# Patient Record
Sex: Female | Born: 1935 | Race: Black or African American | Hispanic: No | Smoking: Never smoker
Health system: Southern US, Community
[De-identification: ages and names within clinical notes are randomized; demographics above are authoritative.]

## PROBLEM LIST (undated history)

## (undated) DIAGNOSIS — J9692 Respiratory failure, unspecified with hypercapnia: Secondary | ICD-10-CM

## (undated) DIAGNOSIS — E785 Hyperlipidemia, unspecified: Secondary | ICD-10-CM

## (undated) DIAGNOSIS — E538 Deficiency of other specified B group vitamins: Secondary | ICD-10-CM

## (undated) DIAGNOSIS — R41841 Cognitive communication deficit: Secondary | ICD-10-CM

## (undated) DIAGNOSIS — R278 Other lack of coordination: Secondary | ICD-10-CM

## (undated) DIAGNOSIS — N183 Chronic kidney disease, stage 3 unspecified: Secondary | ICD-10-CM

## (undated) DIAGNOSIS — J9691 Respiratory failure, unspecified with hypoxia: Secondary | ICD-10-CM

## (undated) DIAGNOSIS — I4891 Unspecified atrial fibrillation: Secondary | ICD-10-CM

## (undated) DIAGNOSIS — R2681 Unsteadiness on feet: Secondary | ICD-10-CM

## (undated) DIAGNOSIS — D649 Anemia, unspecified: Secondary | ICD-10-CM

## (undated) DIAGNOSIS — M6281 Muscle weakness (generalized): Secondary | ICD-10-CM

## (undated) DIAGNOSIS — J9600 Acute respiratory failure, unspecified whether with hypoxia or hypercapnia: Secondary | ICD-10-CM

## (undated) DIAGNOSIS — R1310 Dysphagia, unspecified: Secondary | ICD-10-CM

## (undated) DIAGNOSIS — I10 Essential (primary) hypertension: Secondary | ICD-10-CM

## (undated) DIAGNOSIS — I5033 Acute on chronic diastolic (congestive) heart failure: Secondary | ICD-10-CM

## (undated) DIAGNOSIS — E559 Vitamin D deficiency, unspecified: Secondary | ICD-10-CM

## (undated) HISTORY — PX: COLOSTOMY: SHX63

---

## 2016-01-08 ENCOUNTER — Emergency Department (HOSPITAL_BASED_OUTPATIENT_CLINIC_OR_DEPARTMENT_OTHER): Payer: Medicare Other

## 2016-01-08 ENCOUNTER — Emergency Department (HOSPITAL_BASED_OUTPATIENT_CLINIC_OR_DEPARTMENT_OTHER)
Admission: EM | Admit: 2016-01-08 | Discharge: 2016-01-08 | Disposition: A | Discharge status: Home / Self Care | Payer: Medicare Other | Attending: Emergency Medicine | Admitting: Emergency Medicine

## 2016-01-08 ENCOUNTER — Encounter (HOSPITAL_BASED_OUTPATIENT_CLINIC_OR_DEPARTMENT_OTHER): Payer: Self-pay | Admitting: *Deleted

## 2016-01-08 DIAGNOSIS — Y999 Unspecified external cause status: Secondary | ICD-10-CM | POA: Insufficient documentation

## 2016-01-08 DIAGNOSIS — S50311A Abrasion of right elbow, initial encounter: Secondary | ICD-10-CM | POA: Diagnosis not present

## 2016-01-08 DIAGNOSIS — Y929 Unspecified place or not applicable: Secondary | ICD-10-CM | POA: Insufficient documentation

## 2016-01-08 DIAGNOSIS — Y9301 Activity, walking, marching and hiking: Secondary | ICD-10-CM | POA: Insufficient documentation

## 2016-01-08 DIAGNOSIS — R51 Headache: Secondary | ICD-10-CM | POA: Diagnosis not present

## 2016-01-08 DIAGNOSIS — W19XXXA Unspecified fall, initial encounter: Secondary | ICD-10-CM

## 2016-01-08 DIAGNOSIS — R52 Pain, unspecified: Secondary | ICD-10-CM

## 2016-01-08 DIAGNOSIS — M25552 Pain in left hip: Secondary | ICD-10-CM | POA: Diagnosis not present

## 2016-01-08 DIAGNOSIS — I10 Essential (primary) hypertension: Secondary | ICD-10-CM | POA: Insufficient documentation

## 2016-01-08 DIAGNOSIS — W1839XA Other fall on same level, initial encounter: Secondary | ICD-10-CM | POA: Diagnosis not present

## 2016-01-08 DIAGNOSIS — Z79899 Other long term (current) drug therapy: Secondary | ICD-10-CM | POA: Insufficient documentation

## 2016-01-08 DIAGNOSIS — I2510 Atherosclerotic heart disease of native coronary artery without angina pectoris: Secondary | ICD-10-CM | POA: Insufficient documentation

## 2016-01-08 DIAGNOSIS — M25521 Pain in right elbow: Secondary | ICD-10-CM | POA: Diagnosis present

## 2016-01-08 HISTORY — DX: Essential (primary) hypertension: I10

## 2016-01-08 LAB — PROTIME-INR
INR: 2.23 — AB (ref 0.00–1.49)
Prothrombin Time: 24.5 seconds — ABNORMAL HIGH (ref 11.6–15.2)

## 2016-01-08 NOTE — ED Notes (Signed)
She lost her balance and fell backward hitting the back of her head on concrete. No loc. She also hit her right elbow. Small abrasion and pain.

## 2016-01-08 NOTE — ED Notes (Signed)
Pt didn't make it far out of the room without her sats dropping to high 70's/low 80's. Pulse jumped up to around 120's as well.

## 2016-01-08 NOTE — ED Notes (Signed)
Patient transported to CT and xray 

## 2016-01-08 NOTE — ED Provider Notes (Signed)
CSN: [id number]     Arrival date & time 01/08/16  1737 History  By signing my name below, I, Emily Elliott, attest that this documentation has been prepared under the direction and in the presence of Emily Memos, MD. Electronically Signed: Bridgette Elliott, ED Scribe. 01/08/2016. 6:50 PM.   Chief Complaint  Patient presents with  . Fall   The history is provided by the patient and a relative. No language interpreter was used.    HPI Comments: Emily Elliott is a 82 y.o. female who presents to the Emergency Department complaining of sudden onset, constant, right elbow pain s/p mechanical fall around 5 pm today. Per relative, patient was walking with her walker when she lost balance and fell backwards onto concrete. Patient hit the back of her head and her right elbow upon falling. No LOC. She was having an occiput headache after the fall that has since resolved. No alleviating factors noted. Pt also reports that her left hip hurts but no more than baseline. Pt is on Coumadin for A-fib. No additional injuries from the fall. Denies neck pain, back pain, or any other associated symptoms.   Past Medical History  Diagnosis Date  . Hypertension   . Coronary artery disease    History reviewed. No pertinent past surgical history. No family history on file. Social History  Substance Use Topics  . Smoking status: Never Smoker   . Smokeless tobacco: None  . Alcohol Use: No   OB History    No data available     Review of Systems  Musculoskeletal: Positive for myalgias (hip (baseline)) and arthralgias (right elbow). Negative for back pain and neck pain.  Neurological: Positive for headaches (resolved). Negative for syncope.  All other systems reviewed and are negative.   Allergies  Benadryl  Home Medications   Prior to Admission medications   Medication Sig Start Date End Date Taking? Authorizing Provider  AMLODIPINE BESYLATE PO Take by mouth.   Yes Historical Provider, MD  Furosemide (LASIX PO)  Take by mouth.   Yes Historical Provider, MD  LOSARTAN POTASSIUM PO Take by mouth.   Yes Historical Provider, MD  SIMVASTATIN PO Take by mouth.   Yes Historical Provider, MD  Warfarin Sodium (COUMADIN PO) Take by mouth.   Yes Historical Provider, MD   BP 149/83 mmHg  Pulse 97  Temp(Src) 99.3 F (37.4 C) (Oral)  Resp 18  Ht 5\' 3"  (1.6 m)  Wt 145 lb (65.772 kg)  BMI 25.69 kg/m2  SpO2 95%   Physical Exam  Constitutional: She is oriented to person, place, and time. She appears well-developed and well-nourished.  HENT:  Head: Normocephalic and atraumatic.  No step-offs or ecchymosis  Eyes: Conjunctivae are normal. Pupils are equal, round, and reactive to light.  Cardiovascular: Normal rate.   Pulmonary/Chest: Effort normal and breath sounds normal. No respiratory distress. She exhibits no tenderness.  Abdominal: She exhibits no distension.  Musculoskeletal: Normal range of motion. She exhibits tenderness. She exhibits no edema.       Cervical back: She exhibits no tenderness.  TTP on right elbow. Left hip TTP w/ ROM. No tenderness on rest of her bilateral upper and lower extremities. Small abrasion on right elbow.  Neurological: She is alert and oriented to person, place, and time. She has normal reflexes. No cranial nerve deficit.  Cranial nerves intact. Good sensation in distal lower extremities. 5/5 motor strength in upper extremities and normal sensation. Good sensation with dorsiflexion and plantarflexion of both feet. 1+  patellar deep tender reflexes, symmetric. 2+ bicep DTRs that are symmetric.   Skin: Skin is warm and dry.  Psychiatric: She has a normal mood and affect. Her behavior is normal.  Nursing note and vitals reviewed.   ED Course  Procedures (including critical care time) DIAGNOSTIC STUDIES: Oxygen Saturation is 100% on RA, normal by my interpretation.    COORDINATION OF CARE: 6:25 PM Discussed treatment plan with pt at bedside which includes head CT and x-ray and  pt agreed to plan.  Labs Review Labs Reviewed  PROTIME-INR - Abnormal; Notable for the following:    Prothrombin Time 24.5 (*)    INR 2.23 (*)    All other components within normal limits    Imaging Review Dg Pelvis 1-2 Views  01/08/2016  CLINICAL DATA:  Fall left hip pain. EXAM: PELVIS - 1-2 VIEW COMPARISON:  None. FINDINGS: No acute bony abnormality. Specifically, no fracture, subluxation, or dislocation. Soft tissues are intact. IMPRESSION: No acute bony abnormality. Electronically Signed   By: Charlett Nose M.D.   On: 01/08/2016 19:34   Dg Elbow Complete Right  01/08/2016  CLINICAL DATA:  Pain EXAM: RIGHT ELBOW - COMPLETE 3+ VIEW COMPARISON:  None. FINDINGS: Mild spurring within the right elbow. No acute bony abnormality. Specifically, no fracture, subluxation, or dislocation. Soft tissues are intact. No joint effusion IMPRESSION: No acute bony abnormality. Electronically Signed   By: Charlett Nose M.D.   On: 01/08/2016 19:33   Ct Head Wo Contrast  01/08/2016  CLINICAL DATA:  Pain following fall EXAM: CT HEAD WITHOUT CONTRAST TECHNIQUE: Contiguous axial images were obtained from the base of the skull through the vertex without intravenous contrast. COMPARISON:  None. FINDINGS: There is mild diffuse atrophy. There is no intracranial mass, hemorrhage, extra-axial fluid collection, or midline shift. There is small vessel disease in the centra semiovale bilaterally. There is also small vessel disease in the internal and external capsules bilaterally. No acute infarct is evident. Calcification is noted in the basal ganglia regions, physiologic in this age group. The bony calvarium appears intact. Visualized mastoid air cells are clear. Visualized orbits appear symmetric bilaterally. IMPRESSION: Atrophy with patchy supratentorial small vessel disease. No acute infarct evident. No intracranial mass, hemorrhage, or extra-axial fluid collection. Electronically Signed   By: Bretta Bang III M.D.   On:  01/08/2016 18:54   I have personally reviewed and evaluated these images and lab results as part of my medical decision-making.   MDM   Final diagnoses:  Fall, initial encounter    Mechanical fall with right elbow pain and left hip pain and initially having a headache but that has since resolved. However since she is on Coumadin CT of her head was done x-rays of pelvis and elbow were negative. INR checked per her son's request and this was normal. Patient able to pain. She did have a little bit of shortness of breath with slightly low saturations however this is her baseline per her family. She will follow up with primary doctor for further management. Return here for any new or worsening symptoms.  New Prescriptions: Discharge Medication List as of 01/08/2016  8:56 PM       I have personally and contemperaneously reviewed labs and imaging and used in my decision making as above.   A medical screening exam was performed and I feel the patient has had an appropriate workup for their chief complaint at this time and likelihood of emergent condition existing is low and thus workup can continue  on an outpatient basis.. Their vital signs are stable. They have been counseled on decision, discharge, follow up and which symptoms necessitate immediate return to the emergency department.  They verbally stated understanding and agreement with plan and discharged in stable condition.    I personally performed the services described in this documentation, which was scribed in my presence. The recorded information has been reviewed and is accurate.    Emily MemosJason Xandra Laramee, MD 01/08/16 [phone]

## 2016-05-10 ENCOUNTER — Ambulatory Visit (INDEPENDENT_AMBULATORY_CARE_PROVIDER_SITE_OTHER): Payer: Medicare Other | Admitting: Podiatry

## 2016-05-10 ENCOUNTER — Encounter: Payer: Self-pay | Admitting: Podiatry

## 2016-05-10 VITALS — BP 132/77 | HR 89 | Ht 64.0 in | Wt 149.0 lb

## 2016-05-10 DIAGNOSIS — M79673 Pain in unspecified foot: Secondary | ICD-10-CM | POA: Diagnosis not present

## 2016-05-10 DIAGNOSIS — M2040 Other hammer toe(s) (acquired), unspecified foot: Secondary | ICD-10-CM

## 2016-05-10 DIAGNOSIS — L97501 Non-pressure chronic ulcer of other part of unspecified foot limited to breakdown of skin: Secondary | ICD-10-CM | POA: Diagnosis not present

## 2016-05-10 DIAGNOSIS — M79671 Pain in right foot: Secondary | ICD-10-CM

## 2016-05-10 DIAGNOSIS — I739 Peripheral vascular disease, unspecified: Secondary | ICD-10-CM | POA: Diagnosis not present

## 2016-05-10 DIAGNOSIS — M79672 Pain in left foot: Secondary | ICD-10-CM

## 2016-05-10 DIAGNOSIS — B351 Tinea unguium: Secondary | ICD-10-CM

## 2016-05-10 NOTE — Patient Instructions (Signed)
Seen for painful corns and nails. Debrided and padded on 2nd toe left. May benefit from make opening to toe box in tennis shoes. Use antifungal cream after each bath, follow with Tinactin foot powder and cotton ball in between all affected digits. Return as needed.

## 2016-05-10 NOTE — Progress Notes (Signed)
Heart problem SUBJECTIVE: 82 y.o. year old female presents via wheel chair accompanied by her daughter with painful toes on both feet.   REVIEW OF SYSTEMS: A comprehensive review of systems was negative except for: Heart problem and wears colostomy bag.   OBJECTIVE: DERMATOLOGIC EXAMINATION: Thick dystrophic nails x 10.  Painful digital corn on 2nd digit left.  VASCULAR EXAMINATION OF LOWER LIMBS: PT and DP are not palpable on both feet.  Mild lower limb edema bilateral. Ischemic changes in both feet.   NEUROLOGIC EXAMINATION OF THE LOWER LIMBS: Decreased sensory perception on monofilament sensory testing bilateral. Vibratory sensations(128Hz  turning fork) intact at medial and lateral forefoot bilateral.  Sharp and Dull discriminatory sensations at the plantar ball of hallux is intact bilateral.   MUSCULOSKELETAL EXAMINATION: Medial deviation of 2nd MPJ bilateral. Positive for severely contracted and varus rotated 2nd digit crossing on top of the great toe bilateral. Bunion deformity with hallux valgus on both feet.  ASSESSMENT: Ulcer 2nd toe PIPJ dorsum, limited breakdown to skin without active erythema or edema. Severely subluxed 2nd MPJ bilateral.  Overlapping 2nd digit riding on top of the first digit causing digital lesion at dorsal PIPJ. Dystrophic nails x 10. PVD, Compromised neurovascular status bilateral.  PLAN: Reviewed findings and available options, shoe modification. All digital lesions debrided. 2nd digit left wrapped with aperture pad.  Patient was in too much pain on 2nd to left to do thorough debridement.  All nails debrided. Reviewed shoe modification. Return in 3 months or sooner if needed.

## 2016-08-17 ENCOUNTER — Ambulatory Visit: Payer: Medicare Other | Admitting: Podiatry

## 2016-10-26 ENCOUNTER — Ambulatory Visit: Payer: Medicare Other | Admitting: Podiatry

## 2016-11-22 ENCOUNTER — Ambulatory Visit (INDEPENDENT_AMBULATORY_CARE_PROVIDER_SITE_OTHER): Payer: Medicare Other | Admitting: Podiatry

## 2016-11-22 ENCOUNTER — Encounter: Payer: Self-pay | Admitting: Podiatry

## 2016-11-22 DIAGNOSIS — M79671 Pain in right foot: Secondary | ICD-10-CM

## 2016-11-22 DIAGNOSIS — M2040 Other hammer toe(s) (acquired), unspecified foot: Secondary | ICD-10-CM | POA: Diagnosis not present

## 2016-11-22 DIAGNOSIS — I739 Peripheral vascular disease, unspecified: Secondary | ICD-10-CM | POA: Diagnosis not present

## 2016-11-22 DIAGNOSIS — B351 Tinea unguium: Secondary | ICD-10-CM | POA: Diagnosis not present

## 2016-11-22 DIAGNOSIS — M79672 Pain in left foot: Secondary | ICD-10-CM

## 2016-11-22 NOTE — Progress Notes (Signed)
SUBJECTIVE: 83 y.o. year old female presents via wheel chair accompanied by her daughter with painful toes on both feet.   OBJECTIVE: DERMATOLOGIC EXAMINATION: Thick dystrophic nails x 10.  Painful digital corn on 2nd digit left.  VASCULAR EXAMINATION OF LOWER LIMBS: PT and DP are not palpable on both feet.  Ischemic changes in both feet.   NEUROLOGIC EXAMINATION OF THE LOWER LIMBS: Decreased sensory perception on monofilament sensory testing bilateral. Vibratory sensations(128Hz  turning fork) intact at medial and lateral forefoot bilateral.  Sharp and Dull discriminatory sensations at the plantar ball of hallux is intact bilateral.   MUSCULOSKELETAL EXAMINATION: Medial deviation of 2nd MPJ bilateral. Positive for severely contracted and varus rotated 2nd digit crossing on top of the great toe bilateral. Bunion deformity with hallux valgus on both feet.  ASSESSMENT: Painful corn 2nd toe PIPJ dorsum left, limited breakdown to skin without active erythema or edema. Severely subluxed 2nd MPJ bilateral.  Overlapping 2nd digit riding on top of the first digit causing digital lesion at dorsal PIPJ. Dystrophic nails x 10. PVD, Compromised neurovascular status bilateral.  PLAN: Reviewed findings and available options, shoe modification. Corn on 2nd digit left debrided. All nails debrided and grinded. Return in 3 months or sooner if needed.

## 2016-11-22 NOTE — Patient Instructions (Signed)
Seen for hypertrophic nails. All nails debrided and grinded. Return in 3 months or as needed.  

## 2017-03-13 ENCOUNTER — Inpatient Hospital Stay (HOSPITAL_COMMUNITY)
Admission: EM | Admit: 2017-03-13 | Discharge: 2017-03-21 | DRG: 291 | Disposition: A | Discharge status: Skilled Nursing Facility | Payer: Medicare Other | Attending: Internal Medicine | Admitting: Internal Medicine

## 2017-03-13 ENCOUNTER — Encounter (HOSPITAL_COMMUNITY): Payer: Self-pay

## 2017-03-13 ENCOUNTER — Emergency Department (HOSPITAL_COMMUNITY): Payer: Medicare Other

## 2017-03-13 DIAGNOSIS — J90 Pleural effusion, not elsewhere classified: Secondary | ICD-10-CM | POA: Diagnosis not present

## 2017-03-13 DIAGNOSIS — I130 Hypertensive heart and chronic kidney disease with heart failure and stage 1 through stage 4 chronic kidney disease, or unspecified chronic kidney disease: Principal | ICD-10-CM | POA: Diagnosis present

## 2017-03-13 DIAGNOSIS — N390 Urinary tract infection, site not specified: Secondary | ICD-10-CM | POA: Diagnosis present

## 2017-03-13 DIAGNOSIS — I5033 Acute on chronic diastolic (congestive) heart failure: Secondary | ICD-10-CM | POA: Diagnosis present

## 2017-03-13 DIAGNOSIS — R1310 Dysphagia, unspecified: Secondary | ICD-10-CM

## 2017-03-13 DIAGNOSIS — N183 Chronic kidney disease, stage 3 unspecified: Secondary | ICD-10-CM | POA: Diagnosis present

## 2017-03-13 DIAGNOSIS — Z888 Allergy status to other drugs, medicaments and biological substances status: Secondary | ICD-10-CM

## 2017-03-13 DIAGNOSIS — I482 Chronic atrial fibrillation, unspecified: Secondary | ICD-10-CM | POA: Diagnosis present

## 2017-03-13 DIAGNOSIS — J189 Pneumonia, unspecified organism: Secondary | ICD-10-CM

## 2017-03-13 DIAGNOSIS — G9340 Encephalopathy, unspecified: Secondary | ICD-10-CM | POA: Diagnosis not present

## 2017-03-13 DIAGNOSIS — B9689 Other specified bacterial agents as the cause of diseases classified elsewhere: Secondary | ICD-10-CM | POA: Diagnosis present

## 2017-03-13 DIAGNOSIS — R300 Dysuria: Secondary | ICD-10-CM | POA: Diagnosis present

## 2017-03-13 DIAGNOSIS — G9341 Metabolic encephalopathy: Secondary | ICD-10-CM | POA: Diagnosis present

## 2017-03-13 DIAGNOSIS — E870 Hyperosmolality and hypernatremia: Secondary | ICD-10-CM | POA: Diagnosis not present

## 2017-03-13 DIAGNOSIS — I361 Nonrheumatic tricuspid (valve) insufficiency: Secondary | ICD-10-CM | POA: Diagnosis not present

## 2017-03-13 DIAGNOSIS — R4189 Other symptoms and signs involving cognitive functions and awareness: Secondary | ICD-10-CM | POA: Diagnosis present

## 2017-03-13 DIAGNOSIS — N179 Acute kidney failure, unspecified: Secondary | ICD-10-CM | POA: Diagnosis present

## 2017-03-13 DIAGNOSIS — I248 Other forms of acute ischemic heart disease: Secondary | ICD-10-CM | POA: Diagnosis present

## 2017-03-13 DIAGNOSIS — E872 Acidosis: Secondary | ICD-10-CM | POA: Diagnosis present

## 2017-03-13 DIAGNOSIS — R1319 Other dysphagia: Secondary | ICD-10-CM | POA: Diagnosis not present

## 2017-03-13 DIAGNOSIS — D631 Anemia in chronic kidney disease: Secondary | ICD-10-CM | POA: Diagnosis present

## 2017-03-13 DIAGNOSIS — I509 Heart failure, unspecified: Secondary | ICD-10-CM

## 2017-03-13 DIAGNOSIS — I5031 Acute diastolic (congestive) heart failure: Secondary | ICD-10-CM | POA: Diagnosis not present

## 2017-03-13 DIAGNOSIS — I878 Other specified disorders of veins: Secondary | ICD-10-CM | POA: Diagnosis present

## 2017-03-13 DIAGNOSIS — J9602 Acute respiratory failure with hypercapnia: Secondary | ICD-10-CM | POA: Diagnosis present

## 2017-03-13 DIAGNOSIS — R8271 Bacteriuria: Secondary | ICD-10-CM | POA: Diagnosis present

## 2017-03-13 DIAGNOSIS — I10 Essential (primary) hypertension: Secondary | ICD-10-CM | POA: Diagnosis not present

## 2017-03-13 DIAGNOSIS — I272 Pulmonary hypertension, unspecified: Secondary | ICD-10-CM | POA: Diagnosis present

## 2017-03-13 DIAGNOSIS — Z933 Colostomy status: Secondary | ICD-10-CM

## 2017-03-13 DIAGNOSIS — Z9889 Other specified postprocedural states: Secondary | ICD-10-CM

## 2017-03-13 DIAGNOSIS — D689 Coagulation defect, unspecified: Secondary | ICD-10-CM | POA: Diagnosis not present

## 2017-03-13 DIAGNOSIS — Z7901 Long term (current) use of anticoagulants: Secondary | ICD-10-CM | POA: Diagnosis not present

## 2017-03-13 DIAGNOSIS — I2510 Atherosclerotic heart disease of native coronary artery without angina pectoris: Secondary | ICD-10-CM | POA: Diagnosis present

## 2017-03-13 DIAGNOSIS — R1312 Dysphagia, oropharyngeal phase: Secondary | ICD-10-CM | POA: Diagnosis present

## 2017-03-13 DIAGNOSIS — J9601 Acute respiratory failure with hypoxia: Secondary | ICD-10-CM | POA: Diagnosis present

## 2017-03-13 DIAGNOSIS — E669 Obesity, unspecified: Secondary | ICD-10-CM | POA: Diagnosis present

## 2017-03-13 DIAGNOSIS — R627 Adult failure to thrive: Secondary | ICD-10-CM | POA: Diagnosis present

## 2017-03-13 DIAGNOSIS — Z85038 Personal history of other malignant neoplasm of large intestine: Secondary | ICD-10-CM | POA: Diagnosis present

## 2017-03-13 DIAGNOSIS — Z6828 Body mass index (BMI) 28.0-28.9, adult: Secondary | ICD-10-CM

## 2017-03-13 HISTORY — DX: Unspecified atrial fibrillation: I48.91

## 2017-03-13 LAB — URINALYSIS, ROUTINE W REFLEX MICROSCOPIC
BILIRUBIN URINE: NEGATIVE
Glucose, UA: NEGATIVE mg/dL
Hgb urine dipstick: NEGATIVE
Ketones, ur: NEGATIVE mg/dL
Nitrite: NEGATIVE
PH: 6 (ref 5.0–8.0)
Protein, ur: 100 mg/dL — AB
SPECIFIC GRAVITY, URINE: 1.016 (ref 1.005–1.030)

## 2017-03-13 LAB — I-STAT TROPONIN, ED: TROPONIN I, POC: 0.08 ng/mL (ref 0.00–0.08)

## 2017-03-13 LAB — BLOOD GAS, ARTERIAL
ACID-BASE EXCESS: 4.1 mmol/L — AB (ref 0.0–2.0)
BICARBONATE: 31.3 mmol/L — AB (ref 20.0–28.0)
Delivery systems: POSITIVE
Drawn by: 11249
EXPIRATORY PAP: 5
FIO2: 40
INSPIRATORY PAP: 20
LHR: 8 {breaths}/min
MODE: POSITIVE
O2 SAT: 98.6 %
PATIENT TEMPERATURE: 97.6
PCO2 ART: 63.8 mmHg — AB (ref 32.0–48.0)
PH ART: 7.309 — AB (ref 7.350–7.450)
pO2, Arterial: 123 mmHg — ABNORMAL HIGH (ref 83.0–108.0)

## 2017-03-13 LAB — COMPREHENSIVE METABOLIC PANEL
ALBUMIN: 3.4 g/dL — AB (ref 3.5–5.0)
ALT: 11 U/L — ABNORMAL LOW (ref 14–54)
ANION GAP: 7 (ref 5–15)
AST: 23 U/L (ref 15–41)
Alkaline Phosphatase: 51 U/L (ref 38–126)
BILIRUBIN TOTAL: 0.9 mg/dL (ref 0.3–1.2)
BUN: 47 mg/dL — ABNORMAL HIGH (ref 6–20)
CALCIUM: 8.2 mg/dL — AB (ref 8.9–10.3)
CO2: 34 mmol/L — AB (ref 22–32)
Chloride: 110 mmol/L (ref 101–111)
Creatinine, Ser: 1.59 mg/dL — ABNORMAL HIGH (ref 0.44–1.00)
GFR calc non Af Amer: 27 mL/min — ABNORMAL LOW (ref >60)
GFR, EST AFRICAN AMERICAN: 31 mL/min — AB (ref >60)
GLUCOSE: 115 mg/dL — AB (ref 65–99)
POTASSIUM: 3.6 mmol/L (ref 3.5–5.1)
SODIUM: 151 mmol/L — AB (ref 135–145)
TOTAL PROTEIN: 7.1 g/dL (ref 6.5–8.1)

## 2017-03-13 LAB — BLOOD GAS, VENOUS
Acid-Base Excess: 4.3 mmol/L — ABNORMAL HIGH (ref 0.0–2.0)
BICARBONATE: 33.2 mmol/L — AB (ref 20.0–28.0)
FIO2: 21
O2 Saturation: 72.8 %
PATIENT TEMPERATURE: 98.6
PH VEN: 7.237 — AB (ref 7.250–7.430)
pCO2, Ven: 80.8 mmHg (ref 44.0–60.0)
pO2, Ven: 49.2 mmHg — ABNORMAL HIGH (ref 32.0–45.0)

## 2017-03-13 LAB — CBC WITH DIFFERENTIAL/PLATELET
BASOS PCT: 0 %
Basophils Absolute: 0 10*3/uL (ref 0.0–0.1)
EOS ABS: 0.1 10*3/uL (ref 0.0–0.7)
EOS PCT: 1 %
HCT: 37.1 % (ref 36.0–46.0)
Hemoglobin: 10.9 g/dL — ABNORMAL LOW (ref 12.0–15.0)
LYMPHS ABS: 0.7 10*3/uL (ref 0.7–4.0)
Lymphocytes Relative: 10 %
MCH: 28.1 pg (ref 26.0–34.0)
MCHC: 29.4 g/dL — AB (ref 30.0–36.0)
MCV: 95.6 fL (ref 78.0–100.0)
MONO ABS: 0.6 10*3/uL (ref 0.1–1.0)
MONOS PCT: 8 %
Neutro Abs: 6 10*3/uL (ref 1.7–7.7)
Neutrophils Relative %: 81 %
Platelets: 280 10*3/uL (ref 150–400)
RBC: 3.88 MIL/uL (ref 3.87–5.11)
RDW: 19.6 % — AB (ref 11.5–15.5)
WBC: 7.3 10*3/uL (ref 4.0–10.5)

## 2017-03-13 LAB — I-STAT CG4 LACTIC ACID, ED: Lactic Acid, Venous: 1.37 mmol/L (ref 0.5–1.9)

## 2017-03-13 LAB — PROTIME-INR
INR: 6.2
PROTHROMBIN TIME: 56.8 s — AB (ref 11.4–15.2)

## 2017-03-13 LAB — BRAIN NATRIURETIC PEPTIDE: B NATRIURETIC PEPTIDE 5: 761.6 pg/mL — AB (ref 0.0–100.0)

## 2017-03-13 MED ORDER — ACETAMINOPHEN 325 MG PO TABS
650.0000 mg | ORAL_TABLET | Freq: Four times a day (QID) | ORAL | Status: DC | PRN
  Administered 2017-03-20: 650 mg via ORAL
  Filled 2017-03-13: qty 2

## 2017-03-13 MED ORDER — METOPROLOL SUCCINATE ER 50 MG PO TB24
50.0000 mg | ORAL_TABLET | Freq: Two times a day (BID) | ORAL | Status: DC
  Filled 2017-03-13: qty 1

## 2017-03-13 MED ORDER — CEFTRIAXONE SODIUM 2 G IJ SOLR
2.0000 g | Freq: Once | INTRAMUSCULAR | Status: AC
  Administered 2017-03-13: 2 g via INTRAVENOUS
  Filled 2017-03-13: qty 2

## 2017-03-13 MED ORDER — DILTIAZEM HCL ER COATED BEADS 120 MG PO CP24: 120.0000 mg | ORAL_CAPSULE | Freq: Every day | ORAL | Status: DC

## 2017-03-13 MED ORDER — IPRATROPIUM-ALBUTEROL 0.5-2.5 (3) MG/3ML IN SOLN
3.0000 mL | Freq: Once | RESPIRATORY_TRACT | Status: AC
  Administered 2017-03-13: 3 mL via RESPIRATORY_TRACT
  Filled 2017-03-13: qty 3

## 2017-03-13 MED ORDER — SODIUM CHLORIDE 0.9 % IV SOLN: 250.0000 mL | INTRAVENOUS | Status: DC | PRN

## 2017-03-13 MED ORDER — HYDRALAZINE HCL 20 MG/ML IJ SOLN: 10.0000 mg | INTRAMUSCULAR | Status: DC | PRN

## 2017-03-13 MED ORDER — SODIUM CHLORIDE 0.9% FLUSH
3.0000 mL | Freq: Two times a day (BID) | INTRAVENOUS | Status: DC
  Administered 2017-03-13 – 2017-03-20 (×9): 3 mL via INTRAVENOUS

## 2017-03-13 MED ORDER — ONDANSETRON HCL 4 MG PO TABS: 4.0000 mg | ORAL_TABLET | Freq: Four times a day (QID) | ORAL | Status: DC | PRN

## 2017-03-13 MED ORDER — CYANOCOBALAMIN 500 MCG PO TABS
500.0000 ug | ORAL_TABLET | Freq: Every day | ORAL | Status: DC
  Administered 2017-03-15 – 2017-03-21 (×7): 500 ug via ORAL
  Filled 2017-03-13 (×8): qty 1

## 2017-03-13 MED ORDER — VITAMIN D 1000 UNITS PO TABS: 5000.0000 [IU] | ORAL_TABLET | Freq: Every day | ORAL | Status: DC

## 2017-03-13 MED ORDER — SODIUM CHLORIDE 0.9 % IV SOLN: Freq: Once | INTRAVENOUS | Status: DC

## 2017-03-13 MED ORDER — HYDROCHLOROTHIAZIDE 12.5 MG PO CAPS: 12.5000 mg | ORAL_CAPSULE | Freq: Every day | ORAL | Status: DC

## 2017-03-13 MED ORDER — ATORVASTATIN CALCIUM 10 MG PO TABS
10.0000 mg | ORAL_TABLET | Freq: Every day | ORAL | Status: DC
  Administered 2017-03-14 – 2017-03-20 (×7): 10 mg via ORAL
  Filled 2017-03-13 (×7): qty 1

## 2017-03-13 MED ORDER — ONDANSETRON HCL 4 MG/2ML IJ SOLN: 4.0000 mg | Freq: Four times a day (QID) | INTRAMUSCULAR | Status: DC | PRN

## 2017-03-13 MED ORDER — DEXTROSE 5 % IV SOLN
1.0000 g | INTRAVENOUS | Status: DC
  Administered 2017-03-14 – 2017-03-20 (×7): 1 g via INTRAVENOUS
  Filled 2017-03-13 (×8): qty 1

## 2017-03-13 MED ORDER — ACETAMINOPHEN 650 MG RE SUPP
650.0000 mg | Freq: Four times a day (QID) | RECTAL | Status: DC | PRN
Start: 2017-03-13 — End: 2017-03-21

## 2017-03-13 MED ORDER — FERROUS SULFATE 325 (65 FE) MG PO TABS: 325.0000 mg | ORAL_TABLET | Freq: Every day | ORAL | Status: DC

## 2017-03-13 MED ORDER — DEXTROSE 5 % IV SOLN
500.0000 mg | Freq: Once | INTRAVENOUS | Status: AC
  Administered 2017-03-13: 500 mg via INTRAVENOUS
  Filled 2017-03-13: qty 500

## 2017-03-13 MED ORDER — SODIUM CHLORIDE 0.9% FLUSH: 3.0000 mL | INTRAVENOUS | Status: DC | PRN

## 2017-03-13 MED ORDER — MAGNESIUM OXIDE 400 (241.3 MG) MG PO TABS: 200.0000 mg | ORAL_TABLET | Freq: Every day | ORAL | Status: DC

## 2017-03-13 NOTE — Progress Notes (Signed)
PHARMACY NOTE:  ANTIMICROBIAL RENAL DOSAGE ADJUSTMENT  Current antimicrobial regimen includes a mismatch between antimicrobial dosage and estimated renal function.  As per policy approved by the Pharmacy & Therapeutics and Medical Executive Committees, the antimicrobial dosage will be adjusted accordingly.  Current antimicrobial dosage:  Cefepime 1 Gm IV q8h  Indication: UTI with concern for PNA  Renal Function:  Estimated Creatinine Clearance: 19.8 mL/min (A) (by C-G formula based on SCr of 1.59 mg/dL (H)). []      On intermittent HD, scheduled: []      On CRRT    Antimicrobial dosage has been changed to:  Cefepime 1 Gm IV q24h    Thank you for allowing pharmacy to be a part of this patient's care.  Lorenza EvangelistGreen, Avon Mergenthaler R, [facility]valde Memorial HospitalRPH 03/13/2017 10:36 PM

## 2017-03-13 NOTE — ED Provider Notes (Signed)
[facility]-EMERGENCY DEPT Provider Note   CSN: [id number] Arrival date & time: 03/13/17  1635     History   Chief Complaint Chief Complaint  Patient presents with  . Dysuria    HPI Emily Elliott is a 83 y.o. female.  HPI   83 yo F here with generalized weakness. Pt reportedly has had increased fatigue for 2-3 weeks. Over the last several days, she has been more drowsy, sleeping more. She has been intermittently confused. She has also had poor appetite, is requiring help eating whereas normally she is ambulatory w/o difficulty, very active for her age. She has also had some mild cough. No headache, no focal numbness or weakness. She states she just feels "ill." Denies any CP. She does endorse some mild cough and SOB.   She went to her PCP today who had UA positive for UTI, was sent to ED for evaluation.  Past Medical History:  Diagnosis Date  . Atrial fibrillation (HCC)   . Coronary artery disease   . Hypertension     Patient Active Problem List   Diagnosis Date Noted  . Acute CHF (congestive heart failure) (HCC) 03/14/2017  . Coagulopathy (HCC) 03/14/2017  . Atrial fibrillation, chronic (HCC) 03/13/2017  . Essential hypertension 03/13/2017  . Hypernatremia 03/13/2017  . AKI (acute kidney injury) (HCC) 03/13/2017  . Acute encephalopathy 03/13/2017  . UTI (urinary tract infection) 03/13/2017  . History of colon cancer 03/13/2017  . Pleural effusion, right 03/13/2017  . Dysphagia 03/13/2017  . CKD (chronic kidney disease), stage III 03/13/2017  . Acute respiratory failure with hypoxia and hypercapnia (HCC)   . Foot pain, bilateral 05/10/2016    Past Surgical History:  Procedure Laterality Date  . COLOSTOMY      OB History    No data available       Home Medications    Prior to Admission medications   Medication Sig Start Date End Date Taking? Authorizing Provider  Ascorbic Acid (VITAMIN C) 1000 MG tablet Take 1,000 mg by mouth daily.   Yes [provider]  atorvastatin (LIPITOR) 10 MG tablet Take 10 mg by mouth every evening.   Yes [provider]  Cholecalciferol (D-3-5) 5000 units capsule Take 5,000 Units by mouth daily.   Yes [provider]  ciprofloxacin (CIPRO) 250 MG tablet Take 250 mg by mouth 2 (two) times daily.   Yes [provider]  COD LIVER OIL/LOW VITAMIN A CAPS Take 1 capsule by mouth daily.   Yes [provider]  diltiazem (CARDIZEM CD) 120 MG 24 hr capsule Take 120 mg by mouth daily.   Yes [provider]  ferrous sulfate 325 (65 FE) MG tablet Take 325 mg by mouth daily with breakfast.   Yes [provider]  furosemide (LASIX) 40 MG tablet Take 40 mg by mouth daily as needed (edema).    Yes [provider]  hydrochlorothiazide (MICROZIDE) 12.5 MG capsule Take 12.5 mg by mouth daily.   Yes [provider]  losartan (COZAAR) 100 MG tablet Take 100 mg by mouth daily.    Yes [provider]  Magnesium 250 MG TABS Take 250 mg by mouth daily.   Yes [provider]  metoprolol succinate (TOPROL-XL) 50 MG 24 hr tablet Take 50 mg by mouth 2 (two) times daily. Take with or immediately following a meal.   Yes [provider]  OVER THE COUNTER MEDICATION Take 15 mLs by mouth daily.   Yes [provider]  vitamin B-12 (CYANOCOBALAMIN) 500 MCG tablet Take 500 mcg by mouth daily.   Yes [provider]  warfarin (COUMADIN) 1 MG tablet Take 1.5 mg by mouth daily.   Yes [provider]  warfarin (COUMADIN) 2 MG tablet Take 2 mg by mouth every Tuesday.     [provider]    Family History History reviewed. No pertinent family history.  Social History Social History  Substance Use Topics  . Smoking status: Never Smoker  . Smokeless tobacco: Never Used  . Alcohol use No     Allergies   Benadryl [diphenhydramine]   Review of Systems Review of Systems  Constitutional: Positive for fatigue.    Respiratory: Positive for cough and shortness of breath.   Genitourinary: Positive for dysuria and frequency.  Neurological: Positive for weakness.  All other systems reviewed and are negative.    Physical Exam Updated Vital Signs BP 131/76   Pulse 79   Temp 97.7 F (36.5 C) (Axillary)   Resp 13   Ht 5\' 3"  (1.6 m)   Wt 72.3 kg (159 lb 6.3 oz)   SpO2 95%   BMI 28.24 kg/m   Physical Exam  Constitutional: She appears well-developed and well-nourished.  Elderly, drowsy  HENT:  Head: Normocephalic and atraumatic.  Mildly dry MM  Eyes: Conjunctivae are normal.  Neck: Neck supple.  Cardiovascular: Normal rate, regular rhythm and normal heart sounds.  Exam reveals no friction rub.   No murmur heard. Pulmonary/Chest: Effort normal and breath sounds normal. No respiratory distress. She has no wheezes. She has no rales.  Abdominal: She exhibits no distension.  Musculoskeletal: She exhibits no edema.  Neurological: She exhibits normal muscle tone.  Drowsy, falls asleep easily but oriented to person, place, time. Face symmetric. Speech is occasionally slurred but not aphasic. MAE with 5/5 strength.  Skin: Skin is warm. Capillary refill takes less than 2 seconds.  Psychiatric: She has a normal mood and affect.  Nursing note and vitals reviewed.    ED Treatments / Results  Labs (all labs ordered are listed, but only abnormal results are displayed) Labs Reviewed  CBC WITH DIFFERENTIAL/PLATELET - Abnormal; Notable for the following:       Result Value   Hemoglobin 10.9 (*)    MCHC 29.4 (*)    RDW 19.6 (*)    All other components within normal limits  COMPREHENSIVE METABOLIC PANEL - Abnormal; Notable for the following:    Sodium 151 (*)    CO2 34 (*)    Glucose, Bld 115 (*)    BUN 47 (*)    Creatinine, Ser 1.59 (*)    Calcium 8.2 (*)    Albumin 3.4 (*)    ALT 11 (*)    GFR calc non Af Amer 27 (*)    GFR calc Af Amer 31 (*)    All other components within normal limits   BRAIN NATRIURETIC PEPTIDE - Abnormal; Notable for the following:    B Natriuretic Peptide 761.6 (*)    All other components within normal limits  URINALYSIS, ROUTINE W REFLEX MICROSCOPIC - Abnormal; Notable for the following:    APPearance HAZY (*)    Protein, ur 100 (*)    Leukocytes, UA LARGE (*)    Bacteria, UA RARE (*)    Squamous Epithelial / LPF 0-5 (*)    All other components within normal limits  PROTIME-INR - Abnormal; Notable for the following:    Prothrombin Time 56.8 (*)    INR 6.20 (*)  All other components within normal limits  BLOOD GAS, VENOUS - Abnormal; Notable for the following:    pH, Ven 7.237 (*)    pCO2, Ven 80.8 (*)    pO2, Ven 49.2 (*)    Bicarbonate 33.2 (*)    Acid-Base Excess 4.3 (*)    All other components within normal limits  BASIC METABOLIC PANEL - Abnormal; Notable for the following:    Sodium 150 (*)    Chloride 112 (*)    BUN 43 (*)    Creatinine, Ser 1.49 (*)    Calcium 7.8 (*)    GFR calc non Af Amer 29 (*)    GFR calc Af Amer 34 (*)    All other components within normal limits  CBC WITH DIFFERENTIAL/PLATELET - Abnormal; Notable for the following:    Hemoglobin 11.4 (*)    MCHC 29.6 (*)    RDW 19.5 (*)    All other components within normal limits  PROTIME-INR - Abnormal; Notable for the following:    Prothrombin Time 53.6 (*)    INR 5.77 (*)    All other components within normal limits  BLOOD GAS, ARTERIAL - Abnormal; Notable for the following:    pH, Arterial 7.309 (*)    pCO2 arterial 63.8 (*)    pO2, Arterial 123 (*)    Bicarbonate 31.3 (*)    Acid-Base Excess 4.1 (*)    All other components within normal limits  MAGNESIUM - Abnormal; Notable for the following:    Magnesium 2.9 (*)    All other components within normal limits  MRSA PCR SCREENING  URINE CULTURE  CULTURE, BLOOD (ROUTINE X 2)  CULTURE, BLOOD (ROUTINE X 2)  AMMONIA  LACTATE DEHYDROGENASE  VITAMIN B12  TSH  I-STAT CG4 LACTIC ACID, ED  I-STAT TROPONIN, ED     EKG  EKG Interpretation  Date/Time:  Monday March 13 2017 17:04:33 EDT Ventricular Rate:  80 PR Interval:    QRS Duration: 93 QT Interval:  398 QTC Calculation: 460 R Axis:   -6 Text Interpretation:  Age not entered, assumed to be  83 years old for purpose of ECG interpretation Atrial fibrillation Low voltage, precordial leads Abnormal T, consider ischemia, lateral leads No old tracing to compare Confirmed by Shaune Pollack [phone]) on 03/14/2017 1:56:48 AM       Radiology Ct Abdomen Pelvis Wo Contrast  Result Date: 03/13/2017 CLINICAL DATA:  Abdominal distention and pain. EXAM: CT ABDOMEN AND PELVIS WITHOUT CONTRAST TECHNIQUE: Multidetector CT imaging of the abdomen and pelvis was performed following the standard protocol without IV contrast. COMPARISON:  None. FINDINGS: Lower chest: Large right pleural effusion is noted with adjacent subsegmental atelectasis. Hepatobiliary: No gallstones are noted. No focal abnormality is noted in the liver. Pancreas: Unremarkable. No pancreatic ductal dilatation or surrounding inflammatory changes. Spleen: Normal in size without focal abnormality. Adrenals/Urinary Tract: Adrenal glands are unremarkable. Large left renal cysts are noted. No hydronephrosis or renal obstruction is noted. Urinary bladder is unremarkable. Stomach/Bowel: Stomach is unremarkable. Colostomy is noted in left lower quadrant. Diverticulosis is noted throughout the colon without inflammation. Vascular/Lymphatic: Aortic atherosclerosis. No enlarged abdominal or pelvic lymph nodes. Reproductive: Uterus and bilateral adnexa are unremarkable. Other: Mild amount of free fluid is noted in the pelvis. Fluid is also noted around the liver and spleen. Musculoskeletal: No acute or significant osseous findings. IMPRESSION: Large right pleural effusion with adjacent subsegmental atelectasis. Large left renal cysts. Colostomy seen in left lower quadrant. Aortic atherosclerosis. Mild ascites is  noted. Electronically Signed   By: Lupita Raider, M.D.   On: 03/13/2017 20:25   Dg Chest 2 View  Result Date: 03/13/2017 CLINICAL DATA:  Hypoxia, possible UTI, history hypertension, coronary artery disease, atrial fibrillation EXAM: CHEST  2 VIEW COMPARISON:  None FINDINGS: Very low lung volumes. Borderline enlargement of cardiac silhouette. Mediastinal contours normal. RIGHT pleural effusion and basilar atelectasis versus infiltrate. Minimal atelectasis at LEFT base. Mild central peribronchial thickening. No pneumothorax. Bones demineralized. IMPRESSION: Bronchitic changes with low lung volumes and mild LEFT basilar atelectasis. RIGHT pleural effusion with atelectasis versus consolidation at lower RIGHT lung. Electronically Signed   By: Ulyses Southward M.D.   On: 03/13/2017 18:15   Ct Head Wo Contrast  Result Date: 03/13/2017 CLINICAL DATA:  Altered level of consciousness. Urinary symptoms for 1 week. History of hypertension and atrial fibrillation. EXAM: CT HEAD WITHOUT CONTRAST TECHNIQUE: Contiguous axial images were obtained from the base of the skull through the vertex without intravenous contrast. COMPARISON:  None. FINDINGS: BRAIN: No intraparenchymal hemorrhage, mass effect nor midline shift. The ventricles and sulci are normal for age. Patchy supratentorial white matter hypodensities within normal range for patient's age, though non-specific are most compatible with chronic small vessel ischemic disease. Old bilateral basal ganglia and thalamus lacunar infarcts. Symmetric basal ganglia mineralization. No acute large vascular territory infarcts. No abnormal extra-axial fluid collections. Basal cisterns are patent. VASCULAR: Moderate calcific atherosclerosis of the carotid siphons. 7 mm LEFT carotid terminus aneurysm. SKULL: No skull fracture. No significant scalp soft tissue swelling. SINUSES/ORBITS: The mastoid air-cells and included paranasal sinuses are well-aerated.The included ocular globes and  orbital contents are non-suspicious. OTHER: None. IMPRESSION: 1. No acute intracranial process. 2. Moderate chronic small vessel ischemic disease and old lacunar infarcts. 3. 7 mm LEFT carotid terminus aneurysm would be better assessed on CT or MR angiogram on nonemergent basis. Electronically Signed   By: Awilda Metro M.D.   On: 03/13/2017 20:22    Procedures .Critical Care Performed by: Shaune Pollack Authorized by: Shaune Pollack     (including critical care time)  CRITICAL CARE Performed by: Dollene Cleveland   Total critical care time: 35 minutes  Critical care time was exclusive of separately billable procedures and treating other patients.  Critical care was necessary to treat or prevent imminent or life-threatening deterioration.  Critical care was time spent personally by me on the following activities: development of treatment plan with patient and/or surrogate as well as nursing, discussions with consultants, evaluation of patient's response to treatment, examination of patient, obtaining history from patient or surrogate, ordering and performing treatments and interventions, ordering and review of laboratory studies, ordering and review of radiographic studies, pulse oximetry and re-evaluation of patient's condition.   Medications Ordered in ED Medications  atorvastatin (LIPITOR) tablet 10 mg (not administered)  cyanocobalamin tablet 500 mcg (500 mcg Oral Not Given 03/14/17 0914)  sodium chloride flush (NS) 0.9 % injection 3 mL (3 mLs Intravenous Given 03/14/17 0915)  sodium chloride flush (NS) 0.9 % injection 3 mL (not administered)  0.9 %  sodium chloride infusion (not administered)  acetaminophen (TYLENOL) tablet 650 mg (not administered)    Or  acetaminophen (TYLENOL) suppository 650 mg (not administered)  ondansetron (ZOFRAN) tablet 4 mg (not administered)    Or  ondansetron (ZOFRAN) injection 4 mg (not administered)  ceFEPIme (MAXIPIME) 1 g in dextrose 5 % 50 mL  IVPB (not administered)  furosemide (LASIX) injection 40 mg (40 mg Intravenous Given 03/14/17 0914)  metoprolol tartrate (  LOPRESSOR) injection 2.5 mg (2.5 mg Intravenous Given 03/14/17 0914)  Warfarin - Pharmacist Dosing Inpatient (0 each Does not apply Hold 03/14/17 1800)  cefTRIAXone (ROCEPHIN) 2 g in dextrose 5 % 50 mL IVPB (0 g Intravenous Stopped 03/13/17 1940)  azithromycin (ZITHROMAX) 500 mg in dextrose 5 % 250 mL IVPB (0 mg Intravenous Stopped 03/13/17 2204)  ipratropium-albuterol (DUONEB) 0.5-2.5 (3) MG/3ML nebulizer solution 3 mL (3 mLs Nebulization Given by Other 03/13/17 2144)     Initial Impression / Assessment and Plan / ED Course  I have reviewed the triage vital signs and the nursing notes.  Pertinent labs & imaging results that were available during my care of the patient were reviewed by me and considered in my medical decision making (see chart for details).     83 yo F here with increased drowsiness, poor PO intake x 2 weeks. On arrival, VSS, pt drowsy but without focal neuro deficits. Lab work remarkable for +UTI, hypernatremia and possible AKI, elevated BNP, and CXR c/w PNA and hypervolemia. VBG also concerning for acute resp failure with hypercapnea. Suspect encephalopathy is multifactorial in setting of UTI, CHF exacerbation, and possible PNA. Pt placed on BIPAP with improvement, will start ABX for UTI/CAP, and hold on fluids given CHF exacerbation. Likely will need IV diuresis. Admit to step down.  Final Clinical Impressions(s) / ED Diagnoses   Final diagnoses:  Lower urinary tract infectious disease  Community acquired pneumonia, unspecified laterality  Encephalopathy    New Prescriptions Current Discharge Medication List       Shaune Pollack, MD 03/14/17 1112

## 2017-03-13 NOTE — ED Triage Notes (Signed)
Pt here from home.  Pt sent by MD for urinary symptoms x 1 week.  Has been taking home abx without relief.  No pain at this time.

## 2017-03-13 NOTE — ED Notes (Signed)
Attempted to call report to primary nurse for 1234. Primary nurse for that assigned room with call back.

## 2017-03-13 NOTE — ED Notes (Signed)
Pt in CT.

## 2017-03-13 NOTE — Progress Notes (Signed)
ANTICOAGULATION CONSULT NOTE   Pharmacy Consult for warfarin Indication: hx atrial fibrillation  Allergies  Allergen Reactions  . Benadryl [Diphenhydramine]     rash    Patient Measurements: Height: 5\' 3"  (160 cm) Weight: 140 lb (63.5 kg) IBW/kg (Calculated) : 52.4 Heparin Dosing Weight:   Vital Signs: Temp: 98 F (36.7 C) (08/06 1933) Temp Source: Oral (08/06 1933) BP: 124/80 (08/06 1933) Pulse Rate: 83 (08/06 1933)  Labs:  Recent Labs  03/13/17 1835  HGB 10.9*  HCT 37.1  PLT 280  CREATININE 1.59*    Estimated Creatinine Clearance: 19.8 mL/min (A) (by C-G formula based on SCr of 1.59 mg/dL (H)).   Medical History: Past Medical History:  Diagnosis Date  . Atrial fibrillation (HCC)   . Coronary artery disease   . Hypertension     Medications:  Home warfarin regimen:1.5 mg daily except 2 mg on Tuesdays  Assessment: Patient's a 83 y.o F with hx afib on warfarin PTA, presented to the ED on 03/13/17 with c/o dysuria.  To resume warfarin while patient is hospitalized.  Of note, patient was on cipro PTA and was instructed by her PCP to hold warfarin (d/t elevated INR secondary to drug-drug intxns) until she has completed her course of cipro (last warfarin dose taken on 8/2, scheduled to resume warfarin back on 03/14/17).   Today, 03/13/2017: - INR is elevated at 6.2 - hgb 10.9, plt ok  Goal of Therapy:  INR 2-3 Monitor platelets by anticoagulation protocol: Yes   Plan:  - hold warfarin for now.  Will f/u with INR and resume when appropriate. - daily INR - monitor for s/s of bleeding  Ayde Record P 03/13/2017,9:23 PM

## 2017-03-13 NOTE — H&P (Signed)
History and Physical    Emily NettleMavis Stacey ZOX:[id number] DOB: July 19, 1921 DOA: 03/13/2017  PCP: Patient, No Pcp Per   Patient coming from: Home  Chief Complaint: Somnolence, confusion, urinary sxs   HPI: Emily NettleMavis Elliott is a 83 y.o. female with medical history significant for atrial fibrillation on warfarin, coronary artery disease, hypertension, chronic kidney disease stage III, chronic CHF, and anemia, now presenting to the emergency department with correction of her PCP for evaluation of 1 week of urinary symptoms despite taking antibiotics at home. Patient has reportedly been more confused and lethargic for the past couple days despite treatment with Cipro for a UTI. She was brought in to the PCP again today. UA reportedly demonstrated persistent bacteriuria with positive nitrites. She was directed to the ED for further evaluation of this. Patient is unable to contribute history secondary to clinical condition. Caregiver at the bedside, and son by phone, assist with the history. No recent fall or trauma. Caregiver at the bedside notes that the patient has been having difficulty swallowing, choking on foods and liquids.  ED Course: Upon arrival to the ED, patient is found to be afebrile, saturating mid 80s on room air, and with vitals otherwise stable. EKG features atrial fibrillation and chest x-ray is most notable for a right pleural effusion with associated atelectasis versus infiltrate. Chemistry panels notable for a sodium of 151 and creatinine 1.59, up from a recent value of 1.29. CBC is notable for a stable normocytic anemia with hemoglobin of 10.9. BNP is elevated to 762. Troponin is within normal limits. Lactic acid is reassuring 1.37. Head CT is negative for acute intracranial abnormality and CT of the abdomen and pelvis is most notable for a large right pleural effusion with associated atelectasis. Blood cultures were obtained in the emergency and the patient was treated with empiric Rocephin and  azithromycin. Blood gas revealed respiratory acidosis and she was started on BiPAP. She remained hemodynamically stable. Patient will be admitted to the stepdown unit for ongoing evaluation and management of UTI despite outpatient antibiotics, now with acute encephalopathy and acute hypercarbic/hypoxic respiratory failure secondary to large right pleural effusion and possible aspiration.  Review of Systems:  Unable to obtain secondary to the patient's clinical condition.   Past Medical History:  Diagnosis Date  . Atrial fibrillation (HCC)   . Coronary artery disease   . Hypertension     Past Surgical History:  Procedure Laterality Date  . COLOSTOMY       reports that she has never smoked. She has never used smokeless tobacco. She reports that she does not drink alcohol or use drugs.  Allergies  Allergen Reactions  . Benadryl [Diphenhydramine]     rash    History reviewed. No pertinent family history.   Prior to Admission medications   Medication Sig Start Date End Date Taking? Authorizing Provider  Ascorbic Acid (VITAMIN C) 1000 MG tablet Take 1,000 mg by mouth daily.   Yes [provider]  atorvastatin (LIPITOR) 10 MG tablet Take 10 mg by mouth every evening.   Yes [provider]  Cholecalciferol (D-3-5) 5000 units capsule Take 5,000 Units by mouth daily.   Yes [provider]  ciprofloxacin (CIPRO) 250 MG tablet Take 250 mg by mouth 2 (two) times daily.   Yes [provider]  COD LIVER OIL/LOW VITAMIN A CAPS Take 1 capsule by mouth daily.   Yes [provider]  diltiazem (CARDIZEM CD) 120 MG 24 hr capsule Take 120 mg by mouth daily.  Yes [provider]  ferrous sulfate 325 (65 FE) MG tablet Take 325 mg by mouth daily with breakfast.   Yes [provider]  furosemide (LASIX) 40 MG tablet Take 40 mg by mouth daily as needed (edema).    Yes [provider]  hydrochlorothiazide (MICROZIDE) 12.5 MG capsule  Take 12.5 mg by mouth daily.   Yes [provider]  losartan (COZAAR) 100 MG tablet Take 100 mg by mouth daily.    Yes [provider]  Magnesium 250 MG TABS Take 250 mg by mouth daily.   Yes [provider]  metoprolol succinate (TOPROL-XL) 50 MG 24 hr tablet Take 50 mg by mouth 2 (two) times daily. Take with or immediately following a meal.   Yes [provider]  OVER THE COUNTER MEDICATION Take 15 mLs by mouth daily.   Yes [provider]  vitamin B-12 (CYANOCOBALAMIN) 500 MCG tablet Take 500 mcg by mouth daily.   Yes [provider]  warfarin (COUMADIN) 1 MG tablet Take 1.5 mg by mouth daily.   Yes [provider]  warfarin (COUMADIN) 2 MG tablet Take 2 mg by mouth every Tuesday.     [provider]    Physical Exam: Vitals:   03/13/17 1644 03/13/17 1707 03/13/17 1933  BP: 110/74 124/76 124/80  Pulse: 76 82 83  Resp: 14 19 (!) 23  Temp: 98.1 F (36.7 C) 98.4 F (36.9 C) 98 F (36.7 C)  TempSrc: Oral Oral Oral  SpO2: (!) 84% 100% 100%  Weight: 63.5 kg (140 lb)    Height: 5\' 3"  (1.6 m)        Constitutional: NAD, calm, somnolent Eyes: PERTLA, lids and conjunctivae normal ENMT: Mucous membranes are moist. Posterior pharynx clear of any exudate or lesions.   Neck: normal, supple, no masses, no thyromegaly Respiratory: Breath sounds markedly diminished on right. Normal respiratory effort.   Cardiovascular: Rate ~80 and irregular. Pretibial edema to knees bilaterally. JVP 9 cm H2O. Abdomen: No distension, no tenderness, no masses palpated. Bowel sounds normal.  Musculoskeletal: no clubbing / cyanosis. No joint deformity upper and lower extremities.   Skin: no significant rashes, lesions, ulcers. Warm, dry, well-perfused. Neurologic: Slight left lower facial droop. PERRL. Sensation intact, patellar DTR's normal.  Psychiatric: Difficult to assess given the clinical scenario.     Labs on Admission: I have  personally reviewed following labs and imaging studies  CBC:  Recent Labs Lab 03/13/17 1835  WBC 7.3  NEUTROABS 6.0  HGB 10.9*  HCT 37.1  MCV 95.6  PLT 280   Basic Metabolic Panel:  Recent Labs Lab 03/13/17 1835  NA 151*  K 3.6  CL 110  CO2 34*  GLUCOSE 115*  BUN 47*  CREATININE 1.59*  CALCIUM 8.2*   GFR: Estimated Creatinine Clearance: 19.8 mL/min (A) (by C-G formula based on SCr of 1.59 mg/dL (H)). Liver Function Tests:  Recent Labs Lab 03/13/17 1835  AST 23  ALT 11*  ALKPHOS 51  BILITOT 0.9  PROT 7.1  ALBUMIN 3.4*   No results for input(s): LIPASE, AMYLASE in the last 168 hours. No results for input(s): AMMONIA in the last 168 hours. Coagulation Profile: No results for input(s): INR, PROTIME in the last 168 hours. Cardiac Enzymes: No results for input(s): CKTOTAL, CKMB, CKMBINDEX, TROPONINI in the last 168 hours. BNP (last 3 results) No results for input(s): PROBNP in the last 8760 hours. HbA1C: No results for input(s): HGBA1C in the last 72 hours. CBG: No  results for input(s): GLUCAP in the last 168 hours. Lipid Profile: No results for input(s): CHOL, HDL, LDLCALC, TRIG, CHOLHDL, LDLDIRECT in the last 72 hours. Thyroid Function Tests: No results for input(s): TSH, T4TOTAL, FREET4, T3FREE, THYROIDAB in the last 72 hours. Anemia Panel: No results for input(s): VITAMINB12, FOLATE, FERRITIN, TIBC, IRON, RETICCTPCT in the last 72 hours. Urine analysis: No results found for: COLORURINE, APPEARANCEUR, LABSPEC, PHURINE, GLUCOSEU, HGBUR, BILIRUBINUR, KETONESUR, PROTEINUR, UROBILINOGEN, NITRITE, LEUKOCYTESUR Sepsis Labs: @LABRCNTIP (procalcitonin:4,lacticidven:4) )No results found for this or any previous visit (from the past 240 hour(s)).   Radiological Exams on Admission: Ct Abdomen Pelvis Wo Contrast  Result Date: 03/13/2017 CLINICAL DATA:  Abdominal distention and pain. EXAM: CT ABDOMEN AND PELVIS WITHOUT CONTRAST TECHNIQUE: Multidetector CT imaging of  the abdomen and pelvis was performed following the standard protocol without IV contrast. COMPARISON:  None. FINDINGS: Lower chest: Large right pleural effusion is noted with adjacent subsegmental atelectasis. Hepatobiliary: No gallstones are noted. No focal abnormality is noted in the liver. Pancreas: Unremarkable. No pancreatic ductal dilatation or surrounding inflammatory changes. Spleen: Normal in size without focal abnormality. Adrenals/Urinary Tract: Adrenal glands are unremarkable. Large left renal cysts are noted. No hydronephrosis or renal obstruction is noted. Urinary bladder is unremarkable. Stomach/Bowel: Stomach is unremarkable. Colostomy is noted in left lower quadrant. Diverticulosis is noted throughout the colon without inflammation. Vascular/Lymphatic: Aortic atherosclerosis. No enlarged abdominal or pelvic lymph nodes. Reproductive: Uterus and bilateral adnexa are unremarkable. Other: Mild amount of free fluid is noted in the pelvis. Fluid is also noted around the liver and spleen. Musculoskeletal: No acute or significant osseous findings. IMPRESSION: Large right pleural effusion with adjacent subsegmental atelectasis. Large left renal cysts. Colostomy seen in left lower quadrant. Aortic atherosclerosis. Mild ascites is noted. Electronically Signed   By: Lupita Raider, M.D.   On: 03/13/2017 20:25   Dg Chest 2 View  Result Date: 03/13/2017 CLINICAL DATA:  Hypoxia, possible UTI, history hypertension, coronary artery disease, atrial fibrillation EXAM: CHEST  2 VIEW COMPARISON:  None FINDINGS: Very low lung volumes. Borderline enlargement of cardiac silhouette. Mediastinal contours normal. RIGHT pleural effusion and basilar atelectasis versus infiltrate. Minimal atelectasis at LEFT base. Mild central peribronchial thickening. No pneumothorax. Bones demineralized. IMPRESSION: Bronchitic changes with low lung volumes and mild LEFT basilar atelectasis. RIGHT pleural effusion with atelectasis versus  consolidation at lower RIGHT lung. Electronically Signed   By: Ulyses Southward M.D.   On: 03/13/2017 18:15   Ct Head Wo Contrast  Result Date: 03/13/2017 CLINICAL DATA:  Altered level of consciousness. Urinary symptoms for 1 week. History of hypertension and atrial fibrillation. EXAM: CT HEAD WITHOUT CONTRAST TECHNIQUE: Contiguous axial images were obtained from the base of the skull through the vertex without intravenous contrast. COMPARISON:  None. FINDINGS: BRAIN: No intraparenchymal hemorrhage, mass effect nor midline shift. The ventricles and sulci are normal for age. Patchy supratentorial white matter hypodensities within normal range for patient's age, though non-specific are most compatible with chronic small vessel ischemic disease. Old bilateral basal ganglia and thalamus lacunar infarcts. Symmetric basal ganglia mineralization. No acute large vascular territory infarcts. No abnormal extra-axial fluid collections. Basal cisterns are patent. VASCULAR: Moderate calcific atherosclerosis of the carotid siphons. 7 mm LEFT carotid terminus aneurysm. SKULL: No skull fracture. No significant scalp soft tissue swelling. SINUSES/ORBITS: The mastoid air-cells and included paranasal sinuses are well-aerated.The included ocular globes and orbital contents are non-suspicious. OTHER: None. IMPRESSION: 1. No acute intracranial process. 2. Moderate chronic small vessel ischemic disease and old lacunar infarcts.  3. 7 mm LEFT carotid terminus aneurysm would be better assessed on CT or MR angiogram on nonemergent basis. Electronically Signed   By: Awilda Metro M.D.   On: 03/13/2017 20:22    EKG: Independently reviewed. Atrial fibrillation.   Assessment/Plan  1. UTI  - Pt was sent by PCP for persistent urinary sxs and UA today with persistent bacteriuria and nitrites despite treatment with ciprofloxacin at home  - No culture data is available  - Repeat UA now and send for culture  - Start empiric Cefepime  pending cultures    2. Right pleural effusion, acute hypoxic/hypercarbic respiratory failure  - Pt is noted to be hypoxic in ED and started on supplemental O2  - Imaging reveals large right pleural effusion of uncertain etiology  - There is also concern for aspiration raised by caregiver who reports choking and foods and liquids despite thickening  - Continue supplemental O2, continue cefepime as above  - IR thoracentesis requested with fluid studies    3. Acute encephalopathy  - Pt presents with increased confusion and lethargy  - There is left facial droop, but chronic per caregiver, and no acute intracranial abnormality on head CT  - Likely secondary to UTI and/or hypercarbia  - Treating UTI and pleural effusion as above    4. Acute kidney injury superimposed on CKD stage III  - SCr is 1.59, up from 1.29 last month  - Pt appears hypervolemic on admission with peripheral edema and elevated JVP  - Plan to hold losartan, repeat chem panel in am    5. Dysphagia  - Pt's caregiver reports choking on liquids and solids at home - Head CT negative for acute intracranial abnormality  - Keep NPO pending SLP eval    6. Atrial fibrillation  - In rate-controlled atrial fibrillation on admission  - CHADS-VASc is 14 (age x2, gender, CHF, HTN, CVA x2)  - Continue diltiazem, metoprolol, and warfarin with pharmacy assistance    7. Chronic CHF - No echo on file to further characterize  - She appears volume-up on admission with peripheral edema, elevated JVP - She will be NPO d/t dysphagia and planned thoracentesis  - SLIV, follow daily wts and I/O's  - Hold Lasix initially as she will be NPO, repeat chem panel and fluid-status assessment in am   8. Hypertension  - BP at goal  - Resume metoprolol, diltiazem, and HCTZ as appropriate after SLP eval  - Hold losartan given acute increase in SCr  - Treat with hydralazine IVP's prn    DVT prophylaxis: warfarin  Code Status: Full  Family  Communication: Son updated by phone   Disposition Plan: Admit to SDU Consults called: None Admission status: Inpatient    Briscoe Deutscher, MD [facility] Pager [phone]  If 7PM-7AM, please contact night-coverage [email or web address] Password [email or web address]  03/13/2017, 9:19 PM

## 2017-03-13 NOTE — ED Notes (Signed)
Awaiting for Amy, RT to transport patient to assigned room 1234. Gave report to ConesteeHuey, RN earlier.

## 2017-03-13 NOTE — ED Notes (Signed)
RT at bedside.

## 2017-03-14 ENCOUNTER — Inpatient Hospital Stay (HOSPITAL_COMMUNITY): Payer: Medicare Other

## 2017-03-14 ENCOUNTER — Ambulatory Visit: Payer: Medicare Other | Admitting: Podiatry

## 2017-03-14 DIAGNOSIS — E870 Hyperosmolality and hypernatremia: Secondary | ICD-10-CM

## 2017-03-14 DIAGNOSIS — D689 Coagulation defect, unspecified: Secondary | ICD-10-CM | POA: Diagnosis present

## 2017-03-14 DIAGNOSIS — I361 Nonrheumatic tricuspid (valve) insufficiency: Secondary | ICD-10-CM

## 2017-03-14 DIAGNOSIS — I509 Heart failure, unspecified: Secondary | ICD-10-CM

## 2017-03-14 DIAGNOSIS — R1319 Other dysphagia: Secondary | ICD-10-CM

## 2017-03-14 LAB — AMMONIA: AMMONIA: 32 umol/L (ref 9–35)

## 2017-03-14 LAB — CBC WITH DIFFERENTIAL/PLATELET
BASOS PCT: 0 %
Basophils Absolute: 0 10*3/uL (ref 0.0–0.1)
EOS ABS: 0.1 10*3/uL (ref 0.0–0.7)
EOS PCT: 2 %
HCT: 38.5 % (ref 36.0–46.0)
HEMOGLOBIN: 11.4 g/dL — AB (ref 12.0–15.0)
Lymphocytes Relative: 11 %
Lymphs Abs: 0.8 10*3/uL (ref 0.7–4.0)
MCH: 28.6 pg (ref 26.0–34.0)
MCHC: 29.6 g/dL — AB (ref 30.0–36.0)
MCV: 96.7 fL (ref 78.0–100.0)
Monocytes Absolute: 0.9 10*3/uL (ref 0.1–1.0)
Monocytes Relative: 12 %
NEUTROS PCT: 75 %
Neutro Abs: 5.4 10*3/uL (ref 1.7–7.7)
PLATELETS: 246 10*3/uL (ref 150–400)
RBC: 3.98 MIL/uL (ref 3.87–5.11)
RDW: 19.5 % — ABNORMAL HIGH (ref 11.5–15.5)
WBC: 7.2 10*3/uL (ref 4.0–10.5)

## 2017-03-14 LAB — BODY FLUID CELL COUNT WITH DIFFERENTIAL
LYMPHS FL: 10 %
MONOCYTE-MACROPHAGE-SEROUS FLUID: 61 % (ref 50–90)
NEUTROPHIL FLUID: 29 % — AB (ref 0–25)
Total Nucleated Cell Count, Fluid: 435 cu mm (ref 0–1000)

## 2017-03-14 LAB — BASIC METABOLIC PANEL
Anion gap: 6 (ref 5–15)
BUN: 43 mg/dL — ABNORMAL HIGH (ref 6–20)
CALCIUM: 7.8 mg/dL — AB (ref 8.9–10.3)
CO2: 32 mmol/L (ref 22–32)
CREATININE: 1.49 mg/dL — AB (ref 0.44–1.00)
Chloride: 112 mmol/L — ABNORMAL HIGH (ref 101–111)
GFR calc Af Amer: 34 mL/min — ABNORMAL LOW (ref >60)
GFR calc non Af Amer: 29 mL/min — ABNORMAL LOW (ref >60)
Glucose, Bld: 94 mg/dL (ref 65–99)
Potassium: 3.9 mmol/L (ref 3.5–5.1)
SODIUM: 150 mmol/L — AB (ref 135–145)

## 2017-03-14 LAB — GLUCOSE, PLEURAL OR PERITONEAL FLUID: Glucose, Fluid: 89 mg/dL

## 2017-03-14 LAB — GRAM STAIN

## 2017-03-14 LAB — MRSA PCR SCREENING: MRSA by PCR: NEGATIVE

## 2017-03-14 LAB — MAGNESIUM: MAGNESIUM: 2.9 mg/dL — AB (ref 1.7–2.4)

## 2017-03-14 LAB — PROTEIN, PLEURAL OR PERITONEAL FLUID

## 2017-03-14 LAB — ECHOCARDIOGRAM COMPLETE
Height: 63 in
WEIGHTICAEL: 2550.28 [oz_av]

## 2017-03-14 LAB — LACTATE DEHYDROGENASE: LDH: 179 U/L (ref 98–192)

## 2017-03-14 LAB — PROTIME-INR
INR: 5.77 — AB
PROTHROMBIN TIME: 53.6 s — AB (ref 11.4–15.2)

## 2017-03-14 LAB — LACTATE DEHYDROGENASE, PLEURAL OR PERITONEAL FLUID: LD FL: 96 U/L — AB (ref 3–23)

## 2017-03-14 LAB — VITAMIN B12

## 2017-03-14 LAB — TSH: TSH: 2.077 u[IU]/mL (ref 0.350–4.500)

## 2017-03-14 MED ORDER — RESOURCE THICKENUP CLEAR PO POWD
ORAL | Status: DC | PRN
  Filled 2017-03-14: qty 125

## 2017-03-14 MED ORDER — LIDOCAINE HCL 1 % IJ SOLN
INTRAMUSCULAR | Status: AC
  Filled 2017-03-14: qty 20

## 2017-03-14 MED ORDER — FUROSEMIDE 10 MG/ML IJ SOLN
40.0000 mg | Freq: Every day | INTRAMUSCULAR | Status: DC
  Administered 2017-03-14 – 2017-03-17 (×4): 40 mg via INTRAVENOUS
  Filled 2017-03-14 (×4): qty 4

## 2017-03-14 MED ORDER — METOPROLOL TARTRATE 5 MG/5ML IV SOLN
2.5000 mg | Freq: Four times a day (QID) | INTRAVENOUS | Status: DC
  Administered 2017-03-14 – 2017-03-15 (×6): 2.5 mg via INTRAVENOUS
  Filled 2017-03-14 (×6): qty 5

## 2017-03-14 MED ORDER — WARFARIN - PHARMACIST DOSING INPATIENT: Freq: Every day | Status: DC

## 2017-03-14 NOTE — Evaluation (Signed)
SLP Cancellation Note  Patient Details Name: Emily NettleMavis Elliott MRN: [id number] DOB: 04/18/1921   Cancelled treatment:       Reason Eval/Treat Not Completed: Fatigue/lethargy limiting ability to participate (spoke to RN who reports pt has not been alert enough for po/evaluation, will continue efforts)  Emily Burnetamara Elonda Giuliano, MS [facility] SLP [phone]  Emily AbrahamsKimball, Emily Elliott [name] 03/14/2017, 12:39 PM

## 2017-03-14 NOTE — Evaluation (Signed)
Clinical/Bedside Swallow Evaluation Patient Details  Name: Emily Elliott MRN: [id number] Date of Birth: 10/10/1920  Today's Date: 03/14/2017 Time: SLP Start Time (ACUTE ONLY): 1410 SLP Stop Time (ACUTE ONLY): 1450 SLP Time Calculation (min) (ACUTE ONLY): 40 min  Past Medical History:  Past Medical History:  Diagnosis Date  . Atrial fibrillation (HCC)   . Coronary artery disease   . Hypertension    Past Surgical History:  Past Surgical History:  Procedure Laterality Date  . COLOSTOMY     HPI:  83 yo female adm to [facility] with weakness, confusion, somnolence - pt diagnosed with Urinary Tract Infection.  PMH + for old lacunar CVAs - bilateral basal ganglia, thalamic CVA.  Per daughter Colvin Caroli  - pt has been coughing with intake at home and reports issues with "feeling full" pointing to proximal esophagus.  Pt denies this being a significant issue.  She has resided with daughter for the last 18 months.  Pt with ? ascities per imaging study.  CXR showed large right pleural effusion - diagnosed with respiratory acidosis - for thoracentesis today.     Assessment / Plan / Recommendation Clinical Impression  Concern for multifactorial dysphagia present- Pt with frothy secretions retained in oral cavity requiring clearance with tissues.  SlP questions esophageal and/or pulmonary source.  Difficult to decipher pt's symptoms given her current mentation.  Pt does present with overt indication of aspiration with tsp of water characterized by immediate cough.   Good tolerance of nectar and puree noted - no indications of aspiration/penetration.  Proceed with MBS next day to allow instrumental evaluation to assure least restrictive diet.  Recommend small amounts of liquid nectar thick today as tolerated and MBS in am.  Pt and daughter agreeable to plan and RN informed.  SLP Visit Diagnosis: Dysphagia, oropharyngeal phase (R13.12);Dysphagia, pharyngoesophageal phase (R13.14)    Aspiration Risk  Mild  aspiration risk    Diet Recommendation Nectar-thick liquid   Liquid Administration via: Cup;Spoon Supervision: Full supervision/cueing for compensatory strategies Compensations: Slow rate;Small sips/bites Postural Changes: Seated upright at 90 degrees;Remain upright for at least 30 minutes after po intake    Other  Recommendations Oral Care Recommendations: Oral care BID Other Recommendations: Order thickener from pharmacy   Follow up Recommendations  (tbd)      Frequency and Duration min 2x/week  1 week       Prognosis Prognosis for Safe Diet Advancement: Fair      Swallow Study   General Date of Onset: 03/14/17 HPI: 83 yo female adm to [facility] with weakness, confusion, somnolence - pt diagnosed with Urinary Tract Infection.  PMH + for old lacunar CVAs - bilateral basal ganglia, thalamic CVA.  Per daughter Colvin Caroli  - pt has been coughing with intake at home and reports issues with "feeling full" pointing to proximal esophagus.  Pt denies this being a significant issue.  She has resided with daughter for the last 18 months.  Pt with ? ascities per imaging study.  CXR showed large right pleural effusion - diagnosed with respiratory acidosis - for thoracentesis today.   Type of Study: Bedside Swallow Evaluation Diet Prior to this Study: NPO Temperature Spikes Noted: No Respiratory Status: Nasal cannula (2 liters) History of Recent Intubation: No Behavior/Cognition: Alert;Cooperative;Pleasant mood Oral Cavity Assessment: Within Functional Limits Oral Care Completed by SLP: Yes Oral Cavity - Dentition: Edentulous (pt does not use dentures due to ill fitting - reports dentures did not fit when first received) Self-Feeding Abilities: Needs assist;Total assist (total assistance  recently at home per daughter) Patient Positioning: Upright in bed Baseline Vocal Quality: Low vocal intensity Volitional Cough: Weak Volitional Swallow: Able to elicit    Oral/Motor/Sensory Function Overall  Oral Motor/Sensory Function: Mild impairment Facial ROM: Suspected CN VII (facial) dysfunction;Reduced left Facial Symmetry: Abnormal symmetry left;Suspected CN VII (facial) dysfunction Facial Strength: Within Functional Limits Facial Sensation: Within Functional Limits Lingual ROM: Within Functional Limits Lingual Symmetry: Within Functional Limits Lingual Strength: Within Functional Limits Lingual Sensation: Within Functional Limits Velum: Within Functional Limits Mandible: Within Functional Limits   Ice Chips Ice chips: Not tested Other Comments: pt does not drink cold items   Thin Liquid Thin Liquid: Impaired Presentation: Spoon Pharyngeal  Phase Impairments: Cough - Immediate    Nectar Thick Nectar Thick Liquid: Within functional limits Presentation: Cup;Spoon;Self Fed Other Comments: with assist for balancing able to hold her own cup   Honey Thick Honey Thick Liquid: Not tested   Puree Puree: Within functional limits Presentation: Spoon   Solid   GO   Solid: Not tested       Donavan Burnetamara Necha Harries, MS [facility] SLP [phone]  [name], Madie Cahn [name] 03/14/2017,3:20 PM

## 2017-03-14 NOTE — Progress Notes (Signed)
Pts. BiPAP V-60 changed out with Servo-I placed on w/o difficulty settings adjusted per pts, original/prior settings, RT to monitor.

## 2017-03-14 NOTE — Care Management Note (Signed)
Case Management Note  Patient Details  Name: Emily Elliott MRN: [id number] Date of Birth: 1920/12/25  Subjective/Objective:                  Uti, chf,hypernatremia  Action/Plan: Date:  March 14, 2017 Chart reviewed for concurrent status and case management needs. Will continue to follow patient progress. Discharge Planning: following for needs Expected discharge date: [id number] Marcelle SmilingRhonda Elliott, BSN, [place]3, [place]   [phone]  Expected Discharge Date:   (unknown)               Expected Discharge Plan:  Home/Self Care  In-House Referral:     Discharge planning Services  CM Consult  Post Acute Care Choice:    Choice offered to:     DME Arranged:    DME Agency:     HH Arranged:    HH Agency:     Status of Service:  In process, will continue to follow  If discussed at Long Length of Stay Meetings, dates discussed:    Additional Comments:  Emily Elliott, Emily Lynn, RN 03/14/2017, 8:39 AM

## 2017-03-14 NOTE — Progress Notes (Signed)
Removed PT from BiPAP and placed on 3 lpm [place]. Sp02 remained at 100% therefore, reduced to 2 lpm [place]. PT does not appear to be in respiratory distress at this time. HR 84, 2 lpm [place] with Sp02 of 98%, RR 14, BP 134/75, BBS clear-diminished. RN aware.

## 2017-03-14 NOTE — Progress Notes (Signed)
ANTICOAGULATION CONSULT NOTE   Pharmacy Consult for warfarin Indication: hx atrial fibrillation  Allergies  Allergen Reactions  . Benadryl [Diphenhydramine]     rash    Patient Measurements: Height: 5\' 3"  (160 cm) Weight: 159 lb 6.3 oz (72.3 kg) IBW/kg (Calculated) : 52.4   Vital Signs: Temp: 97.7 F (36.5 C) (08/07 0720) Temp Source: Axillary (08/07 0720) BP: 131/76 (08/07 0900) Pulse Rate: 79 (08/07 0900)  Labs:  Recent Labs  03/13/17 1835 03/13/17 2058 03/14/17 0325  HGB 10.9*  --  11.4*  HCT 37.1  --  38.5  PLT 280  --  246  LABPROT  --  56.8* 53.6*  INR  --  6.20* 5.77*  CREATININE 1.59*  --  1.49*    Estimated Creatinine Clearance: 22.5 mL/min (A) (by C-G formula based on SCr of 1.49 mg/dL (H)).   Medical History: Past Medical History:  Diagnosis Date  . Atrial fibrillation (HCC)   . Coronary artery disease   . Hypertension     Medications:  Home warfarin regimen:1.5 mg daily except 2 mg on Tuesdays  Assessment: Patient's a 83 y.o F with hx afib on warfarin PTA, presented to the ED on 03/13/17 with c/o dysuria.  To resume warfarin while patient is hospitalized.  Of note, patient was on cipro PTA and was instructed by her PCP to hold warfarin (d/t elevated INR secondary to drug-drug intxns) until she has completed her course of cipro (last warfarin dose taken on 8/2, scheduled to resume warfarin back on 03/14/17).   Today, 03/14/2017: - INR is elevated at 5.77 - hgb 1.4, plt ok - no bleeding reported  Goal of Therapy:  INR 2-3 Monitor platelets by anticoagulation protocol: Yes   Plan:  - hold warfarin for now.  Will f/u with INR and resume when appropriate. - daily INR - monitor for s/s of bleeding  Herby AbrahamMichelle T. Kendallyn Lippold, Pharm.D. [phone] 03/14/2017 10:20 AM

## 2017-03-14 NOTE — Procedures (Signed)
Ultrasound-guided diagnostic and therapeutic right thoracentesis performed yielding 0.6 liters of serous colored fluid. No immediate complications. Follow-up chest x-ray pending.       Halden Phegley E 4:18 PM  03/14/2017

## 2017-03-14 NOTE — Progress Notes (Signed)
CRITICAL VALUE ALERT  Critical Value: INR 5.77 Date & Time Notied:  03/14/17 0407  Provider Notified: Toniann FailKakrakandy  Orders Received/Actions taken: none

## 2017-03-14 NOTE — Progress Notes (Signed)
Initial Nutrition Assessment  DOCUMENTATION CODES:   Not applicable  INTERVENTION:   RD will order supplements when diet advanced  NUTRITION DIAGNOSIS:   Inadequate oral intake related to inability to eat as evidenced by NPO status.  GOAL:   Patient will meet greater than or equal to 90% of their needs  MONITOR:   Diet advancement, Labs, Weight trends, I & O's  REASON FOR ASSESSMENT:   Malnutrition Screening Tool    ASSESSMENT:   83 year old female with a history of essential hypertension, chronic atrial fibrillation, colon cancer status post hemicolectomy, CKD stage III presenting with one-week history of increasing generalized weakness and lethargy.    Unable to speak with pt today as pt was sleeping at time of RD visit and on Bipap. Pt is currently NPO. Per MD note, pt previously evaluated by speech therapy 09/26/16 and recommended for DYS 3/thin liquid diet. Per chart, pt with wt gain likely secondary to edema and pleural effusion. RD will order appropriate supplements when diet advanced.    Medications reviewed and include: cyanocobalamin, lasix, cefepime  Labs reviewed: Na 150(H), Cl 112(H), BUN 43(H), creat 1.49(H), Ca 7.8(L)  Nutrition-Focused physical exam completed. Findings are moderate fat depletions in arms, mild to moderate muscle depletions in clavicles, and moderate edema in BLE.   Diet Order:  Diet NPO time specified  Skin:  Reviewed, no issues  Last BM:  ostomy   Height:   Ht Readings from Last 1 Encounters:  03/14/17 5\' 3"  (1.6 m)    Weight:   Wt Readings from Last 1 Encounters:  03/14/17 159 lb 6.3 oz (72.3 kg)    Ideal Body Weight:  52.3 kg  BMI:  Body mass index is 28.24 kg/m.  Estimated Nutritional Needs:   Kcal:  1550-1850kcal/day   Protein:  75-88g/day   Fluid:  >1.5L/day   EDUCATION NEEDS:   Education needs no appropriate at this time  Betsey Holidayasey Malek Skog MS, RD, LDN Pager #[phone]- [phone] After Hours Pager: [phone]

## 2017-03-14 NOTE — Progress Notes (Signed)
PROGRESS NOTE  Manuella Blackson ZOX:[id number] DOB: Jul 01, 1921 DOA: 03/13/2017 PCP: Patient, No Pcp Per  Brief History:  83 year old female with a history of essential hypertension, chronic atrial fibrillation, colon cancer status post hemicolectomy, CKD stage III presenting with one-week history of increasing generalized weakness and lethargy. At baseline, the patient has cognitive impairment and requires assistance with her ADLs including bathing and dressing. However, the patient is able to feed herself and ambulate with a walker. She does require assistance getting out of bed and with transfers at baseline. In the past week, the family has noted increasing generalized weakness with increasing difficulty getting out of bed and bearing any weight. In addition, the patient has had decreased oral intake. The patient's family is also noted increasing "swelling" in her abdomen. The patient was placed on Cipro for presumptive UTI possibly one week prior to this admission. Because of persistent lethargy, she was seen by her primary care provider on 03/13/2017. Reportedly, urinalysis showed bacteriuria and nitrites. Because of concern for persistent UTI and continued lethargy, the patient strictly to the emergency department. In the emergency department, the patient was noted to be hypoxic with oxygen saturation 84% on room air. VBG showed hypercarbia and hypoxia.  She was subsequently placed on BiPAP and stabilized. Chest x-ray showed a right pleural effusion with RLL atelectasis/infiltrate. CT of the abdomen and pelvis showed a large right pleural effusion with adjacent atelectasis. There was a LLQ ostomy, but no other acute findings on CT of the abdomen. Ceftriaxone and azithromycin were given in the emergency department.  Assessment/Plan: Acute metabolic encephalopathy -Multifactorial including UTI, decompensated heart failure, hypernatremia -Check TSH -Serum B12 -Check ammonia  Acute  respiratory failure with hypoxia and hypercarbia -Secondary to decompensated heart failure/pleural effusion -Start intravenous furosemide -Echocardiogram -wean off BiPAP -thoracocentesis for pleural effusion -remain npo while on BiPAP  Right pleural effusion -Likely due to CHF, but cannot rule out malignant effusion given previous history of colon cancer -Requested by orthopedic thoracocentesis -Send fluid for cell count, culture, LDH, protein, cytology  Acute CHF -The patient is clinically volume overloaded with significant peripheral edema, anasarca, and JVD -Start intravenous furosemide -Daily weights -Daily BMP -Echo  UTI -UA shows TNTC WBC -Continue cefepime pending culture data  CKD 3 -Baseline creatinine 1.1-1.3 -Presenting creatinine 1.59 -Monitor with diuresis  Chronic atrial fibrillation -CHADSVASc = 7 -continue warfarin--currently INR supratherapeutic -rate controlled -continue metoprolol succinate and cardizem as BP allows  Dysphagia -Previously evaluated by speech therapy 09/26/16-->dysphagia 3 with thin -repeat eval when more alert  History of colon cancer status post colostomy -Had colon resection in 2014 -Consult wound care nurse for ostomy care -Per family history, patient did not require radiation or chemotherapy  Essential hypertension -BP initially soft upon presentation -Continue diltiazem and metoprolol succinate as BP allows -Monitor closely with furosemide diuresis  Coagulopathy  -INR is supratherapeutic -Likely due to recent ciprofloxacin use and poor po  Hypernatremia -monitor closely with diuressis  Elevated troponin -likely demand ischemia in setting of UTI and decompensated CHF      Disposition Plan:   Remain in stepdown Family Communication:   Updated son on phone, daughter at bedside--Total time spent 45 minutes.  Greater than 50% spent face to face counseling and coordinating care.  Consultants:  none  Code Status:   FULL--verified with son who is HPOA  DVT Prophylaxis:  Coumadin   Procedures: As Listed in Progress Note Above  Antibiotics: Ceftriaxone/azithromycin x 1 on  8/6 Cefepime 8/7>>>    Subjective: The patient is awake and alert on BiPAP. She is only able to answer simple questions intermittently. She denies any chest pain, abdominal pain, shortness of breath. Remainder of review of systems unobtainable secondary to encephalopathy. No reports of respiratory distress, uncontrolled pain, vomiting, diarrhea.   Objective: Vitals:   03/14/17 0300 03/14/17 0312 03/14/17 0700 03/14/17 0720  BP: (!) 89/50  138/82   Pulse: 74     Resp: 19  18   Temp:  97.8 F (36.6 C)  97.7 F (36.5 C)  TempSrc:  Axillary  Axillary  SpO2: 100%     Weight:   72.3 kg (159 lb 6.3 oz)   Height:       No intake or output data in the 24 hours ending 03/14/17 0748 Weight change:  Exam:   General:  Pt is alert, follows does not follow commands appropriately, not in acute distress  HEENT: No icterus,  No neck mass, [place]/AT  Cardiovascular: IRRR, S1/S2, no rubs, no gallops  Respiratory: Bibasilar crackles. Diminished breath sounds on the right. No wheezing.   Abdomen: Soft/+BS, non tender, non distended, no guarding; anasarca; LLQ ostomy  Extremities: 2 + LE edema, No lymphangitis, No petechiae, No rashes, no synovitis   Data Reviewed: I have personally reviewed following labs and imaging studies Basic Metabolic Panel:  Recent Labs Lab 03/13/17 1835 03/14/17 0325  NA 151* 150*  K 3.6 3.9  CL 110 112*  CO2 34* 32  GLUCOSE 115* 94  BUN 47* 43*  CREATININE 1.59* 1.49*  CALCIUM 8.2* 7.8*   Liver Function Tests:  Recent Labs Lab 03/13/17 1835  AST 23  ALT 11*  ALKPHOS 51  BILITOT 0.9  PROT 7.1  ALBUMIN 3.4*   No results for input(s): LIPASE, AMYLASE in the last 168 hours. No results for input(s): AMMONIA in the last 168 hours. Coagulation Profile:  Recent Labs Lab 03/13/17 2058  03/14/17 0325  INR 6.20* 5.77*   CBC:  Recent Labs Lab 03/13/17 1835 03/14/17 0325  WBC 7.3 7.2  NEUTROABS 6.0 5.4  HGB 10.9* 11.4*  HCT 37.1 38.5  MCV 95.6 96.7  PLT 280 246   Cardiac Enzymes: No results for input(s): CKTOTAL, CKMB, CKMBINDEX, TROPONINI in the last 168 hours. BNP: Invalid input(s): POCBNP CBG: No results for input(s): GLUCAP in the last 168 hours. HbA1C: No results for input(s): HGBA1C in the last 72 hours. Urine analysis:    Component Value Date/Time   COLORURINE YELLOW 03/13/2017 2241   APPEARANCEUR HAZY (A) 03/13/2017 2241   LABSPEC 1.016 03/13/2017 2241   PHURINE 6.0 03/13/2017 2241   GLUCOSEU NEGATIVE 03/13/2017 2241   HGBUR NEGATIVE 03/13/2017 2241   BILIRUBINUR NEGATIVE 03/13/2017 2241   KETONESUR NEGATIVE 03/13/2017 2241   PROTEINUR 100 (A) 03/13/2017 2241   NITRITE NEGATIVE 03/13/2017 2241   LEUKOCYTESUR LARGE (A) 03/13/2017 2241   Sepsis Labs: @LABRCNTIP (procalcitonin:4,lacticidven:4) ) Recent Results (from the past 240 hour(s))  MRSA PCR Screening     Status: None   Collection Time: 03/13/17 11:44 PM  Result Value Ref Range Status   MRSA by PCR NEGATIVE NEGATIVE Final    Comment:        The GeneXpert MRSA Assay (FDA approved for NASAL specimens only), is one component of a comprehensive MRSA colonization surveillance program. It is not intended to diagnose MRSA infection nor to guide or monitor treatment for MRSA infections.      Scheduled Meds: . atorvastatin  10 mg Oral  49q1800  . cholecalciferol  5,000 Units Oral Daily  . cyanocobalamin  500 mcg Oral Daily  . diltiazem  120 mg Oral Daily  . ferrous sulfate  325 mg Oral Q breakfast  . magnesium oxide  200 mg Oral Daily  . metoprolol succinate  50 mg Oral BID  . sodium chloride flush  3 mL Intravenous Q12H   Continuous Infusions: . sodium chloride    . ceFEPime (MAXIPIME) IV      Procedures/Studies: Ct Abdomen Pelvis Wo Contrast  Result Date: 03/13/2017 CLINICAL  DATA:  Abdominal distention and pain. EXAM: CT ABDOMEN AND PELVIS WITHOUT CONTRAST TECHNIQUE: Multidetector CT imaging of the abdomen and pelvis was performed following the standard protocol without IV contrast. COMPARISON:  None. FINDINGS: Lower chest: Large right pleural effusion is noted with adjacent subsegmental atelectasis. Hepatobiliary: No gallstones are noted. No focal abnormality is noted in the liver. Pancreas: Unremarkable. No pancreatic ductal dilatation or surrounding inflammatory changes. Spleen: Normal in size without focal abnormality. Adrenals/Urinary Tract: Adrenal glands are unremarkable. Large left renal cysts are noted. No hydronephrosis or renal obstruction is noted. Urinary bladder is unremarkable. Stomach/Bowel: Stomach is unremarkable. Colostomy is noted in left lower quadrant. Diverticulosis is noted throughout the colon without inflammation. Vascular/Lymphatic: Aortic atherosclerosis. No enlarged abdominal or pelvic lymph nodes. Reproductive: Uterus and bilateral adnexa are unremarkable. Other: Mild amount of free fluid is noted in the pelvis. Fluid is also noted around the liver and spleen. Musculoskeletal: No acute or significant osseous findings. IMPRESSION: Large right pleural effusion with adjacent subsegmental atelectasis. Large left renal cysts. Colostomy seen in left lower quadrant. Aortic atherosclerosis. Mild ascites is noted. Electronically Signed   By: Lupita RaiderJames  Green Jr, M.D.   On: 03/13/2017 20:25   Dg Chest 2 View  Result Date: 03/13/2017 CLINICAL DATA:  Hypoxia, possible UTI, history hypertension, coronary artery disease, atrial fibrillation EXAM: CHEST  2 VIEW COMPARISON:  None FINDINGS: Very low lung volumes. Borderline enlargement of cardiac silhouette. Mediastinal contours normal. RIGHT pleural effusion and basilar atelectasis versus infiltrate. Minimal atelectasis at LEFT base. Mild central peribronchial thickening. No pneumothorax. Bones demineralized. IMPRESSION:  Bronchitic changes with low lung volumes and mild LEFT basilar atelectasis. RIGHT pleural effusion with atelectasis versus consolidation at lower RIGHT lung. Electronically Signed   By: Ulyses SouthwardMark  Boles M.D.   On: 03/13/2017 18:15   Ct Head Wo Contrast  Result Date: 03/13/2017 CLINICAL DATA:  Altered level of consciousness. Urinary symptoms for 1 week. History of hypertension and atrial fibrillation. EXAM: CT HEAD WITHOUT CONTRAST TECHNIQUE: Contiguous axial images were obtained from the base of the skull through the vertex without intravenous contrast. COMPARISON:  None. FINDINGS: BRAIN: No intraparenchymal hemorrhage, mass effect nor midline shift. The ventricles and sulci are normal for age. Patchy supratentorial white matter hypodensities within normal range for patient's age, though non-specific are most compatible with chronic small vessel ischemic disease. Old bilateral basal ganglia and thalamus lacunar infarcts. Symmetric basal ganglia mineralization. No acute large vascular territory infarcts. No abnormal extra-axial fluid collections. Basal cisterns are patent. VASCULAR: Moderate calcific atherosclerosis of the carotid siphons. 7 mm LEFT carotid terminus aneurysm. SKULL: No skull fracture. No significant scalp soft tissue swelling. SINUSES/ORBITS: The mastoid air-cells and included paranasal sinuses are well-aerated.The included ocular globes and orbital contents are non-suspicious. OTHER: None. IMPRESSION: 1. No acute intracranial process. 2. Moderate chronic small vessel ischemic disease and old lacunar infarcts. 3. 7 mm LEFT carotid terminus aneurysm would be better assessed on CT or MR angiogram on nonemergent  basis. Electronically Signed   By: Awilda Metro M.D.   On: 03/13/2017 20:22    Christian Treadway, DO  [facility] Pager [phone]  If 7PM-7AM, please contact night-coverage [email or web address] Password [email or web address] 03/14/2017, 7:48 AM   LOS: 1 day

## 2017-03-14 NOTE — Progress Notes (Signed)
Pt. Transported from ED to ICU/SD uneventfully, RT to monitor.

## 2017-03-14 NOTE — Consult Note (Signed)
WOC Nurse ostomy consult note Stoma type/location: LLQ colostomy (established). Stomal assessment/size: 1 and 3/8 inches, red, moist, slightly long Peristomal assessment: not seen today.  Skin barrier applied yesterday. Treatment options for stomal/peristomal skin: None indicated. Output: soft brown stool Ostomy pouching: 2pc. 2 and 1/4 inch pouching system by [employer], the same brand that we use here at the hospital.  Patient uses closed end pouch at home.  I have provided 2 skin barriers and two pouches to the bedside and attached a drainable pouch with integrated gas filter to the skin barrier today. I explain to patient that we will empty the pouch while she is with us in acute care and that she can return to the use of her closed-end pouches upon discharge.  She indicates she has no difficulties with supplies, and persistent or recurrent skin issues or with managing her ostomy at home.  Staff tell me she is assisted by her daughter. Education provided: See abvoe Enrolled patient in [employer] DC program: No WOC nursing team will not follow, but will remain available to this patient, the nursing and medical teams.  Please re-consult if needed. Thanks, Ladona MowLaurie Makinlee Awwad, MSN, RN, GNP, Hans EdenCWOCN, CWON-AP, FAAN  Pager# [phone]

## 2017-03-14 NOTE — Progress Notes (Signed)
  Echocardiogram 2D Echocardiogram has been performed.  Pieter PartridgeBrooke S Toriann Spadoni 03/14/2017, 10:52 AM

## 2017-03-15 ENCOUNTER — Encounter (HOSPITAL_COMMUNITY): Payer: Self-pay | Admitting: Cardiology

## 2017-03-15 ENCOUNTER — Inpatient Hospital Stay (HOSPITAL_COMMUNITY): Payer: Medicare Other

## 2017-03-15 DIAGNOSIS — I509 Heart failure, unspecified: Secondary | ICD-10-CM

## 2017-03-15 DIAGNOSIS — N179 Acute kidney failure, unspecified: Secondary | ICD-10-CM

## 2017-03-15 DIAGNOSIS — J9601 Acute respiratory failure with hypoxia: Secondary | ICD-10-CM

## 2017-03-15 DIAGNOSIS — I482 Chronic atrial fibrillation: Secondary | ICD-10-CM

## 2017-03-15 DIAGNOSIS — J9602 Acute respiratory failure with hypercapnia: Secondary | ICD-10-CM

## 2017-03-15 LAB — BASIC METABOLIC PANEL
ANION GAP: 7 (ref 5–15)
BUN: 40 mg/dL — ABNORMAL HIGH (ref 6–20)
CO2: 37 mmol/L — AB (ref 22–32)
Calcium: 8.1 mg/dL — ABNORMAL LOW (ref 8.9–10.3)
Chloride: 108 mmol/L (ref 101–111)
Creatinine, Ser: 1.47 mg/dL — ABNORMAL HIGH (ref 0.44–1.00)
GFR calc Af Amer: 34 mL/min — ABNORMAL LOW (ref >60)
GFR, EST NON AFRICAN AMERICAN: 30 mL/min — AB (ref >60)
GLUCOSE: 80 mg/dL (ref 65–99)
POTASSIUM: 3.6 mmol/L (ref 3.5–5.1)
Sodium: 152 mmol/L — ABNORMAL HIGH (ref 135–145)

## 2017-03-15 LAB — URINE CULTURE: Special Requests: NORMAL

## 2017-03-15 LAB — AMYLASE, PLEURAL OR PERITONEAL FLUID: Amylase, Fluid: 31 U/L

## 2017-03-15 LAB — PROTIME-INR
INR: 6.37 — AB
Prothrombin Time: 58 seconds — ABNORMAL HIGH (ref 11.4–15.2)

## 2017-03-15 LAB — MAGNESIUM: Magnesium: 2.7 mg/dL — ABNORMAL HIGH (ref 1.7–2.4)

## 2017-03-15 MED ORDER — DILTIAZEM HCL ER COATED BEADS 120 MG PO CP24
120.0000 mg | ORAL_CAPSULE | Freq: Every day | ORAL | Status: DC
  Administered 2017-03-15 – 2017-03-21 (×7): 120 mg via ORAL
  Filled 2017-03-15 (×7): qty 1

## 2017-03-15 MED ORDER — METOPROLOL SUCCINATE ER 50 MG PO TB24
50.0000 mg | ORAL_TABLET | Freq: Two times a day (BID) | ORAL | Status: DC
  Administered 2017-03-16 – 2017-03-21 (×11): 50 mg via ORAL
  Filled 2017-03-15: qty 2
  Filled 2017-03-15 (×10): qty 1

## 2017-03-15 NOTE — Progress Notes (Signed)
CRITICAL VALUE ALERT  Critical Value: INR 6.37  Date & Time Notied: 03/15/2017 0355  Provider Notified: Toniann FailKakrakandy  Orders Received/Actions taken: no new orders, continuing to hold coumadin

## 2017-03-15 NOTE — Consult Note (Addendum)
CARDIOLOGY CONSULT NOTE  Patient ID: Emily Elliott MRN: [id number] DOB/AGE: 1921-05-10 83 y.o.  Admit date: 03/13/2017 Referring Physician  Catarina Hartshorn, MD Primary Physician:  Patient, No Pcp Per Reason for Consultation  Chf  HPI: Emily Elliott  is a 83 y.o. female  With She has history of chronic atrial fibrillation, chronic diastolic heart failure, stage III chronic kidney disease and chronic venous stasis lower extremity edema. She has been on Coumadin for atrial fibrillation. She was admitted to the hospital on 03/13/2017 with generalized weakness, failure to thrive, poor oral intake, worsening leg edema and abdominal distention and was diagnosed with UTI and patient was also found to be in acute decompensated diastolic heart failure for which she was placed on BiPAP, she also underwent right pleural effusion pleurocentesis and had 6-700 mL of straw-colored fluid removed. We will consulted because of elevated BNP and for management of diastolic heart failure and chronic atrial fibrillation.  She is presently on antibiotics for the same, patient is much more alert, states that her dyspnea has improved, appetite is coming back. Denies any cough, denies fever or chills. Denies any chest pain.  Past Medical History:  Diagnosis Date  . Atrial fibrillation (HCC)   . Hypertension      Past Surgical History:  Procedure Laterality Date  . COLOSTOMY       History reviewed. No pertinent family history.   Social History: Social History   Social History  . Marital status: Widowed    Spouse name: N/A  . Number of children: N/A  . Years of education: N/A   Occupational History  . Not on file.   Social History Main Topics  . Smoking status: Never Smoker  . Smokeless tobacco: Never Used  . Alcohol use No  . Drug use: No  . Sexual activity: Not on file   Other Topics Concern  . Not on file   Social History Narrative  . No narrative on file     Prescriptions Prior to Admission   Medication Sig Dispense Refill Last Dose  . Ascorbic Acid (VITAMIN C) 1000 MG tablet Take 1,000 mg by mouth daily.   03/13/2017 at Unknown time  . atorvastatin (LIPITOR) 10 MG tablet Take 10 mg by mouth every evening.   03/12/2017 at Unknown time  . Cholecalciferol (D-3-5) 5000 units capsule Take 5,000 Units by mouth daily.   03/13/2017 at Unknown time  . ciprofloxacin (CIPRO) 250 MG tablet Take 250 mg by mouth 2 (two) times daily.   03/13/2017 at 2pm  . COD LIVER OIL/LOW VITAMIN A CAPS Take 1 capsule by mouth daily.   03/13/2017 at Unknown time  . diltiazem (CARDIZEM CD) 120 MG 24 hr capsule Take 120 mg by mouth daily.   03/13/2017 at Unknown time  . ferrous sulfate 325 (65 FE) MG tablet Take 325 mg by mouth daily with breakfast.   03/13/2017 at Unknown time  . furosemide (LASIX) 40 MG tablet Take 40 mg by mouth daily as needed (edema).    Past Month at Unknown time  . hydrochlorothiazide (MICROZIDE) 12.5 MG capsule Take 12.5 mg by mouth daily.   03/13/2017 at Unknown time  . losartan (COZAAR) 100 MG tablet Take 100 mg by mouth daily.    03/13/2017 at Unknown time  . Magnesium 250 MG TABS Take 250 mg by mouth daily.   03/13/2017 at Unknown time  . metoprolol succinate (TOPROL-XL) 50 MG 24 hr tablet Take 50 mg by mouth 2 (two) times daily. Take with  or immediately following a meal.   03/13/2017 at 8:45am  . OVER THE COUNTER MEDICATION Take 15 mLs by mouth daily.   Past Week at Unknown time  . vitamin B-12 (CYANOCOBALAMIN) 500 MCG tablet Take 500 mcg by mouth daily.   03/13/2017 at Unknown time  . warfarin (COUMADIN) 1 MG tablet Take 1.5 mg by mouth daily.   03/09/2017 at 8pm  . warfarin (COUMADIN) 2 MG tablet Take 2 mg by mouth every Tuesday.    03/07/2017 at 8pm   Review of Systems - Positive for Chronic leg edema worse recently, marked fatigue, decreased appetite, bilateral weakness in the lower extremity. Negative for bleeding diathesis, dark stools, change in bowel habits, no headache or visual disturbances, no  hemoptysis.    Physical Exam: Blood pressure (!) 116/58, pulse 98, temperature 99.1 F (37.3 C), temperature source Axillary, resp. rate 16, height 5\' 3"  (1.6 m), weight 72.3 kg (159 lb 6.3 oz), SpO2 100 %.   General appearance: alert, cooperative, appears stated age, fatigued and no distress Lungs: Bilateral basal coarse crackles present left worse on the right. Heart: S1 is variable, S2 is normal. 2/6 midsystolic murmur in the tricuspid area. JVD elevated to the angle of jaw, hepatojugular reflux present. Abdomen: soft, non-tender; bowel sounds normal; no masses,  no organomegaly and Obese Ileostomy bag left iliac fossa noted and appears healthy. Extremities: 2 plus edema + Pulses: Carotid pulses normal, femoral pulse normal, popliteal pulse., Absent pedal pulses. Absent hair, thin shiny skin. No evidence of acute arterial insufficiency. Neurologic: Grossly normal  Labs:  Lab Results  Component Value Date   WBC 7.2 03/14/2017   HGB 11.4 (L) 03/14/2017   HCT 38.5 03/14/2017   MCV 96.7 03/14/2017   PLT 246 03/14/2017    Recent Labs Lab 03/13/17 1835  03/15/17 0311  NA 151*  < > 152*  K 3.6  < > 3.6  CL 110  < > 108  CO2 34*  < > 37*  BUN 47*  < > 40*  CREATININE 1.59*  < > 1.47*  CALCIUM 8.2*  < > 8.1*  PROT 7.1  --   --   BILITOT 0.9  --   --   ALKPHOS 51  --   --   ALT 11*  --   --   AST 23  --   --   GLUCOSE 115*  < > 80  < > = values in this interval not displayed.  BNP (last 3 results)  Recent Labs  03/13/17 1835  BNP 761.6*    HEMOGLOBIN A1C No results found for: HGBA1C, MPG  Cardiac Panel (last 3 results) No results for input(s): CKTOTAL, CKMB, TROPONINI, RELINDX in the last 8760 hours.  No results found for: CKTOTAL, CKMB, CKMBINDEX, TROPONINI   TSH  Recent Labs  03/14/17 1010  TSH 2.077    Radiology: Ct Abdomen Pelvis Wo Contrast  Result Date: 03/13/2017 CLINICAL DATA:  Abdominal distention and pain. EXAM: CT ABDOMEN AND PELVIS WITHOUT  CONTRAST TECHNIQUE: Multidetector CT imaging of the abdomen and pelvis was performed following the standard protocol without IV contrast. COMPARISON:  None. FINDINGS: Lower chest: Large right pleural effusion is noted with adjacent subsegmental atelectasis. Hepatobiliary: No gallstones are noted. No focal abnormality is noted in the liver. Pancreas: Unremarkable. No pancreatic ductal dilatation or surrounding inflammatory changes. Spleen: Normal in size without focal abnormality. Adrenals/Urinary Tract: Adrenal glands are unremarkable. Large left renal cysts are noted. No hydronephrosis or renal obstruction is noted. Urinary  bladder is unremarkable. Stomach/Bowel: Stomach is unremarkable. Colostomy is noted in left lower quadrant. Diverticulosis is noted throughout the colon without inflammation. Vascular/Lymphatic: Aortic atherosclerosis. No enlarged abdominal or pelvic lymph nodes. Reproductive: Uterus and bilateral adnexa are unremarkable. Other: Mild amount of free fluid is noted in the pelvis. Fluid is also noted around the liver and spleen. Musculoskeletal: No acute or significant osseous findings. IMPRESSION: Large right pleural effusion with adjacent subsegmental atelectasis. Large left renal cysts. Colostomy seen in left lower quadrant. Aortic atherosclerosis. Mild ascites is noted. Electronically Signed   By: Lupita Raider, M.D.   On: 03/13/2017 20:25   Dg Chest 1 View  Result Date: 03/14/2017 CLINICAL DATA:  Status post thoracentesis EXAM: CHEST 1 VIEW COMPARISON:  Chest radiograph 03/13/2017 FINDINGS: Status post right thoracentesis with decreased amount of pleural fluid. Right basilar opacities may indicate a degree of re-expansion pulmonary edema. No pneumothorax. Unchanged small left pleural effusion and associated atelectasis. IMPRESSION: Decreased size of right pleural effusion without post thoracentesis pneumothorax. Electronically Signed   By: Deatra Robinson M.D.   On: 03/14/2017 16:20   Ct  Head Wo Contrast  Result Date: 03/13/2017 CLINICAL DATA:  Altered level of consciousness. Urinary symptoms for 1 week. History of hypertension and atrial fibrillation. EXAM: CT HEAD WITHOUT CONTRAST TECHNIQUE: Contiguous axial images were obtained from the base of the skull through the vertex without intravenous contrast. COMPARISON:  None. FINDINGS: BRAIN: No intraparenchymal hemorrhage, mass effect nor midline shift. The ventricles and sulci are normal for age. Patchy supratentorial white matter hypodensities within normal range for patient's age, though non-specific are most compatible with chronic small vessel ischemic disease. Old bilateral basal ganglia and thalamus lacunar infarcts. Symmetric basal ganglia mineralization. No acute large vascular territory infarcts. No abnormal extra-axial fluid collections. Basal cisterns are patent. VASCULAR: Moderate calcific atherosclerosis of the carotid siphons. 7 mm LEFT carotid terminus aneurysm. SKULL: No skull fracture. No significant scalp soft tissue swelling. SINUSES/ORBITS: The mastoid air-cells and included paranasal sinuses are well-aerated.The included ocular globes and orbital contents are non-suspicious. OTHER: None. IMPRESSION: 1. No acute intracranial process. 2. Moderate chronic small vessel ischemic disease and old lacunar infarcts. 3. 7 mm LEFT carotid terminus aneurysm would be better assessed on CT or MR angiogram on nonemergent basis. Electronically Signed   By: Awilda Metro M.D.   On: 03/13/2017 20:22   Dg Swallowing Func-speech Pathology  Result Date: 03/15/2017 Objective Swallowing Evaluation: Type of Study: MBS-Modified Barium Swallow Study Patient Details Name: Lorrin Bodner MRN: [id number] Date of Birth: 1921/07/07 Today's Date: 03/15/2017 Time: SLP Start Time (ACUTE ONLY): 0900-SLP Stop Time (ACUTE ONLY): 0919 SLP Time Calculation (min) (ACUTE ONLY): 19 min Past Medical History: Past Medical History: Diagnosis Date . Atrial fibrillation  (HCC)  . Coronary artery disease  . Hypertension  Past Surgical History: Past Surgical History: Procedure Laterality Date . COLOSTOMY   HPI: 83 yo female adm to [facility] with weakness, confusion, somnolence - pt diagnosed with Urinary Tract Infection.  PMH + for old lacunar CVAs - bilateral basal ganglia, thalamic CVA.  Per daughter Colvin Caroli  - pt has been coughing with intake at home and reports issues with "feeling full" pointing to proximal esophagus.  Pt denies this being a significant issue.  She has resided with daughter for the last 18 months.  Pt with ? ascities per imaging study.  CXR showed large right pleural effusion - diagnosed with respiratory acidosis - for thoracentesis today.   Subjective: pt awake in bed, desires liquid  intake Assessment / Plan / Recommendation CHL IP CLINICAL IMPRESSIONS 03/15/2017 Clinical Impression Pt with mild oropharyngeal dysphagia with delayed oral transiting/coordination due to lingual discoordination.  Pharyngeal swallow is timely without aspiration/penetration nor significant residuals.  Minimal pharyngeal residuals presents which pt does not consistently sense but CUED dry swallows helpful to decrease.  Pt does take large boluses - most notably liquids - suspect more of self feeding deficit.  Recommend dys1/thin with strict precautions. Using live video, educated pt and daughter to findings/recommendations.  Spoke to MD and obtained diet order and posted signs in pt's room.  Of note, pt does become fatigued easily and may benefit from nutritional supplements.  SLP Visit Diagnosis Dysphagia, oropharyngeal phase (R13.12);Dysphagia, pharyngoesophageal phase (R13.14) Attention and concentration deficit following -- Frontal lobe and executive function deficit following -- Impact on safety and function Mild aspiration risk;Risk for inadequate nutrition/hydration   CHL IP TREATMENT RECOMMENDATION 03/15/2017 Treatment Recommendations F/U MBS in --- days (Comment)   Prognosis 03/15/2017  Prognosis for Safe Diet Advancement Fair Barriers to Reach Goals -- Barriers/Prognosis Comment -- CHL IP DIET RECOMMENDATION 03/15/2017 SLP Diet Recommendations Dysphagia 1 (Puree) solids;Thin liquid Liquid Administration via Cup;Straw Medication Administration Whole meds with puree Compensations Slow rate;Small sips/bites Postural Changes --   CHL IP OTHER RECOMMENDATIONS 03/15/2017 Recommended Consults -- Oral Care Recommendations -- Other Recommendations Order thickener from pharmacy   CHL IP FOLLOW UP RECOMMENDATIONS 03/15/2017 Follow up Recommendations (No Data)   CHL IP FREQUENCY AND DURATION 03/15/2017 Speech Therapy Frequency (ACUTE ONLY) min 2x/week Treatment Duration 1 week      CHL IP ORAL PHASE 03/15/2017 Oral Phase Impaired Oral - Pudding Teaspoon -- Oral - Pudding Cup -- Oral - Honey Teaspoon -- Oral - Honey Cup -- Oral - Nectar Teaspoon -- Oral - Nectar Cup Premature spillage;Weak lingual manipulation Oral - Nectar Straw -- Oral - Thin Teaspoon Weak lingual manipulation;Premature spillage Oral - Thin Cup Weak lingual manipulation;Premature spillage Oral - Thin Straw Premature spillage;Weak lingual manipulation Oral - Puree Premature spillage;Weak lingual manipulation;Decreased bolus cohesion Oral - Mech Soft Weak lingual manipulation;Premature spillage;Decreased bolus cohesion Oral - Regular -- Oral - Multi-Consistency -- Oral - Pill Weak lingual manipulation;Premature spillage Oral Phase - Comment --  CHL IP PHARYNGEAL PHASE 03/15/2017 Pharyngeal Phase Impaired Pharyngeal- Pudding Teaspoon -- Pharyngeal -- Pharyngeal- Pudding Cup -- Pharyngeal -- Pharyngeal- Honey Teaspoon -- Pharyngeal -- Pharyngeal- Honey Cup -- Pharyngeal -- Pharyngeal- Nectar Teaspoon WFL Pharyngeal -- Pharyngeal- Nectar Cup Pharyngeal residue - valleculae Pharyngeal -- Pharyngeal- Nectar Straw -- Pharyngeal -- Pharyngeal- Thin Teaspoon WFL Pharyngeal -- Pharyngeal- Thin Cup WFL;Pharyngeal residue - valleculae Pharyngeal -- Pharyngeal- Thin  Straw WFL;Pharyngeal residue - valleculae Pharyngeal -- Pharyngeal- Puree Delayed swallow initiation-vallecula Pharyngeal -- Pharyngeal- Mechanical Soft Delayed swallow initiation-vallecula Pharyngeal -- Pharyngeal- Regular -- Pharyngeal -- Pharyngeal- Multi-consistency -- Pharyngeal -- Pharyngeal- Pill WFL Pharyngeal -- Pharyngeal Comment --  CHL IP CERVICAL ESOPHAGEAL PHASE 03/15/2017 Cervical Esophageal Phase Impaired Pudding Teaspoon -- Pudding Cup -- Honey Teaspoon -- Honey Cup -- Nectar Teaspoon -- Nectar Cup -- Nectar Straw -- Thin Teaspoon -- Thin Cup -- Thin Straw -- Puree -- Mechanical Soft -- Regular -- Multi-consistency -- Pill -- Cervical Esophageal Comment appearance of adequate clearance of barium = radiologist not present to confirm No flowsheet data found. Chales Abrahams 03/15/2017, 11:33 AM  Donavan Burnet, MS [facility] SLP [phone]             US Thoracentesis Asp Pleural Space W/img Guide  Result Date: 03/14/2017 INDICATION:  Decompensated heart failure with a right-sided pleural effusion. Request is made for diagnostic and therapeutic thoracentesis. EXAM: ULTRASOUND GUIDED DIAGNOSTIC AND THERAPEUTIC THORACENTESIS MEDICATIONS: 1% lidocaine COMPLICATIONS: None immediate. PROCEDURE: An ultrasound guided thoracentesis was thoroughly discussed with the patient's son and questions answered. The benefits, risks, alternatives and complications were also discussed. The patient's son understands and wishes to proceed with the procedure. Telephone consent was obtained. Ultrasound was performed to localize and mark an adequate pocket of fluid in the right chest. The area was then prepped and draped in the normal sterile fashion. 1% Lidocaine was used for local anesthesia. Under ultrasound guidance a Safe-T-Centesis catheter was introduced. Thoracentesis was performed. The catheter was removed and a dressing applied. FINDINGS: A total of approximately 0.6 L of serous fluid was removed. Samples were sent to the  laboratory as requested by the clinical team. IMPRESSION: Successful ultrasound guided right thoracentesis yielding 0.6 L of pleural fluid. Read by: Barnetta Chapel, PA-C Electronically Signed   By: Corlis Leak M.D.   On: 03/14/2017 16:20    Scheduled Meds: . atorvastatin  10 mg Oral q1800  . cyanocobalamin  500 mcg Oral Daily  . furosemide  40 mg Intravenous Daily  . metoprolol tartrate  2.5 mg Intravenous Q6H  . sodium chloride flush  3 mL Intravenous Q12H  . Warfarin - Pharmacist Dosing Inpatient   Does not apply q1800   Continuous Infusions: . sodium chloride    . ceFEPime (MAXIPIME) IV Stopped (03/15/17 1744)   PRN Meds:.sodium chloride, acetaminophen **OR** acetaminophen, ondansetron **OR** ondansetron (ZOFRAN) IV, RESOURCE THICKENUP CLEAR, sodium chloride flush  CARDIAC STUDIES:  EKG: Atrial fibrillation with rapid ventricle response. LVH with repolarization abnormality.  Echo: 03/14/2017: Hyperdynamic LV, EF 65-70%, unable to evaluate diastolic function, mildly dilated left atrium, right ventricle mildly dilated with low normal RV systolic function, moderate TR, moderate to severe pulmonary hypertension.  ASSESSMENT AND PLAN:  1. Chronic atrial fibrillation, now RVR probably related to underlying sepsis CHA2DS2-VASCScore: Risk Score  4,  Yearly risk of stroke  4. Recommendation: ASA No/Anticoagulation Yes 2. Shortness of breath and dyspnea on exertion 3. Acute on chronic diastolic heart failure 4. Moderate to severe pulmonary hypertension, probably secondary to #2 and 3 5. Hypertension 6. Chronic stage III kidney disease, stable Recommendation: She is appropriately being treated with gentle doses of furosemide. I will switch her to by mouth medications, she still active heart failure would recommend continuing IV furosemide for 48 hours followed by switching to by mouth. I will also start her on oral beta blockers along with CCB. She has difficulty in controlling her heart rate,  was on diltiazem and metoprolol combination in the outpatient basis and will reinitiate the same. In 24-48 hours, once stable, switch her to by mouth 40 mg Lasix on a daily basis. Please call if questions.  She was previously on losartan, agree with holding this for now. I will start the patient back on ARB once is here back in the office.  Yates Decamp, MD 03/15/2017, 7:42 PM [facility] Pager: [phone] Office: [phone] If no answer Cell [phone]

## 2017-03-15 NOTE — Progress Notes (Signed)
MBS completed, full report to follow.  Pt with mild oropharyngeal dysphagia with delayed oral transiting/coordination due to lingual discoordination.  Pharyngeal swallow is timely without aspiration/penetration nor significant residuals.  Minimal pharyngeal residuals presents which pt does not consistently sense but CUED dry swallows helpful to decrease.  Pt does take large boluses - most notably liquids - suspect more of self feeding deficit.  Recommend dys1/thin with strict precautions. Using live video, educated pt and daughter to findings/recommendations.  Spoke to MD and obtained diet order and posted signs in pt's room.  Of note, pt does become fatigued easily and may benefit from nutritional supplements.  Donavan Burnetamara Kalel Harty, MS [facility] SLP [phone]

## 2017-03-15 NOTE — Progress Notes (Signed)
Patient resting comfortably on 2L [place]. BIPAP is not needed at this time. RT will monitor as needed. 

## 2017-03-15 NOTE — Consult Note (Deleted)
Cardiology Consultation:   Patient ID: Emily Elliott; [id number]; 1920/10/10   Admit date: 03/13/2017 Date of Consult: 03/15/2017  Primary Care Provider: Patient, No Pcp Per   Patient Profile:     History of Present Illness:   Emily Elliott   Past Medical History:  Diagnosis Date  . Atrial fibrillation (HCC)   . Coronary artery disease   . Hypertension     Past Surgical History:  Procedure Laterality Date  . COLOSTOMY       Inpatient Medications: Scheduled Meds: . atorvastatin  10 mg Oral q1800  . cyanocobalamin  500 mcg Oral Daily  . furosemide  40 mg Intravenous Daily  . metoprolol tartrate  2.5 mg Intravenous Q6H  . sodium chloride flush  3 mL Intravenous Q12H  . Warfarin - Pharmacist Dosing Inpatient   Does not apply q1800   Continuous Infusions: . sodium chloride    . ceFEPime (MAXIPIME) IV Stopped (03/14/17 1804)   PRN Meds: sodium chloride, acetaminophen **OR** acetaminophen, ondansetron **OR** ondansetron (ZOFRAN) IV, RESOURCE THICKENUP CLEAR, sodium chloride flush  Allergies:    Allergies  Allergen Reactions  . Benadryl [Diphenhydramine]     rash    Social History:   Social History   Social History  . Marital status: Widowed    Spouse name: N/A  . Number of children: N/A  . Years of education: N/A   Occupational History  . Not on file.   Social History Main Topics  . Smoking status: Never Smoker  . Smokeless tobacco: Never Used  . Alcohol use No  . Drug use: No  . Sexual activity: Not on file   Other Topics Concern  . Not on file   Social History Narrative  . No narrative on file    Family History:   History reviewed. No pertinent family history.   ROS:   ROS    Physical Exam/Data:   Vitals:   03/15/17 0600 03/15/17 0723 03/15/17 0800 03/15/17 1200  BP: (!) 144/75  (!) 152/92 (!) 144/83  Pulse: 76  87 83  Resp: 15  16 (!) 25  Temp:  97.6 F (36.4 C)  97.8 F (36.6 C)  TempSrc:  Oral  Oral  SpO2: 100%  98% 100%    Weight:      Height:        Intake/Output Summary (Last 24 hours) at 03/15/17 1257 Last data filed at 03/15/17 1200  Gross per 24 hour  Intake               63 ml  Output             2150 ml  Net            -2087 ml   Filed Weights   03/13/17 1644 03/14/17 0000 03/14/17 0700  Weight: 140 lb (63.5 kg) 159 lb 9.8 oz (72.4 kg) 159 lb 6.3 oz (72.3 kg)   Body mass index is 28.24 kg/m.  Laboratory Data:  Chemistry Recent Labs Lab 03/13/17 1835 03/14/17 0325 03/15/17 0311  NA 151* 150* 152*  K 3.6 3.9 3.6  CL 110 112* 108  CO2 34* 32 37*  GLUCOSE 115* 94 80  BUN 47* 43* 40*  CREATININE 1.59* 1.49* 1.47*  CALCIUM 8.2* 7.8* 8.1*  GFRNONAA 27* 29* 30*  GFRAA 31* 34* 34*  ANIONGAP 7 6 7      Recent Labs Lab 03/13/17 1835  PROT 7.1  ALBUMIN 3.4*  AST 23  ALT 11*  ALKPHOS  51  BILITOT 0.9   Hematology Recent Labs Lab 03/13/17 1835 03/14/17 0325  WBC 7.3 7.2  RBC 3.88 3.98  HGB 10.9* 11.4*  HCT 37.1 38.5  MCV 95.6 96.7  MCH 28.1 28.6  MCHC 29.4* 29.6*  RDW 19.6* 19.5*  PLT 280 246   Cardiac EnzymesNo results for input(s): TROPONINI in the last 168 hours.  Recent Labs Lab 03/13/17 1839  TROPIPOC 0.08    BNP Recent Labs Lab 03/13/17 1835  BNP 761.6*    DDimer No results for input(s): DDIMER in the last 168 hours.  Radiology/Studies:  Ct Abdomen Pelvis Wo Contrast  Result Date: 03/13/2017 CLINICAL DATA:  Abdominal distention and pain. EXAM: CT ABDOMEN AND PELVIS WITHOUT CONTRAST TECHNIQUE: Multidetector CT imaging of the abdomen and pelvis was performed following the standard protocol without IV contrast. COMPARISON:  None. FINDINGS: Lower chest: Large right pleural effusion is noted with adjacent subsegmental atelectasis. Hepatobiliary: No gallstones are noted. No focal abnormality is noted in the liver. Pancreas: Unremarkable. No pancreatic ductal dilatation or surrounding inflammatory changes. Spleen: Normal in size without focal abnormality.  Adrenals/Urinary Tract: Adrenal glands are unremarkable. Large left renal cysts are noted. No hydronephrosis or renal obstruction is noted. Urinary bladder is unremarkable. Stomach/Bowel: Stomach is unremarkable. Colostomy is noted in left lower quadrant. Diverticulosis is noted throughout the colon without inflammation. Vascular/Lymphatic: Aortic atherosclerosis. No enlarged abdominal or pelvic lymph nodes. Reproductive: Uterus and bilateral adnexa are unremarkable. Other: Mild amount of free fluid is noted in the pelvis. Fluid is also noted around the liver and spleen. Musculoskeletal: No acute or significant osseous findings. IMPRESSION: Large right pleural effusion with adjacent subsegmental atelectasis. Large left renal cysts. Colostomy seen in left lower quadrant. Aortic atherosclerosis. Mild ascites is noted. Electronically Signed   By: Lupita Raider, M.D.   On: 03/13/2017 20:25   Dg Chest 1 View  Result Date: 03/14/2017 CLINICAL DATA:  Status post thoracentesis EXAM: CHEST 1 VIEW COMPARISON:  Chest radiograph 03/13/2017 FINDINGS: Status post right thoracentesis with decreased amount of pleural fluid. Right basilar opacities may indicate a degree of re-expansion pulmonary edema. No pneumothorax. Unchanged small left pleural effusion and associated atelectasis. IMPRESSION: Decreased size of right pleural effusion without post thoracentesis pneumothorax. Electronically Signed   By: Deatra Robinson M.D.   On: 03/14/2017 16:20   Dg Chest 2 View  Result Date: 03/13/2017 CLINICAL DATA:  Hypoxia, possible UTI, history hypertension, coronary artery disease, atrial fibrillation EXAM: CHEST  2 VIEW COMPARISON:  None FINDINGS: Very low lung volumes. Borderline enlargement of cardiac silhouette. Mediastinal contours normal. RIGHT pleural effusion and basilar atelectasis versus infiltrate. Minimal atelectasis at LEFT base. Mild central peribronchial thickening. No pneumothorax. Bones demineralized. IMPRESSION:  Bronchitic changes with low lung volumes and mild LEFT basilar atelectasis. RIGHT pleural effusion with atelectasis versus consolidation at lower RIGHT lung. Electronically Signed   By: Ulyses Southward M.D.   On: 03/13/2017 18:15   Ct Head Wo Contrast  Result Date: 03/13/2017 CLINICAL DATA:  Altered level of consciousness. Urinary symptoms for 1 week. History of hypertension and atrial fibrillation. EXAM: CT HEAD WITHOUT CONTRAST TECHNIQUE: Contiguous axial images were obtained from the base of the skull through the vertex without intravenous contrast. COMPARISON:  None. FINDINGS: BRAIN: No intraparenchymal hemorrhage, mass effect nor midline shift. The ventricles and sulci are normal for age. Patchy supratentorial white matter hypodensities within normal range for patient's age, though non-specific are most compatible with chronic small vessel ischemic disease. Old bilateral basal ganglia and thalamus  lacunar infarcts. Symmetric basal ganglia mineralization. No acute large vascular territory infarcts. No abnormal extra-axial fluid collections. Basal cisterns are patent. VASCULAR: Moderate calcific atherosclerosis of the carotid siphons. 7 mm LEFT carotid terminus aneurysm. SKULL: No skull fracture. No significant scalp soft tissue swelling. SINUSES/ORBITS: The mastoid air-cells and included paranasal sinuses are well-aerated.The included ocular globes and orbital contents are non-suspicious. OTHER: None. IMPRESSION: 1. No acute intracranial process. 2. Moderate chronic small vessel ischemic disease and old lacunar infarcts. 3. 7 mm LEFT carotid terminus aneurysm would be better assessed on CT or MR angiogram on nonemergent basis. Electronically Signed   By: Awilda Metroourtnay  Bloomer M.D.   On: 03/13/2017 20:22   Dg Swallowing Func-speech Pathology  Result Date: 03/15/2017 Objective Swallowing Evaluation: Type of Study: MBS-Modified Barium Swallow Study Patient Details Name: Emily Elliott MRN: [id number] Date of Birth:  05/03/1921 Today's Date: 03/15/2017 Time: SLP Start Time (ACUTE ONLY): 0900-SLP Stop Time (ACUTE ONLY): 0919 SLP Time Calculation (min) (ACUTE ONLY): 19 min Past Medical History: Past Medical History: Diagnosis Date . Atrial fibrillation (HCC)  . Coronary artery disease  . Hypertension  Past Surgical History: Past Surgical History: Procedure Laterality Date . COLOSTOMY   HPI: 83 [facility], confusion, somnolence - pt diagnosed with Urinary Tract Infection.  PMH + for old lacunar CVAs - bilateral basal ganglia, thalamic CVA.  Per daughter [name]  - pt has been coughing with intake at home and reports issues with "feeling full" pointing to proximal esophagus.  Pt denies this being a significant issue.  She has resided with daughter for the last 18 months.  Pt with ? ascities per imaging study.  CXR showed large right pleural effusion - diagnosed with respiratory acidosis - for thoracentesis today.   Subjective: pt awake in bed, desires liquid intake Assessment / Plan / Recommendation CHL IP CLINICAL IMPRESSIONS 03/15/2017 Clinical Impression Pt with mild oropharyngeal dysphagia with delayed oral transiting/coordination due to lingual discoordination.  Pharyngeal swallow is timely without aspiration/penetration nor significant residuals.  Minimal pharyngeal residuals presents which pt does not consistently sense but CUED dry swallows helpful to decrease.  Pt does take large boluses - most notably liquids - suspect more of self feeding deficit.  Recommend dys1/thin with strict precautions. Using live video, educated pt and daughter to findings/recommendations.  Spoke to MD and obtained diet order and posted signs in pt's room.  Of note, pt does become fatigued easily and may benefit from nutritional supplements.  SLP Visit Diagnosis Dysphagia, oropharyngeal phase (R13.12);Dysphagia, pharyngoesophageal phase (R13.14) Attention and concentration deficit following -- Frontal lobe and executive function  deficit following -- Impact on safety and function Mild aspiration risk;Risk for inadequate nutrition/hydration   CHL IP TREATMENT RECOMMENDATION 03/15/2017 Treatment Recommendations F/U MBS in --- days (Comment)   Prognosis 03/15/2017 Prognosis for Safe Diet Advancement Fair Barriers to Reach Goals -- Barriers/Prognosis Comment -- CHL IP DIET RECOMMENDATION 03/15/2017 SLP Diet Recommendations Dysphagia 1 (Puree) solids;Thin liquid Liquid Administration via Cup;Straw Medication Administration Whole meds with puree Compensations Slow rate;Small sips/bites Postural Changes --   CHL IP OTHER RECOMMENDATIONS 03/15/2017 Recommended Consults -- Oral Care Recommendations -- Other Recommendations Order thickener from pharmacy   CHL IP FOLLOW UP RECOMMENDATIONS 03/15/2017 Follow up Recommendations (No Data)   CHL IP FREQUENCY AND DURATION 03/15/2017 Speech Therapy Frequency (ACUTE ONLY) min 2x/week Treatment Duration 1 week      CHL IP ORAL PHASE 03/15/2017 Oral Phase Impaired Oral - Pudding Teaspoon -- Oral - Pudding Cup --  Oral - Honey Teaspoon -- Oral - Honey Cup -- Oral - Nectar Teaspoon -- Oral - Nectar Cup Premature spillage;Weak lingual manipulation Oral - Nectar Straw -- Oral - Thin Teaspoon Weak lingual manipulation;Premature spillage Oral - Thin Cup Weak lingual manipulation;Premature spillage Oral - Thin Straw Premature spillage;Weak lingual manipulation Oral - Puree Premature spillage;Weak lingual manipulation;Decreased bolus cohesion Oral - Mech Soft Weak lingual manipulation;Premature spillage;Decreased bolus cohesion Oral - Regular -- Oral - Multi-Consistency -- Oral - Pill Weak lingual manipulation;Premature spillage Oral Phase - Comment --  CHL IP PHARYNGEAL PHASE 03/15/2017 Pharyngeal Phase Impaired Pharyngeal- Pudding Teaspoon -- Pharyngeal -- Pharyngeal- Pudding Cup -- Pharyngeal -- Pharyngeal- Honey Teaspoon -- Pharyngeal -- Pharyngeal- Honey Cup -- Pharyngeal -- Pharyngeal- Nectar Teaspoon WFL Pharyngeal -- Pharyngeal-  Nectar Cup Pharyngeal residue - valleculae Pharyngeal -- Pharyngeal- Nectar Straw -- Pharyngeal -- Pharyngeal- Thin Teaspoon WFL Pharyngeal -- Pharyngeal- Thin Cup WFL;Pharyngeal residue - valleculae Pharyngeal -- Pharyngeal- Thin Straw WFL;Pharyngeal residue - valleculae Pharyngeal -- Pharyngeal- Puree Delayed swallow initiation-vallecula Pharyngeal -- Pharyngeal- Mechanical Soft Delayed swallow initiation-vallecula Pharyngeal -- Pharyngeal- Regular -- Pharyngeal -- Pharyngeal- Multi-consistency -- Pharyngeal -- Pharyngeal- Pill WFL Pharyngeal -- Pharyngeal Comment --  CHL IP CERVICAL ESOPHAGEAL PHASE 03/15/2017 Cervical Esophageal Phase Impaired Pudding Teaspoon -- Pudding Cup -- Honey Teaspoon -- Honey Cup -- Nectar Teaspoon -- Nectar Cup -- Nectar Straw -- Thin Teaspoon -- Thin Cup -- Thin Straw -- Puree -- Mechanical Soft -- Regular -- Multi-consistency -- Pill -- Cervical Esophageal Comment appearance of adequate clearance of barium = radiologist not present to confirm No flowsheet data found. Chales Abrahams 03/15/2017, 11:33 AM  Donavan Burnet, MS [facility] SLP [phone]             US Thoracentesis Asp Pleural Space W/img Guide  Result Date: 03/14/2017 INDICATION: Decompensated heart failure with a right-sided pleural effusion. Request is made for diagnostic and therapeutic thoracentesis. EXAM: ULTRASOUND GUIDED DIAGNOSTIC AND THERAPEUTIC THORACENTESIS MEDICATIONS: 1% lidocaine COMPLICATIONS: None immediate. PROCEDURE: An ultrasound guided thoracentesis was thoroughly discussed with the patient's son and questions answered. The benefits, risks, alternatives and complications were also discussed. The patient's son understands and wishes to proceed with the procedure. Telephone consent was obtained. Ultrasound was performed to localize and mark an adequate pocket of fluid in the right chest. The area was then prepped and draped in the normal sterile fashion. 1% Lidocaine was used for local anesthesia. Under  ultrasound guidance a Safe-T-Centesis catheter was introduced. Thoracentesis was performed. The catheter was removed and a dressing applied. FINDINGS: A total of approximately 0.6 L of serous fluid was removed. Samples were sent to the laboratory as requested by the clinical team. IMPRESSION: Successful ultrasound guided right thoracentesis yielding 0.6 L of pleural fluid. Read by: Barnetta Chapel, PA-C Electronically Signed   By: Corlis Leak M.D.   On: 03/14/2017 16:20    Assessment and Plan:     Jolene Provost, PA-C  03/15/2017 12:57 PM

## 2017-03-15 NOTE — Progress Notes (Signed)
ANTICOAGULATION CONSULT NOTE   Pharmacy Consult for warfarin Indication: hx atrial fibrillation  Allergies  Allergen Reactions  . Benadryl [Diphenhydramine]     rash    Patient Measurements: Height: 5\' 3"  (160 cm) Weight: 159 lb 6.3 oz (72.3 kg) IBW/kg (Calculated) : 52.4   Vital Signs: Temp: 97.6 F (36.4 C) (08/08 0723) Temp Source: Oral (08/08 0723) BP: 144/75 (08/08 0600) Pulse Rate: 76 (08/08 0600)  Labs:  Recent Labs  03/13/17 1835 03/13/17 2058 03/14/17 0325 03/15/17 0311  HGB 10.9*  --  11.4*  --   HCT 37.1  --  38.5  --   PLT 280  --  246  --   LABPROT  --  56.8* 53.6* 58.0*  INR  --  6.20* 5.77* 6.37*  CREATININE 1.59*  --  1.49* 1.47*    Estimated Creatinine Clearance: 22.8 mL/min (A) (by C-G formula based on SCr of 1.47 mg/dL (H)).   Medical History: Past Medical History:  Diagnosis Date  . Atrial fibrillation (HCC)   . Coronary artery disease   . Hypertension     Medications:  Home warfarin regimen:1.5 mg daily except 2 mg on Tuesdays  Assessment: Patient's a 83 y.o F with hx afib on warfarin PTA, presented to the ED on 03/13/17 with c/o dysuria.  To resume warfarin while patient is hospitalized.  Of note, patient was on cipro PTA and was instructed by her PCP to hold warfarin (d/t elevated INR secondary to drug-drug intxns) until she has completed her course of cipro (last warfarin dose taken on 8/2, scheduled to resume warfarin back on 03/14/17).   Today, 03/15/2017: - INR is elevated at 6.37 (increasing) - hgb 11.4, plt ok yesterday - no bleeding reported - s/p thoracentesis yesterday  Goal of Therapy:  INR 2-3 Monitor platelets by anticoagulation protocol: Yes   Plan:  - hold warfarin for now.  Will f/u with INR and resume when appropriate. - daily INR - monitor for s/s of bleeding  Loralee PacasErin Jawuan Robb, PharmD, BCPS Pager: [phone] 03/15/2017 8:06 AM

## 2017-03-15 NOTE — Progress Notes (Signed)
Modified Barium Swallow Progress Note  Patient Details  Name: Emily Elliott MRN: [id number] Date of Birth: 1920/12/12  Today's Date: 03/15/2017  Modified Barium Swallow completed.  Full report located under Chart Review in the Imaging Section.  Brief recommendations include the following:  Clinical Impression  Pt with mild oropharyngeal dysphagia with delayed oral transiting/coordination due to lingual discoordination.  Pharyngeal swallow is timely without aspiration/penetration nor significant residuals.  Minimal pharyngeal residuals presents which pt does not consistently sense but CUED dry swallows helpful to decrease.  Pt does take large boluses - most notably liquids - suspect more of self feeding deficit.  Recommend dys1/thin with strict precautions. Using live video, educated pt and daughter to findings/recommendations.  Spoke to MD and obtained diet order and posted signs in pt's room.  Of note, pt does become fatigued easily and may benefit from nutritional supplements.    Swallow Evaluation Recommendations       SLP Diet Recommendations: Dysphagia 1 (Puree) solids;Thin liquid (creamy puree)   Liquid Administration via: Cup;Straw   Medication Administration: Whole meds with puree   Supervision: Patient able to self feed   Compensations: Slow rate;Small sips/bites              Emily Burnetamara Krayton Wortley, MS [facility] SLP [phone]  Emily Elliott, Emily Elliott 03/15/2017,11:32 AM

## 2017-03-15 NOTE — Progress Notes (Signed)
Pt. currently tolerating n/c well at this time, RT top monitor.

## 2017-03-15 NOTE — Progress Notes (Signed)
PROGRESS NOTE  Emily Elliott ZOX:[id number] DOB: 1920/12/20 DOA: 03/13/2017 PCP: Patient, No Pcp Per   LOS: 2 days   Brief Narrative / Interim history: 83 year old female with a history of essential hypertension, chronic atrial fibrillation, colon cancer status post hemicolectomy, CKD stage III presenting with one-week history of increasing generalized weakness and lethargy. At baseline, the patient has cognitive impairment and requires assistance with her ADLs including bathing and dressing. However, the patient is able to feed herself and ambulate with a walker. She does require assistance getting out of bed and with transfers at baseline. In the past week, the family has noted increasing generalized weakness with increasing difficulty getting out of bed and bearing any weight. In addition, the patient has had decreased oral intake. The patient's family is also noted increasing "swelling" in her abdomen. The patient was placed on Cipro for presumptive UTI possibly one week prior to this admission. Because of persistent lethargy, she was seen by her primary care provider on 03/13/2017. Reportedly, urinalysis showed bacteriuria and nitrites. Because of concern for persistent UTI and continued lethargy, the patient strictly to the emergency department. In the emergency department, the patient was noted to be hypoxic with oxygen saturation 84% on room air. VBG showed hypercarbia and hypoxia.  She was subsequently placed on BiPAP and stabilized. Chest x-ray showed a right pleural effusion with RLL atelectasis/infiltrate. CT of the abdomen and pelvis showed a large right pleural effusion with adjacent atelectasis. There was a LLQ ostomy, but no other acute findings on CT of the abdomen. Ceftriaxone and azithromycin were given in the emergency department.  Assessment & Plan: Principal Problem:   UTI (urinary tract infection) Active Problems:   Atrial fibrillation, chronic (HCC)   Essential hypertension  Hypernatremia   AKI (acute kidney injury) (HCC)   Acute encephalopathy   History of colon cancer   Pleural effusion, right   Dysphagia   CKD (chronic kidney disease), stage III   Acute CHF (congestive heart failure) (HCC)   Coagulopathy (HCC)   Acute metabolic encephalopathy -Multifactorial due to UTI / decompensated diastolic CHF, hyponatremia -TSH normal -B12 elevated -Ammonia within normal limits -Patient more alert today,  Acute respiratory failure with hypoxia and hypercarbia -due to decompensated dCHF / pleural effusion -Status post 0.6 L thoracentesis on 8/7, respiratory status improving -Continue IV furosemide -Echocardiogram as below -Is not requiring BiPAP this morning, if she will not need BiPAP may be able to go to the floor tomorrow  Right pleural effusion -Likely due to CHF, cytology to evaluate for malignant effusion is pending.  Acute CHF, likely diastolic -She is clinically volume overloaded, started on furosemide, continue.  I have requested cardiology evaluation today.  2D echo done shows EF 65-70%, severe concentric hypertrophy.  She also showed decreased RV systolic function and biatrial dilation.  PA peak pressure was 61 -Unfortunately she has significant third spacing  Hypernatremia -Likely due to mild intravascular depletion, closely monitor while on diuretics  UTI -UA shows TNTC WBC -Continue cefepime -Cultures without significant growth, would favor to treat 3-5 days  CKD 3 -Baseline creatinine 1.1-1.3 -Presenting creatinine 1.59, creatinine stable 1.49 on 8/7 and 1.47 on 8/8 -Monitor with diuresis  Chronic atrial fibrillation -CHADSVASc = 7 -continue warfarin--currently INR supratherapeutic, no bleeding, closely monitor -rate controlled -continue metoprolol succinate and cardizem as BP allows  Dysphagia -Previously evaluated by speech therapy 09/26/16-->dysphagia 3 with thin -repeat eval when more alert  History of colon cancer  status post colostomy -Had colon resection in 2014 -  Consult wound care nurse for ostomy care -No history of chemoradiation therapy.  Cytology in the pleural fluid is pending  Essential hypertension -BP initially soft upon presentation -Continue diltiazem and metoprolol succinate as BP allows -Blood pressure is better now, continue IV metoprolol as well as furosemide. If she has consistent p.o. intake will switch to home regimen  Coagulopathy  -INR is supratherapeutic -Likely due to recent ciprofloxacin use and poor po intake  Elevated troponin -likely demand ischemia in setting of UTI and decompensated CHF   DVT prophylaxis: Coumadin Code Status: Full code Family Communication: d/w son over the phone (he is MD) Disposition Plan: remain in SDU today   Consultants:  Cardiology   Procedures:   2D echo Study Conclusions - Left ventricle: The cavity size was normal. There was severe concentric hypertrophy. Systolic function was vigorous. The estimated ejection fraction was in the range of 65% to 70%. Wall motion was normal; there were no regional wall motion abnormalities. The study is not technically sufficient to allow evaluation of LV diastolic function. - Mitral valve: Mildly thickened leaflets . There was trivial regurgitation. - Left atrium: Mildly dilated. - Right ventricle: The cavity size was mildly dilated. Low normal systolic function. - Right atrium: Moderately dilated. - Tricuspid valve: There was moderate regurgitation. - Pulmonic valve: There was mild regurgitation. - Pulmonary arteries: PA peak pressure: 61 mm Hg (S). - Inferior vena cava: The vessel was dilated. The respirophasic diameter changes were blunted (< 50%), consistent with elevated central venous pressure. - Pericardium, extracardiac: There was no pericardial effusion. There was a left pleural effusion.  Impressions:  - LVEF 65-70%, severe LVH, normal wall motion, mild LAE, moderate   RAE, mild  PR, moderate TR, RVSP 61 mmHg, dilated IVC, left   pleural effusion.  Antimicrobials:  Cefepime 8/6 >>   Subjective: - no chest pain, no abdominal pain, nausea or vomiting. Feels like her breathing has improved.  Objective: Vitals:   03/15/17 0400 03/15/17 0600 03/15/17 0723 03/15/17 0800  BP: (!) 161/95 (!) 144/75  (!) 152/92  Pulse:  76  87  Resp: 20 15  16   Temp:   97.6 F (36.4 C)   TempSrc:   Oral   SpO2: 100% 100%  98%  Weight:      Height:        Intake/Output Summary (Last 24 hours) at 03/15/17 1151 Last data filed at 03/15/17 1043  Gross per 24 hour  Intake               63 ml  Output             1450 ml  Net            -1387 ml   Filed Weights   03/13/17 1644 03/14/17 0000 03/14/17 0700  Weight: 63.5 kg (140 lb) 72.4 kg (159 lb 9.8 oz) 72.3 kg (159 lb 6.3 oz)    Examination:  Vitals:   03/15/17 0400 03/15/17 0600 03/15/17 0723 03/15/17 0800  BP: (!) 161/95 (!) 144/75  (!) 152/92  Pulse:  76  87  Resp: 20 15  16   Temp:   97.6 F (36.4 C)   TempSrc:   Oral   SpO2: 100% 100%  98%  Weight:      Height:        Constitutional: NAD Eyes: lids and conjunctivae normal ENMT: Mucous membranes are moist. No oropharyngeal exudates Respiratory: clear to auscultation bilaterally, no wheezing, no crackles. Normal respiratory effort. No  accessory muscle use.  Cardiovascular: irregular, no murmurs heard 2+ LE edema. 2+ pedal pulses. Abdomen: no tenderness. Bowel sounds positive.  Skin: no rashes, lesions, ulcers. No induration Neurologic: CN 2-12 grossly intact. Strength 5/5 in all 4.    Data Reviewed: I have independently reviewed following labs and imaging studies  EKG A fib CXR admission versus post paracentesis -improved pleural effusion, no pneumothorax  CBC:  Recent Labs Lab 03/13/17 1835 03/14/17 0325  WBC 7.3 7.2  NEUTROABS 6.0 5.4  HGB 10.9* 11.4*  HCT 37.1 38.5  MCV 95.6 96.7  PLT 280 246   Basic Metabolic Panel:  Recent Labs Lab  03/13/17 1835 03/14/17 0325 03/14/17 1010 03/15/17 0311  NA 151* 150*  --  152*  K 3.6 3.9  --  3.6  CL 110 112*  --  108  CO2 34* 32  --  37*  GLUCOSE 115* 94  --  80  BUN 47* 43*  --  40*  CREATININE 1.59* 1.49*  --  1.47*  CALCIUM 8.2* 7.8*  --  8.1*  MG  --   --  2.9* 2.7*   GFR: Estimated Creatinine Clearance: 22.8 mL/min (A) (by C-G formula based on SCr of 1.47 mg/dL (H)). Liver Function Tests:  Recent Labs Lab 03/13/17 1835  AST 23  ALT 11*  ALKPHOS 51  BILITOT 0.9  PROT 7.1  ALBUMIN 3.4*   No results for input(s): LIPASE, AMYLASE in the last 168 hours.  Recent Labs Lab 03/14/17 1010  AMMONIA 32   Coagulation Profile:  Recent Labs Lab 03/13/17 2058 03/14/17 0325 03/15/17 0311  INR 6.20* 5.77* 6.37*   Cardiac Enzymes: No results for input(s): CKTOTAL, CKMB, CKMBINDEX, TROPONINI in the last 168 hours. BNP (last 3 results) No results for input(s): PROBNP in the last 8760 hours. HbA1C: No results for input(s): HGBA1C in the last 72 hours. CBG: No results for input(s): GLUCAP in the last 168 hours. Lipid Profile: No results for input(s): CHOL, HDL, LDLCALC, TRIG, CHOLHDL, LDLDIRECT in the last 72 hours. Thyroid Function Tests:  Recent Labs  03/14/17 1010  TSH 2.077   Anemia Panel:  Recent Labs  03/14/17 1010  VITAMINB12 >7,500*   Urine analysis:    Component Value Date/Time   COLORURINE YELLOW 03/13/2017 2241   APPEARANCEUR HAZY (A) 03/13/2017 2241   LABSPEC 1.016 03/13/2017 2241   PHURINE 6.0 03/13/2017 2241   GLUCOSEU NEGATIVE 03/13/2017 2241   HGBUR NEGATIVE 03/13/2017 2241   BILIRUBINUR NEGATIVE 03/13/2017 2241   KETONESUR NEGATIVE 03/13/2017 2241   PROTEINUR 100 (A) 03/13/2017 2241   NITRITE NEGATIVE 03/13/2017 2241   LEUKOCYTESUR LARGE (A) 03/13/2017 2241   Sepsis Labs: Invalid input(s): PROCALCITONIN, LACTICIDVEN  Recent Results (from the past 240 hour(s))  Blood culture (routine x 2)     Status: None (Preliminary  result)   Collection Time: 03/13/17  6:40 PM  Result Value Ref Range Status   Specimen Description BLOOD LEFT FOREARM  Final   Special Requests   Final    BOTTLES DRAWN AEROBIC AND ANAEROBIC Blood Culture adequate volume   Culture   Final    NO GROWTH < 24 HOURS Performed at [facility], [address], [place], [place] [address]    Report Status PENDING  Incomplete  Urine culture     Status: Abnormal   Collection Time: 03/13/17 10:41 PM  Result Value Ref Range Status   Specimen Description URINE, CLEAN CATCH  Final   Special Requests Normal  Final  Culture (A)  Final    <10,000 COLONIES/mL INSIGNIFICANT GROWTH Performed at [facility], [address] [place], [place] [address]    Report Status 03/15/2017 FINAL  Final  MRSA PCR Screening     Status: None   Collection Time: 03/13/17 11:44 PM  Result Value Ref Range Status   MRSA by PCR NEGATIVE NEGATIVE Final    Comment:        The GeneXpert MRSA Assay (FDA approved for NASAL specimens only), is one component of a comprehensive MRSA colonization surveillance program. It is not intended to diagnose MRSA infection nor to guide or monitor treatment for MRSA infections.   Gram stain     Status: None   Collection Time: 03/14/17  4:04 PM  Result Value Ref Range Status   Specimen Description PLEURAL RIGHT  Final   Special Requests BOTTLES DRAWN AEROBIC AND ANAEROBIC  Final   Gram Stain   Final    WBC PRESENT, PREDOMINANTLY PMN NO ORGANISMS SEEN CYTOSPIN SMEAR Performed at [facility], [address] [place], [place] [address]    Report Status 03/14/2017 FINAL  Final      Radiology Studies: Ct Abdomen Pelvis Wo Contrast  Result Date: 03/13/2017 CLINICAL DATA:  Abdominal distention and pain. EXAM: CT ABDOMEN AND PELVIS WITHOUT CONTRAST TECHNIQUE: Multidetector CT imaging of the abdomen and pelvis was performed following the standard protocol without IV contrast. COMPARISON:  None. FINDINGS: Lower  chest: Large right pleural effusion is noted with adjacent subsegmental atelectasis. Hepatobiliary: No gallstones are noted. No focal abnormality is noted in the liver. Pancreas: Unremarkable. No pancreatic ductal dilatation or surrounding inflammatory changes. Spleen: Normal in size without focal abnormality. Adrenals/Urinary Tract: Adrenal glands are unremarkable. Large left renal cysts are noted. No hydronephrosis or renal obstruction is noted. Urinary bladder is unremarkable. Stomach/Bowel: Stomach is unremarkable. Colostomy is noted in left lower quadrant. Diverticulosis is noted throughout the colon without inflammation. Vascular/Lymphatic: Aortic atherosclerosis. No enlarged abdominal or pelvic lymph nodes. Reproductive: Uterus and bilateral adnexa are unremarkable. Other: Mild amount of free fluid is noted in the pelvis. Fluid is also noted around the liver and spleen. Musculoskeletal: No acute or significant osseous findings. IMPRESSION: Large right pleural effusion with adjacent subsegmental atelectasis. Large left renal cysts. Colostomy seen in left lower quadrant. Aortic atherosclerosis. Mild ascites is noted. Electronically Signed   By: Lupita RaiderJames  Green Jr, M.D.   On: 03/13/2017 20:25   Dg Chest 1 View  Result Date: 03/14/2017 CLINICAL DATA:  Status post thoracentesis EXAM: CHEST 1 VIEW COMPARISON:  Chest radiograph 03/13/2017 FINDINGS: Status post right thoracentesis with decreased amount of pleural fluid. Right basilar opacities may indicate a degree of re-expansion pulmonary edema. No pneumothorax. Unchanged small left pleural effusion and associated atelectasis. IMPRESSION: Decreased size of right pleural effusion without post thoracentesis pneumothorax. Electronically Signed   By: Deatra RobinsonKevin  Herman M.D.   On: 03/14/2017 16:20   Dg Chest 2 View  Result Date: 03/13/2017 CLINICAL DATA:  Hypoxia, possible UTI, history hypertension, coronary artery disease, atrial fibrillation EXAM: CHEST  2 VIEW  COMPARISON:  None FINDINGS: Very low lung volumes. Borderline enlargement of cardiac silhouette. Mediastinal contours normal. RIGHT pleural effusion and basilar atelectasis versus infiltrate. Minimal atelectasis at LEFT base. Mild central peribronchial thickening. No pneumothorax. Bones demineralized. IMPRESSION: Bronchitic changes with low lung volumes and mild LEFT basilar atelectasis. RIGHT pleural effusion with atelectasis versus consolidation at lower RIGHT lung. Electronically Signed   By: Ulyses SouthwardMark  Boles M.D.   On:  03/13/2017 18:15   Ct Head Wo Contrast  Result Date: 03/13/2017 CLINICAL DATA:  Altered level of consciousness. Urinary symptoms for 1 week. History of hypertension and atrial fibrillation. EXAM: CT HEAD WITHOUT CONTRAST TECHNIQUE: Contiguous axial images were obtained from the base of the skull through the vertex without intravenous contrast. COMPARISON:  None. FINDINGS: BRAIN: No intraparenchymal hemorrhage, mass effect nor midline shift. The ventricles and sulci are normal for age. Patchy supratentorial white matter hypodensities within normal range for patient's age, though non-specific are most compatible with chronic small vessel ischemic disease. Old bilateral basal ganglia and thalamus lacunar infarcts. Symmetric basal ganglia mineralization. No acute large vascular territory infarcts. No abnormal extra-axial fluid collections. Basal cisterns are patent. VASCULAR: Moderate calcific atherosclerosis of the carotid siphons. 7 mm LEFT carotid terminus aneurysm. SKULL: No skull fracture. No significant scalp soft tissue swelling. SINUSES/ORBITS: The mastoid air-cells and included paranasal sinuses are well-aerated.The included ocular globes and orbital contents are non-suspicious. OTHER: None. IMPRESSION: 1. No acute intracranial process. 2. Moderate chronic small vessel ischemic disease and old lacunar infarcts. 3. 7 mm LEFT carotid terminus aneurysm would be better assessed on CT or MR  angiogram on nonemergent basis. Electronically Signed   By: Awilda Metro M.D.   On: 03/13/2017 20:22   Dg Swallowing Func-speech Pathology  Result Date: 03/15/2017 Objective Swallowing Evaluation: Type of Study: MBS-Modified Barium Swallow Study Patient Details Name: Emily Elliott MRN: [id number] Date of Birth: 1921-02-08 Today's Date: 03/15/2017 Time: SLP Start Time (ACUTE ONLY): 0900-SLP Stop Time (ACUTE ONLY): 0919 SLP Time Calculation (min) (ACUTE ONLY): 19 min Past Medical History: Past Medical History: Diagnosis Date . Atrial fibrillation (HCC)  . Coronary artery disease  . Hypertension  Past Surgical History: Past Surgical History: Procedure Laterality Date . COLOSTOMY   HPI: 83 yo female adm to [facility] with weakness, confusion, somnolence - pt diagnosed with Urinary Tract Infection.  PMH + for old lacunar CVAs - bilateral basal ganglia, thalamic CVA.  Per daughter Colvin Caroli  - pt has been coughing with intake at home and reports issues with "feeling full" pointing to proximal esophagus.  Pt denies this being a significant issue.  She has resided with daughter for the last 18 months.  Pt with ? ascities per imaging study.  CXR showed large right pleural effusion - diagnosed with respiratory acidosis - for thoracentesis today.   Subjective: pt awake in bed, desires liquid intake Assessment / Plan / Recommendation CHL IP CLINICAL IMPRESSIONS 03/15/2017 Clinical Impression Pt with mild oropharyngeal dysphagia with delayed oral transiting/coordination due to lingual discoordination.  Pharyngeal swallow is timely without aspiration/penetration nor significant residuals.  Minimal pharyngeal residuals presents which pt does not consistently sense but CUED dry swallows helpful to decrease.  Pt does take large boluses - most notably liquids - suspect more of self feeding deficit.  Recommend dys1/thin with strict precautions. Using live video, educated pt and daughter to findings/recommendations.  Spoke to MD and obtained  diet order and posted signs in pt's room.  Of note, pt does become fatigued easily and may benefit from nutritional supplements.  SLP Visit Diagnosis Dysphagia, oropharyngeal phase (R13.12);Dysphagia, pharyngoesophageal phase (R13.14) Attention and concentration deficit following -- Frontal lobe and executive function deficit following -- Impact on safety and function Mild aspiration risk;Risk for inadequate nutrition/hydration   CHL IP TREATMENT RECOMMENDATION 03/15/2017 Treatment Recommendations F/U MBS in --- days (Comment)   Prognosis 03/15/2017 Prognosis for Safe Diet Advancement Fair Barriers to Reach Goals -- Barriers/Prognosis Comment -- CHL IP DIET  RECOMMENDATION 03/15/2017 SLP Diet Recommendations Dysphagia 1 (Puree) solids;Thin liquid Liquid Administration via Cup;Straw Medication Administration Whole meds with puree Compensations Slow rate;Small sips/bites Postural Changes --   CHL IP OTHER RECOMMENDATIONS 03/15/2017 Recommended Consults -- Oral Care Recommendations -- Other Recommendations Order thickener from pharmacy   CHL IP FOLLOW UP RECOMMENDATIONS 03/15/2017 Follow up Recommendations (No Data)   CHL IP FREQUENCY AND DURATION 03/15/2017 Speech Therapy Frequency (ACUTE ONLY) min 2x/week Treatment Duration 1 week      CHL IP ORAL PHASE 03/15/2017 Oral Phase Impaired Oral - Pudding Teaspoon -- Oral - Pudding Cup -- Oral - Honey Teaspoon -- Oral - Honey Cup -- Oral - Nectar Teaspoon -- Oral - Nectar Cup Premature spillage;Weak lingual manipulation Oral - Nectar Straw -- Oral - Thin Teaspoon Weak lingual manipulation;Premature spillage Oral - Thin Cup Weak lingual manipulation;Premature spillage Oral - Thin Straw Premature spillage;Weak lingual manipulation Oral - Puree Premature spillage;Weak lingual manipulation;Decreased bolus cohesion Oral - Mech Soft Weak lingual manipulation;Premature spillage;Decreased bolus cohesion Oral - Regular -- Oral - Multi-Consistency -- Oral - Pill Weak lingual manipulation;Premature  spillage Oral Phase - Comment --  CHL IP PHARYNGEAL PHASE 03/15/2017 Pharyngeal Phase Impaired Pharyngeal- Pudding Teaspoon -- Pharyngeal -- Pharyngeal- Pudding Cup -- Pharyngeal -- Pharyngeal- Honey Teaspoon -- Pharyngeal -- Pharyngeal- Honey Cup -- Pharyngeal -- Pharyngeal- Nectar Teaspoon WFL Pharyngeal -- Pharyngeal- Nectar Cup Pharyngeal residue - valleculae Pharyngeal -- Pharyngeal- Nectar Straw -- Pharyngeal -- Pharyngeal- Thin Teaspoon WFL Pharyngeal -- Pharyngeal- Thin Cup WFL;Pharyngeal residue - valleculae Pharyngeal -- Pharyngeal- Thin Straw WFL;Pharyngeal residue - valleculae Pharyngeal -- Pharyngeal- Puree Delayed swallow initiation-vallecula Pharyngeal -- Pharyngeal- Mechanical Soft Delayed swallow initiation-vallecula Pharyngeal -- Pharyngeal- Regular -- Pharyngeal -- Pharyngeal- Multi-consistency -- Pharyngeal -- Pharyngeal- Pill WFL Pharyngeal -- Pharyngeal Comment --  CHL IP CERVICAL ESOPHAGEAL PHASE 03/15/2017 Cervical Esophageal Phase Impaired Pudding Teaspoon -- Pudding Cup -- Honey Teaspoon -- Honey Cup -- Nectar Teaspoon -- Nectar Cup -- Nectar Straw -- Thin Teaspoon -- Thin Cup -- Thin Straw -- Puree -- Mechanical Soft -- Regular -- Multi-consistency -- Pill -- Cervical Esophageal Comment appearance of adequate clearance of barium = radiologist not present to confirm No flowsheet data found. Chales Abrahams 03/15/2017, 11:33 AM  Donavan Burnet, MS [facility] SLP [phone]             US Thoracentesis Asp Pleural Space W/img Guide  Result Date: 03/14/2017 INDICATION: Decompensated heart failure with a right-sided pleural effusion. Request is made for diagnostic and therapeutic thoracentesis. EXAM: ULTRASOUND GUIDED DIAGNOSTIC AND THERAPEUTIC THORACENTESIS MEDICATIONS: 1% lidocaine COMPLICATIONS: None immediate. PROCEDURE: An ultrasound guided thoracentesis was thoroughly discussed with the patient's son and questions answered. The benefits, risks, alternatives and complications were also discussed.  The patient's son understands and wishes to proceed with the procedure. Telephone consent was obtained. Ultrasound was performed to localize and mark an adequate pocket of fluid in the right chest. The area was then prepped and draped in the normal sterile fashion. 1% Lidocaine was used for local anesthesia. Under ultrasound guidance a Safe-T-Centesis catheter was introduced. Thoracentesis was performed. The catheter was removed and a dressing applied. FINDINGS: A total of approximately 0.6 L of serous fluid was removed. Samples were sent to the laboratory as requested by the clinical team. IMPRESSION: Successful ultrasound guided right thoracentesis yielding 0.6 L of pleural fluid. Read by: Barnetta Chapel, PA-C Electronically Signed   By: Corlis Leak M.D.   On: 03/14/2017 16:20   Scheduled Meds: . atorvastatin  10 mg  Oral q1800  . cyanocobalamin  500 mcg Oral Daily  . furosemide  40 mg Intravenous Daily  . metoprolol tartrate  2.5 mg Intravenous Q6H  . sodium chloride flush  3 mL Intravenous Q12H  . Warfarin - Pharmacist Dosing Inpatient   Does not apply q1800   Continuous Infusions: . sodium chloride    . ceFEPime (MAXIPIME) IV Stopped (03/14/17 1804)   Pamella Pert, MD, PhD [facility] Pager [phone]  If 7PM-7AM, please contact night-coverage [email or web address] Password [email or web address] 03/15/2017, 11:51 AM

## 2017-03-16 DIAGNOSIS — N390 Urinary tract infection, site not specified: Secondary | ICD-10-CM

## 2017-03-16 DIAGNOSIS — N183 Chronic kidney disease, stage 3 (moderate): Secondary | ICD-10-CM

## 2017-03-16 DIAGNOSIS — I5031 Acute diastolic (congestive) heart failure: Secondary | ICD-10-CM

## 2017-03-16 DIAGNOSIS — G9340 Encephalopathy, unspecified: Secondary | ICD-10-CM

## 2017-03-16 LAB — BASIC METABOLIC PANEL
Anion gap: 7 (ref 5–15)
BUN: 43 mg/dL — AB (ref 6–20)
CHLORIDE: 104 mmol/L (ref 101–111)
CO2: 40 mmol/L — ABNORMAL HIGH (ref 22–32)
Calcium: 8.1 mg/dL — ABNORMAL LOW (ref 8.9–10.3)
Creatinine, Ser: 1.36 mg/dL — ABNORMAL HIGH (ref 0.44–1.00)
GFR calc Af Amer: 38 mL/min — ABNORMAL LOW (ref >60)
GFR calc non Af Amer: 32 mL/min — ABNORMAL LOW (ref >60)
Glucose, Bld: 92 mg/dL (ref 65–99)
POTASSIUM: 3.6 mmol/L (ref 3.5–5.1)
SODIUM: 151 mmol/L — AB (ref 135–145)

## 2017-03-16 LAB — CBC
HEMATOCRIT: 34.8 % — AB (ref 36.0–46.0)
HEMOGLOBIN: 10 g/dL — AB (ref 12.0–15.0)
MCH: 27.2 pg (ref 26.0–34.0)
MCHC: 28.7 g/dL — ABNORMAL LOW (ref 30.0–36.0)
MCV: 94.6 fL (ref 78.0–100.0)
Platelets: 237 10*3/uL (ref 150–400)
RBC: 3.68 MIL/uL — AB (ref 3.87–5.11)
RDW: 18.9 % — ABNORMAL HIGH (ref 11.5–15.5)
WBC: 7.4 10*3/uL (ref 4.0–10.5)

## 2017-03-16 LAB — PROTIME-INR
INR: 5.89 — AB
PROTHROMBIN TIME: 54.5 s — AB (ref 11.4–15.2)

## 2017-03-16 MED ORDER — ENSURE ENLIVE PO LIQD
237.0000 mL | ORAL | Status: DC
Start: 2017-03-16 — End: 2017-03-21
  Administered 2017-03-16 – 2017-03-19 (×4): 237 mL via ORAL

## 2017-03-16 NOTE — Evaluation (Addendum)
Physical Therapy Evaluation Patient Details Name: Emily Elliott MRN: [id number] DOB: Jul 23, 1921 Today's Date: 03/16/2017   History of Present Illness  "83 year old female with a history of essential hypertension, chronic atrial fibrillation, colon cancer status post hemicolectomy, CKD stage III presenting with one-week history of increasing generalized weakness and lethargy. At baseline, the patient has cognitive impairment and requires assistance with her ADLs including bathing and dressing. However, the patient is able to feed herself and ambulate with a walker. She does require assistance getting out of bed and with transfers at baseline"  Pt admitted for UTI  Clinical Impression  Pt admitted with above diagnosis. Pt currently with functional limitations due to the deficits listed below (see PT Problem List).  Pt will benefit from skilled PT to increase their independence and safety with mobility to allow discharge to the venue listed below.  Pt assisted OOB to recliner.  Pt requires increased time however appears to have good safety awareness stating she does not want to fall.  Family not present at time of evaluation however per chart, pt has assist at home for mobility and ADLs.  If family feels unable to provide current assist level, then pt may benefit from SNF upon d/c.     Follow Up Recommendations Supervision/Assistance - 24 hour;SNF    Equipment Recommendations  None recommended by PT    Recommendations for Other Services       Precautions / Restrictions Precautions Precautions: Fall Precaution Comments: L LQ ostomy      Mobility  Bed Mobility Overal bed mobility: Needs Assistance Bed Mobility: Supine to Sit     Supine to sit: Min assist;HOB elevated     General bed mobility comments: increased time, assist to scoot to EOB  Transfers Overall transfer level: Needs assistance Equipment used: Rolling walker (2 wheeled) Transfers: Sit to/from [employer] Sit to Stand: Min assist Stand pivot transfers: Min assist       General transfer comment: attempted using RW however pt not sequencing well, requires increased time and multimodal cues for safe technique, assisted to recliner as lunch arrived, no further mobility at this time due to elevated INR  Ambulation/Gait                Stairs            Wheelchair Mobility    Modified Rankin (Stroke Patients Only)       Balance Overall balance assessment: Needs assistance         Standing balance support: Bilateral upper extremity supported Standing balance-Leahy Scale: Poor                               Pertinent Vitals/Pain Pain Assessment: No/denies pain    Home Living Family/patient expects to be discharged to:: Private residence Living Arrangements: Children   Type of Home: House       Home Layout: One level Home Equipment: Environmental consultant - 2 wheels      Prior Function Level of Independence: Needs assistance   Gait / Transfers Assistance Needed: per chart, pt able to ambulate with RW, usually with at least supervision  ADL's / Homemaking Assistance Needed: assist required  Comments: pt poor historian      Hand Dominance        Extremity/Trunk Assessment   Upper Extremity Assessment Upper Extremity Assessment: Generalized weakness    Lower Extremity Assessment Lower Extremity Assessment: Generalized weakness  Communication   Communication: HOH  Cognition Arousal/Alertness: Awake/alert Behavior During Therapy: WFL for tasks assessed/performed Overall Cognitive Status: History of cognitive impairments - at baseline                                 General Comments: pt seems at baseline however no family present, pt aware she is in hospital although she states she doesn't know which one; when asked the date, she looks to calendar in room      General Comments      Exercises     Assessment/Plan     PT Assessment Patient needs continued PT services  PT Problem List Decreased strength;Decreased mobility;Decreased activity tolerance;Decreased balance;Decreased cognition       PT Treatment Interventions DME instruction;Gait training;Therapeutic exercise;Therapeutic activities;Patient/family education;Functional mobility training    PT Goals (Current goals can be found in the Care Plan section)  Acute Rehab PT Goals Patient Stated Goal: "I was walking before I came in here" PT Goal Formulation: With patient Time For Goal Achievement: 03/30/17 Potential to Achieve Goals: Good    Frequency Min 3X/week   Barriers to discharge        Co-evaluation               AM-PAC PT "6 Clicks" Daily Activity  Outcome Measure Difficulty turning over in bed (including adjusting bedclothes, sheets and blankets)?: A Lot Difficulty moving from lying on back to sitting on the side of the bed? : Total Difficulty sitting down on and standing up from a chair with arms (e.g., wheelchair, bedside commode, etc,.)?: Total Help needed moving to and from a bed to chair (including a wheelchair)?: A Lot Help needed walking in hospital room?: Total Help needed climbing 3-5 steps with a railing? : Total 6 Click Score: 8    End of Session Equipment Utilized During Treatment: Gait belt Activity Tolerance: Patient tolerated treatment well Patient left: with call bell/phone within reach;in chair Nurse Communication: Mobility status (aware pt up in recliner) PT Visit Diagnosis: Difficulty in walking, not elsewhere classified (R26.2)    Time: [id number] PT Time Calculation (min) (ACUTE ONLY): 21 min   Charges:   PT Evaluation $PT Eval Low Complexity: 1 Low     PT G CodesZenovia Jarred:        Kati Abdon Petrosky, PT, DPT 03/16/2017 Pager: [phone]  Maida SaleLEMYRE,KATHrine E 03/16/2017, 12:46 PM

## 2017-03-16 NOTE — Progress Notes (Signed)
PROGRESS NOTE  Emily Elliott ZOX:[id number] DOB: 1921-04-23 DOA: 03/13/2017 PCP: Patient, No Pcp Per   LOS: 3 days   Brief Narrative / Interim history: 83 year old female with a history of essential hypertension, chronic atrial fibrillation, colon cancer status post hemicolectomy, CKD stage III presenting with one-week history of increasing generalized weakness and lethargy. At baseline, the patient has cognitive impairment and requires assistance with her ADLs including bathing and dressing. However, the patient is able to feed herself and ambulate with a walker. She does require assistance getting out of bed and with transfers at baseline. In the past week, the family has noted increasing generalized weakness with increasing difficulty getting out of bed and bearing any weight. In addition, the patient has had decreased oral intake. The patient's family is also noted increasing "swelling" in her abdomen. The patient was placed on Cipro for presumptive UTI possibly one week prior to this admission. Because of persistent lethargy, she was seen by her primary care provider on 03/13/2017. Reportedly, urinalysis showed bacteriuria and nitrites. Because of concern for persistent UTI and continued lethargy, the patient strictly to the emergency department. In the emergency department, the patient was noted to be hypoxic with oxygen saturation 84% on room air. VBG showed hypercarbia and hypoxia.  She was subsequently placed on BiPAP and stabilized. Chest x-ray showed a right pleural effusion with RLL atelectasis/infiltrate. CT of the abdomen and pelvis showed a large right pleural effusion with adjacent atelectasis. There was a LLQ ostomy, but no other acute findings on CT of the abdomen. Ceftriaxone and azithromycin were given in the emergency department.  Assessment & Plan: Principal Problem:   UTI (urinary tract infection) Active Problems:   Atrial fibrillation, chronic (HCC)   Essential hypertension  Hypernatremia   AKI (acute kidney injury) (HCC)   Acute encephalopathy   History of colon cancer   Pleural effusion, right   Dysphagia   CKD (chronic kidney disease), stage III   Acute respiratory failure with hypoxia and hypercapnia (HCC)   Acute CHF (congestive heart failure) (HCC)   Coagulopathy (HCC)   Acute metabolic encephalopathy -this is believed to be multifactorial due to UTI/decompensated diastolic CHF in the setting of hypoxic respiratory failure due to pleural effusion.  TSH was checked and it was normal.  B12 was checked and was elevated.  Ammonia was within normal limits.  Patient's mental status seems to be improving following the thoracentesis, and she has become more alert and appears close to baseline.  Acute respiratory failure with hypoxia and hypercarbia -due to decompensated dCHF / pleural effusion, she underwent 0.6 L thoracentesis on 8/7.  Her respiratory status is improved, she is currently on nasal cannula and has not been requiring BiPAP for the past 2 days.  She underwent an echocardiogram on 8/8 (full results below) -will transfer to the floor today.    Right pleural effusion -Likely due to CHF, cytology to evaluate for malignant effusion given her history of colon cancer is pending.  Acute CHF, likely diastolic -She is clinically volume overloaded, started on furosemide, continue.  2D echo done shows ejection fraction of 65-70%, severe concentric hypertrophy, decreased RV systolic function and biatrial dilation.  PA peak pressure was 61.  Cardiology consulted, appreciate Dr. Jacinto Halim input.  He recommends addition of AV nodal blocking agents, she is already on diltiazem at home and have added metoprolol.  Blood pressure and heart rate are well controlled, continue current regimen -We will continue IV Lasix for today, reevaluate fluid status in the  morning and see when can she be transitioned to p.o.  Hypernatremia -Likely due to mild intravascular depletion, closely  monitor while on diuretics  UTI -UA shows TNTC WBC, continue cefepime, cultures without significant growth, would favor to treat 3-5 days, today day #4  CKD 3 -Baseline creatinine 1.1-1.3, Presenting creatinine 1.59, creatinine stable 1.49 on 8/7 and 1.47 on 8/8, 1.36 on 8/9 -Monitor with diuresis  Chronic atrial fibrillation -CHADSVASc = 7 -continue warfarin--currently INR supratherapeutic but improving today, no bleeding, closely monitor -rate controlled -Continue metoprolol and Cardizem, blood pressure is allowing that  Dysphagia -Previously evaluated by speech therapy 09/26/16-->dysphagia 3 with thin -repeat eval when more alert  History of colon cancer status post colostomy -Had colon resection in 2014 -Consult wound care nurse for ostomy care -No history of chemoradiation therapy.  Cytology in the pleural fluid is pending  Essential hypertension -BP initially soft upon presentation -Continue diltiazem and metoprolol succinate as BP allows  Coagulopathy  -INR is supratherapeutic -Likely due to recent ciprofloxacin use and poor po intake  Elevated troponin -likely demand ischemia in setting of UTI and decompensated CHF   DVT prophylaxis: Coumadin Code Status: Full code Family Communication: d/w son over the phone (he is an MD) Disposition Plan: Transfer to telemetry  Consultants:  Cardiology   Procedures:   2D echo Study Conclusions - Left ventricle: The cavity size was normal. There was severe concentric hypertrophy. Systolic function was vigorous. The estimated ejection fraction was in the range of 65% to 70%. Wall motion was normal; there were no regional wall motion abnormalities. The study is not technically sufficient to allow evaluation of LV diastolic function. - Mitral valve: Mildly thickened leaflets . There was trivial regurgitation. - Left atrium: Mildly dilated. - Right ventricle: The cavity size was mildly dilated. Low normal systolic  function. - Right atrium: Moderately dilated. - Tricuspid valve: There was moderate regurgitation. - Pulmonic valve: There was mild regurgitation. - Pulmonary arteries: PA peak pressure: 61 mm Hg (S). - Inferior vena cava: The vessel was dilated. The respirophasic diameter changes were blunted (< 50%), consistent with elevated central venous pressure. - Pericardium, extracardiac: There was no pericardial effusion. There was a left pleural effusion.  Impressions: - LVEF 65-70%, severe LVH, normal wall motion, mild LAE, moderate RAE, mild PR, moderate TR, RVSP 61 mmHg, dilated IVC, left pleural effusion.  Antimicrobials:  Cefepime 8/6 >>   Subjective: -She is feeling well today, no chest pain, no shortness of breath.  She denies any abdominal pain, nausea vomiting or diarrhea.  She recalls her son visiting last night.  Objective: Vitals:   03/16/17 0800 03/16/17 0917 03/16/17 0918 03/16/17 1000  BP: (!) 167/93  (!) 156/90 (!) 144/76  Pulse: 90 99  (!) 53  Resp: 16 20 (!) 21 (!) 23  Temp: 97.8 F (36.6 C)     TempSrc: Oral     SpO2: 100% 98%  90%  Weight:      Height:        Intake/Output Summary (Last 24 hours) at 03/16/17 1106 Last data filed at 03/16/17 0500  Gross per 24 hour  Intake              260 ml  Output             1700 ml  Net            -1440 ml   Filed Weights   03/13/17 1644 03/14/17 0000 03/14/17 0700  Weight: 63.5 kg (140  lb) 72.4 kg (159 lb 9.8 oz) 72.3 kg (159 lb 6.3 oz)    Examination:  Vitals:   03/16/17 0800 03/16/17 0917 03/16/17 0918 03/16/17 1000  BP: (!) 167/93  (!) 156/90 (!) 144/76  Pulse: 90 99  (!) 53  Resp: 16 20 (!) 21 (!) 23  Temp: 97.8 F (36.6 C)     TempSrc: Oral     SpO2: 100% 98%  90%  Weight:      Height:        Constitutional: NAD Eyes: PERRL, lids and conjunctivae normal ENMT: Mucous membranes are moist.  Neck: normal, supple Respiratory: clear to auscultation bilaterally, no wheezing, no crackles. Normal  respiratory effort.  Cardiovascular: Irregular, no murmurs 1+ LE edema. 2+ pedal pulses.  Abdomen: no tenderness. Bowel sounds positive.  Neurologic: non focal  Data Reviewed: I have independently reviewed following labs and imaging studies   CBC:  Recent Labs Lab 03/13/17 1835 03/14/17 0325 03/16/17 0303  WBC 7.3 7.2 7.4  NEUTROABS 6.0 5.4  --   HGB 10.9* 11.4* 10.0*  HCT 37.1 38.5 34.8*  MCV 95.6 96.7 94.6  PLT 280 246 237   Basic Metabolic Panel:  Recent Labs Lab 03/13/17 1835 03/14/17 0325 03/14/17 1010 03/15/17 0311 03/16/17 0303  NA 151* 150*  --  152* 151*  K 3.6 3.9  --  3.6 3.6  CL 110 112*  --  108 104  CO2 34* 32  --  37* 40*  GLUCOSE 115* 94  --  80 92  BUN 47* 43*  --  40* 43*  CREATININE 1.59* 1.49*  --  1.47* 1.36*  CALCIUM 8.2* 7.8*  --  8.1* 8.1*  MG  --   --  2.9* 2.7*  --    GFR: Estimated Creatinine Clearance: 24.6 mL/min (A) (by C-G formula based on SCr of 1.36 mg/dL (H)). Liver Function Tests:  Recent Labs Lab 03/13/17 1835  AST 23  ALT 11*  ALKPHOS 51  BILITOT 0.9  PROT 7.1  ALBUMIN 3.4*   No results for input(s): LIPASE, AMYLASE in the last 168 hours.  Recent Labs Lab 03/14/17 1010  AMMONIA 32   Coagulation Profile:  Recent Labs Lab 03/13/17 2058 03/14/17 0325 03/15/17 0311 03/16/17 0303  INR 6.20* 5.77* 6.37* 5.89*   Cardiac Enzymes: No results for input(s): CKTOTAL, CKMB, CKMBINDEX, TROPONINI in the last 168 hours. BNP (last 3 results) No results for input(s): PROBNP in the last 8760 hours. HbA1C: No results for input(s): HGBA1C in the last 72 hours. CBG: No results for input(s): GLUCAP in the last 168 hours. Lipid Profile: No results for input(s): CHOL, HDL, LDLCALC, TRIG, CHOLHDL, LDLDIRECT in the last 72 hours. Thyroid Function Tests:  Recent Labs  03/14/17 1010  TSH 2.077   Anemia Panel:  Recent Labs  03/14/17 1010  VITAMINB12 >7,500*   Urine analysis:    Component Value Date/Time    COLORURINE YELLOW 03/13/2017 2241   APPEARANCEUR HAZY (A) 03/13/2017 2241   LABSPEC 1.016 03/13/2017 2241   PHURINE 6.0 03/13/2017 2241   GLUCOSEU NEGATIVE 03/13/2017 2241   HGBUR NEGATIVE 03/13/2017 2241   BILIRUBINUR NEGATIVE 03/13/2017 2241   KETONESUR NEGATIVE 03/13/2017 2241   PROTEINUR 100 (A) 03/13/2017 2241   NITRITE NEGATIVE 03/13/2017 2241   LEUKOCYTESUR LARGE (A) 03/13/2017 2241   Sepsis Labs: Invalid input(s): PROCALCITONIN, LACTICIDVEN  Recent Results (from the past 240 hour(s))  Blood culture (routine x 2)     Status: None (Preliminary result)   Collection  Time: 03/13/17  6:40 PM  Result Value Ref Range Status   Specimen Description BLOOD LEFT FOREARM  Final   Special Requests   Final    BOTTLES DRAWN AEROBIC AND ANAEROBIC Blood Culture adequate volume   Culture   Final    NO GROWTH 2 DAYS Performed at [facility], [address] [place], [place] [address]    Report Status PENDING  Incomplete  Blood culture (routine x 2)     Status: None (Preliminary result)   Collection Time: 03/13/17  8:58 PM  Result Value Ref Range Status   Specimen Description BLOOD LEFT HAND  Final   Special Requests IN PEDIATRIC BOTTLE Blood Culture adequate volume  Final   Culture   Final    NO GROWTH 1 DAY Performed at [facility], [address] [place], [place] [address]    Report Status PENDING  Incomplete  Urine culture     Status: Abnormal   Collection Time: 03/13/17 10:41 PM  Result Value Ref Range Status   Specimen Description URINE, CLEAN CATCH  Final   Special Requests Normal  Final   Culture (A)  Final    <10,000 COLONIES/mL INSIGNIFICANT GROWTH Performed at [facility], [address] [place], [place] [address]    Report Status 03/15/2017 FINAL  Final  MRSA PCR Screening     Status: None   Collection Time: 03/13/17 11:44 PM  Result Value Ref Range Status   MRSA by PCR NEGATIVE NEGATIVE Final    Comment:        The GeneXpert MRSA Assay  (FDA approved for NASAL specimens only), is one component of a comprehensive MRSA colonization surveillance program. It is not intended to diagnose MRSA infection nor to guide or monitor treatment for MRSA infections.   Gram stain     Status: None   Collection Time: 03/14/17  4:04 PM  Result Value Ref Range Status   Specimen Description PLEURAL RIGHT  Final   Special Requests BOTTLES DRAWN AEROBIC AND ANAEROBIC  Final   Gram Stain   Final    WBC PRESENT, PREDOMINANTLY PMN NO ORGANISMS SEEN CYTOSPIN SMEAR Performed at [facility], [address], [place], [place] [address]    Report Status 03/14/2017 FINAL  Final  Culture, body fluid-bottle     Status: None (Preliminary result)   Collection Time: 03/14/17  4:04 PM  Result Value Ref Range Status   Specimen Description PLEURAL RIGHT  Final   Special Requests BOTTLES DRAWN AEROBIC AND ANAEROBIC  Final   Culture   Final    NO GROWTH < 24 HOURS Performed at [facility], [address] [place], [place] [address]    Report Status PENDING  Incomplete      Radiology Studies: Dg Chest 1 View  Result Date: 03/14/2017 CLINICAL DATA:  Status post thoracentesis EXAM: CHEST 1 VIEW COMPARISON:  Chest radiograph 03/13/2017 FINDINGS: Status post right thoracentesis with decreased amount of pleural fluid. Right basilar opacities may indicate a degree of re-expansion pulmonary edema. No pneumothorax. Unchanged small left pleural effusion and associated atelectasis. IMPRESSION: Decreased size of right pleural effusion without post thoracentesis pneumothorax. Electronically Signed   By: Deatra Robinson M.D.   On: 03/14/2017 16:20   Dg Swallowing Func-speech Pathology  Result Date: 03/15/2017 Objective Swallowing Evaluation: Type of Study: MBS-Modified Barium Swallow Study Patient Details Name: Angelize Ryce MRN: [id number] Date of Birth: [birth year] Today's Date: 03/15/2017 Time: SLP Start Time (ACUTE  ONLY): 0900-SLP Stop Time (ACUTE ONLY):  0919 SLP Time Calculation (min) (ACUTE ONLY): 19 min Past Medical History: Past Medical History: Diagnosis Date . Atrial fibrillation (HCC)  . Coronary artery disease  . Hypertension  Past Surgical History: Past Surgical History: Procedure Laterality Date . COLOSTOMY   HPI: 83 yo female adm [facility] with weakness, confusion, somnolence - pt diagnosed with Urinary Tract Infection.  PMH + for old lacunar CVAs - bilateral basal ganglia, thalamic CVA.  Per daughter Colvin Caroli  - pt has been coughing with intake at home and reports issues with "feeling full" pointing to proximal esophagus.  Pt denies this being a significant issue.  She has resided with daughter for the last 18 months.  Pt with ? ascities per imaging study.  CXR showed large right pleural effusion - diagnosed with respiratory acidosis - for thoracentesis today.   Subjective: pt awake in bed, desires liquid intake Assessment / Plan / Recommendation CHL IP CLINICAL IMPRESSIONS 03/15/2017 Clinical Impression Pt with mild oropharyngeal dysphagia with delayed oral transiting/coordination due to lingual discoordination.  Pharyngeal swallow is timely without aspiration/penetration nor significant residuals.  Minimal pharyngeal residuals presents which pt does not consistently sense but CUED dry swallows helpful to decrease.  Pt does take large boluses - most notably liquids - suspect more of self feeding deficit.  Recommend dys1/thin with strict precautions. Using live video, educated pt and daughter to findings/recommendations.  Spoke to MD and obtained diet order and posted signs in pt's room.  Of note, pt does become fatigued easily and may benefit from nutritional supplements.  SLP Visit Diagnosis Dysphagia, oropharyngeal phase (R13.12);Dysphagia, pharyngoesophageal phase (R13.14) Attention and concentration deficit following -- Frontal lobe and executive function deficit following -- Impact on safety and function Mild aspiration risk;Risk for inadequate  nutrition/hydration   CHL IP TREATMENT RECOMMENDATION 03/15/2017 Treatment Recommendations F/U MBS in --- days (Comment)   Prognosis 03/15/2017 Prognosis for Safe Diet Advancement Fair Barriers to Reach Goals -- Barriers/Prognosis Comment -- CHL IP DIET RECOMMENDATION 03/15/2017 SLP Diet Recommendations Dysphagia 1 (Puree) solids;Thin liquid Liquid Administration via Cup;Straw Medication Administration Whole meds with puree Compensations Slow rate;Small sips/bites Postural Changes --   CHL IP OTHER RECOMMENDATIONS 03/15/2017 Recommended Consults -- Oral Care Recommendations -- Other Recommendations Order thickener from pharmacy   CHL IP FOLLOW UP RECOMMENDATIONS 03/15/2017 Follow up Recommendations (No Data)   CHL IP FREQUENCY AND DURATION 03/15/2017 Speech Therapy Frequency (ACUTE ONLY) min 2x/week Treatment Duration 1 week      CHL IP ORAL PHASE 03/15/2017 Oral Phase Impaired Oral - Pudding Teaspoon -- Oral - Pudding Cup -- Oral - Honey Teaspoon -- Oral - Honey Cup -- Oral - Nectar Teaspoon -- Oral - Nectar Cup Premature spillage;Weak lingual manipulation Oral - Nectar Straw -- Oral - Thin Teaspoon Weak lingual manipulation;Premature spillage Oral - Thin Cup Weak lingual manipulation;Premature spillage Oral - Thin Straw Premature spillage;Weak lingual manipulation Oral - Puree Premature spillage;Weak lingual manipulation;Decreased bolus cohesion Oral - Mech Soft Weak lingual manipulation;Premature spillage;Decreased bolus cohesion Oral - Regular -- Oral - Multi-Consistency -- Oral - Pill Weak lingual manipulation;Premature spillage Oral Phase - Comment --  CHL IP PHARYNGEAL PHASE 03/15/2017 Pharyngeal Phase Impaired Pharyngeal- Pudding Teaspoon -- Pharyngeal -- Pharyngeal- Pudding Cup -- Pharyngeal -- Pharyngeal- Honey Teaspoon -- Pharyngeal -- Pharyngeal- Honey Cup -- Pharyngeal -- Pharyngeal- Nectar Teaspoon WFL Pharyngeal -- Pharyngeal- Nectar Cup Pharyngeal residue - valleculae Pharyngeal -- Pharyngeal- Nectar Straw --  Pharyngeal -- Pharyngeal- Thin Teaspoon WFL Pharyngeal -- Pharyngeal- Thin  Cup [facility];Pharyngeal residue - valleculae Pharyngeal -- Pharyngeal- Thin Straw WFL;Pharyngeal residue - valleculae Pharyngeal -- Pharyngeal- Puree Delayed swallow initiation-vallecula Pharyngeal -- Pharyngeal- Mechanical Soft Delayed swallow initiation-vallecula Pharyngeal -- Pharyngeal- Regular -- Pharyngeal -- Pharyngeal- Multi-consistency -- Pharyngeal -- Pharyngeal- Pill WFL Pharyngeal -- Pharyngeal Comment --  CHL IP CERVICAL ESOPHAGEAL PHASE 03/15/2017 Cervical Esophageal Phase Impaired Pudding Teaspoon -- Pudding Cup -- Honey Teaspoon -- Honey Cup -- Nectar Teaspoon -- Nectar Cup -- Nectar Straw -- Thin Teaspoon -- Thin Cup -- Thin Straw -- Puree -- Mechanical Soft -- Regular -- Multi-consistency -- Pill -- Cervical Esophageal Comment appearance of adequate clearance of barium = radiologist not present to confirm No flowsheet data found. [name], Tamara [name] 03/15/2017, 11:33 AM  Donavan Burnetamara Kimball, MS [facility] SLP [phone]             [place] Thoracentesis Asp Pleural Space W/img Guide  Result Date: 03/14/2017 INDICATION: Decompensated heart failure with a right-sided pleural effusion. Request is made for diagnostic and therapeutic thoracentesis. EXAM: ULTRASOUND GUIDED DIAGNOSTIC AND THERAPEUTIC THORACENTESIS MEDICATIONS: 1% lidocaine COMPLICATIONS: None immediate. PROCEDURE: An ultrasound guided thoracentesis was thoroughly discussed with the patient's son and questions answered. The benefits, risks, alternatives and complications were also discussed. The patient's son understands and wishes to proceed with the procedure. Telephone consent was obtained. Ultrasound was performed to localize and mark an adequate pocket of fluid in the right chest. The area was then prepped and draped in the normal sterile fashion. 1% Lidocaine was used for local anesthesia. Under ultrasound guidance a Safe-T-Centesis catheter was introduced. Thoracentesis was  performed. The catheter was removed and a dressing applied. FINDINGS: A total of approximately 0.6 L of serous fluid was removed. Samples were sent to the laboratory as requested by the clinical team. IMPRESSION: Successful ultrasound guided right thoracentesis yielding 0.6 L of pleural fluid. Read by: Barnetta ChapelKelly Osborne, PA-C Electronically Signed   By: Corlis Leak  Hassell M.D.   On: 03/14/2017 16:20   Scheduled Meds: . atorvastatin  10 mg Oral q1800  . cyanocobalamin  500 mcg Oral Daily  . diltiazem  120 mg Oral Daily  . furosemide  40 mg Intravenous Daily  . metoprolol succinate  50 mg Oral BID  . sodium chloride flush  3 mL Intravenous Q12H  . Warfarin - Pharmacist Dosing Inpatient   Does not apply q1800   Continuous Infusions: . sodium chloride    . ceFEPime (MAXIPIME) IV Stopped (03/15/17 1744)   Pamella Pertostin Gherghe, MD, PhD [facility] Pager [phone]  If 7PM-7AM, please contact night-coverage [email or web address] Password [email or web address] 03/16/2017, 11:06 AM

## 2017-03-16 NOTE — Progress Notes (Signed)
ANTICOAGULATION CONSULT NOTE   Pharmacy Consult for warfarin Indication: hx atrial fibrillation  Allergies  Allergen Reactions  . Benadryl [Diphenhydramine]     rash    Patient Measurements: Height: 5\' 3"  (160 cm) Weight: 159 lb 6.3 oz (72.3 kg) IBW/kg (Calculated) : 52.4   Vital Signs: Temp: 98 F (36.7 C) (08/09 0305) Temp Source: Axillary (08/09 0305) BP: 145/88 (08/09 0400) Pulse Rate: 85 (08/09 0400)  Labs:  Recent Labs  03/13/17 1835  03/14/17 0325 03/15/17 0311 03/16/17 0303  HGB 10.9*  --  11.4*  --  10.0*  HCT 37.1  --  38.5  --  34.8*  PLT 280  --  246  --  237  LABPROT  --   < > 53.6* 58.0* 54.5*  INR  --   < > 5.77* 6.37* 5.89*  CREATININE 1.59*  --  1.49* 1.47* 1.36*  < > = values in this interval not displayed.  Estimated Creatinine Clearance: 24.6 mL/min (A) (by C-G formula based on SCr of 1.36 mg/dL (H)).   Medical History: Past Medical History:  Diagnosis Date  . Atrial fibrillation (HCC)   . Hypertension     Medications:  Home warfarin regimen:1.5 mg daily except 2 mg on Tuesdays  Assessment: Patient's a 83 y.o F with hx afib on warfarin PTA, presented to the ED on 03/13/17 with c/o dysuria.  To resume warfarin while patient is hospitalized.  Of note, patient was on cipro PTA and was instructed by her PCP to hold warfarin (d/t elevated INR secondary to drug-drug intxns) until she has completed her course of cipro (last warfarin dose taken on 8/2, scheduled to resume warfarin back on 03/14/17).   Today, 03/16/2017: - INR remains elevated at 5.89 (slightly improved from yesterday) - hgb 10.0, plt within normal limits - no bleeding reported - s/p thoracentesis 8/8  Goal of Therapy:  INR 2-3 Monitor platelets by anticoagulation protocol: Yes   Plan:  - continue to hold warfarin for now.  Will f/u with INR and resume when appropriate. - daily INR - monitor for s/s of bleeding  Loralee PacasErin Shikara Mcauliffe, PharmD, BCPS Pager: [phone] 03/16/2017 7:59  AM

## 2017-03-16 NOTE — Progress Notes (Signed)
  Speech Language Pathology Treatment: Dysphagia  Patient Details Name: Emily Elliott MRN: [id number] DOB: 12-19-1920 Today's Date: 03/16/2017 Time: [id number] SLP Time Calculation (min) (ACUTE ONLY): 24 min  Assessment / Plan / Recommendation Clinical Impression  Pt sitting upright feeding herself lunch.  She does appear fatigued and SLP assisted her to continue to eat her meal.  Note swallow precaution signs that were posted in her room (1234) were not transferred to her new room (1232).  Advised head RN and pt RN today to this occurrence and left multiple swallow precaution signs in room for pt.  Pt today is consistently coughing immediately post-swallow of thin - concerning for aspiration.  Delayed multiple swallows also observed.  No indications of difficulty with puree/nectar - therefore recommend to downgrade diet for now with strict precautions.  Yesterday during MBS, she did NOT cough and did not aspirate.  She admits to continued dyspnea and fatigue today - though breathing is reported to be better than yesterday  *she is s/p thoracentesis.    Will follow up for family education/dietary advancement.   HPI HPI: 83 [facility], confusion, somnolence - pt diagnosed with Urinary Tract Infection.  PMH + for old lacunar CVAs - bilateral basal ganglia, thalamic CVA.  Per daughter Emily Elliott  - pt has been coughing with intake at home and reports issues with "feeling full" pointing to proximal esophagus.  Pt denies this being a significant issue.  She has resided with daughter for the last 18 months.  Pt with ? ascities per imaging study.  CXR showed large right pleural effusion - diagnosed with respiratory acidosis - for thoracentesis today.        SLP Plan  Continue with current plan of care       Recommendations  Diet recommendations: Dysphagia 1 (puree);Nectar-thick liquid Liquids provided via: Cup;Straw Medication Administration: Whole meds with puree Supervision:  Full supervision/cueing for compensatory strategies Compensations: Slow rate;Small sips/bites Postural Changes and/or Swallow Maneuvers: Seated upright 90 degrees;Upright 30-60 min after meal                Oral Care Recommendations: Oral care BID SLP Visit Diagnosis: Dysphagia, oropharyngeal phase (R13.12) Plan: Continue with current plan of care       GO               Emily Burnetamara Kenaz Olafson, MS [facility] SLP [phone]  Emily Elliott, Emily Elliott 03/16/2017, 12:11 PM

## 2017-03-16 NOTE — Progress Notes (Signed)
Nutrition Follow-up  DOCUMENTATION CODES:   Not applicable  INTERVENTION:  - Will order Ensure Enlive once/day, each supplement provides 350 kcal and 20 grams of protein - Continue to encourage PO intakes of meals and supplement. - RD will monitor for additional nutrition-related needs.  NUTRITION DIAGNOSIS:   Swallowing difficulty related to dysphagia as evidenced by  (SLP notes, need for Dysphagia 1 with thin liquids). -ongoing  GOAL:   Patient will meet greater than or equal to 90% of their needs -unmet at this time.   MONITOR:   PO intake, Supplement acceptance, Weight trends, Labs, I & O's  ASSESSMENT:   83 year old female with a history of essential hypertension, chronic atrial fibrillation, colon cancer status post hemicolectomy, CKD stage III presenting with one-week history of increasing generalized weakness and lethargy.   8/9 On 8/7 SLP recommended Dysphagia 1 with nectar-thick liquids. MBS was subsequently done on 8/8 and diet recommendation updated to Dysphagia 1 with thin liquids based on findings. Diet advanced from NPO to Dysphagia 1 with thin liquids yesterday at 0951. Per chart review, pt consumed 50% of lunch and 75% of dinner yesterday. Will order Ensure once/day to supplement as intakes improve and d/t increased kcal and protein needs during hospitalization. No new weight since 8/7.   Medications reviewed; 40 mg IV Lasix/day. Labs reviewed; Na: 151 mmol/L, BUN: 43 mg/dL, creatinine: 1.611.36 mg/dL, Ca: 8.1 mg/dL, GFR: 32 mL/min.     8/7 - Unable to speak with pt today as pt was sleeping at time of RD visit and on Bipap. - Pt is currently NPO.  - Per MD note, pt previously evaluated by speech therapy 09/26/16 and recommended for DYS 3/thin liquid diet.  - Per chart, pt with wt gain likely secondary to edema and pleural effusion.  - RD will order appropriate supplements when diet advanced.    Nutrition-Focused physical exam completed. Findings are moderate fat  depletions in arms, mild to moderate muscle depletions in clavicles, and moderate edema in BLE.     Diet Order:  DIET - DYS 1 Room service appropriate? Yes; Fluid consistency: Thin  Skin:  Reviewed, no issues  Last BM:  8/8  Height:   Ht Readings from Last 1 Encounters:  03/14/17 5\' 3"  (1.6 m)    Weight:   Wt Readings from Last 1 Encounters:  03/14/17 159 lb 6.3 oz (72.3 kg)    Ideal Body Weight:  52.3 kg  BMI:  Body mass index is 28.24 kg/m.  Estimated Nutritional Needs:   Kcal:  1550-1850  Protein:  75-88 grams  Fluid:  >/= 1.5L/day   EDUCATION NEEDS:   No education needs identified at this time    Trenton GammonJessica Crystallee Werden, MS, RD, LDN, CNSC Inpatient Clinical Dietitian Pager # [phone] After hours/weekend pager # [phone]

## 2017-03-17 ENCOUNTER — Inpatient Hospital Stay (HOSPITAL_COMMUNITY): Payer: Medicare Other

## 2017-03-17 LAB — BASIC METABOLIC PANEL
ANION GAP: 6 (ref 5–15)
BUN: 38 mg/dL — ABNORMAL HIGH (ref 6–20)
CALCIUM: 8.2 mg/dL — AB (ref 8.9–10.3)
CO2: 45 mmol/L — AB (ref 22–32)
Chloride: 100 mmol/L — ABNORMAL LOW (ref 101–111)
Creatinine, Ser: 1.17 mg/dL — ABNORMAL HIGH (ref 0.44–1.00)
GFR, EST AFRICAN AMERICAN: 45 mL/min — AB (ref >60)
GFR, EST NON AFRICAN AMERICAN: 39 mL/min — AB (ref >60)
Glucose, Bld: 103 mg/dL — ABNORMAL HIGH (ref 65–99)
POTASSIUM: 3.4 mmol/L — AB (ref 3.5–5.1)
Sodium: 151 mmol/L — ABNORMAL HIGH (ref 135–145)

## 2017-03-17 LAB — PROTIME-INR
INR: 3.76
PROTHROMBIN TIME: 38.1 s — AB (ref 11.4–15.2)

## 2017-03-17 MED ORDER — POTASSIUM CHLORIDE CRYS ER 20 MEQ PO TBCR
40.0000 meq | EXTENDED_RELEASE_TABLET | Freq: Once | ORAL | Status: AC
  Administered 2017-03-17: 40 meq via ORAL
  Filled 2017-03-17: qty 2

## 2017-03-17 NOTE — NC FL2 (Signed)
[place] MEDICAID FL2 LEVEL OF CARE SCREENING TOOL     IDENTIFICATION  Patient Name: Emily Elliott Birthdate: 03/12/1921 Sex: female Admission Date (Current Location): 03/13/2017  [facility] and [place] Number:  Producer, television/film/video and Address:  [facility],  [address], [place] [address]      Provider Number: [id number]  Attending Physician Name and Address:  Leatha Gilding, MD  Relative Name and Phone Number:       Current Level of Care: Hospital Recommended Level of Care: Nursing Facility (Supervision/24hour recommendation) Prior Approval Number:    Date Approved/Denied:   PASRR Number:   [id number]   Discharge Plan: SNF    Current Diagnoses: Patient Active Problem List   Diagnosis Date Noted  . Acute CHF (congestive heart failure) (HCC) 03/14/2017  . Coagulopathy (HCC) 03/14/2017  . Atrial fibrillation, chronic (HCC) 03/13/2017  . Essential hypertension 03/13/2017  . Hypernatremia 03/13/2017  . AKI (acute kidney injury) (HCC) 03/13/2017  . Acute encephalopathy 03/13/2017  . UTI (urinary tract infection) 03/13/2017  . History of colon cancer 03/13/2017  . Pleural effusion, right 03/13/2017  . Dysphagia 03/13/2017  . CKD (chronic kidney disease), stage III 03/13/2017  . Acute respiratory failure with hypoxia and hypercapnia (HCC)   . Foot pain, bilateral 05/10/2016    Orientation RESPIRATION BLADDER Height & Weight     Self  O2 (1L) Continent, External catheter Weight: 148 lb 13 oz (67.5 kg) Height:  5\' 3"  (160 cm)  BEHAVIORAL SYMPTOMS/MOOD NEUROLOGICAL BOWEL NUTRITION STATUS   Calm and cooperative  Only oriented to person Incontinent (ostomy) Diet (See DC summary)  Diet recommendations: Dysphagia 1 (puree);Nectar-thick liquid Liquids provided via: Cup;Straw Medication Administration: Whole meds with puree Supervision: Full supervision/cueing for compensatory strategies Compensations: Slow rate;Small sips/bites Postural Changes  and/or Swallow Maneuvers: Seated upright 90 degrees;Upright 30-60 min after meal  Will order Ensure Enlive once/day, each supplement provides 350 kcal and 20 grams of protein  AMBULATORY STATUS COMMUNICATION OF NEEDS Skin   Extensive Assist (uses rolling water) Verbally Other (Comment), Skin abrasions, Bruising  Stoma type/location: LLQ colostomy (established). Stomal assessment/size: 1 and 3/8 inches, red, moist, slightly long Peristomal assessment: not seen today.  Skin barrier applied yesterday. Treatment options for stomal/peristomal skin: None indicated. Output: soft brown stool Ostomy pouching: 2pc. 2 and 1/4 inch pouching system by [employer], the same brand that we use here at the hospital.  Patient uses closed end pouch at home.  I have provided 2 skin barriers and two pouches to the bedside and attached a drainable pouch with integrated gas filter to the skin barrier today. I explain to patient that we will empty the pouch while she is with [place] in acute care and that she can return to the use of her closed-end pouches upon discharge.  She indicates she has no difficulties with supplies, and persistent or recurrent skin issues or with managing her ostomy at home.  Staff tell me she is assisted by                        Personal Care Assistance Level of Assistance  Bathing, Feeding, Dressing Bathing Assistance: Maximum assistance Feeding assistance: Limited assistance Dressing Assistance: Maximum assistance     Functional Limitations Info  Sight, Hearing, Speech Sight Info: Impaired Hearing Info: Impaired Speech Info: Adequate    SPECIAL CARE FACTORS FREQUENCY  PT (By licensed PT), OT (By licensed OT)     PT Frequency: 3x a week  OT Frequency: 3x a week    SLP:  3x a week        Contractures Contractures Info: Not present    Additional Factors Info  Code Status, Allergies Code Status Info: Full Code Allergies Info:  Benadryl Diphenhydramine           Current  Medications (03/17/2017):  This is the current hospital active medication list Current Facility-Administered Medications  Medication Dose Route Frequency Provider Last Rate Last Dose  . 0.9 %  sodium chloride infusion  250 mL Intravenous PRN Opyd, Lavone Neriimothy S, MD      . acetaminophen (TYLENOL) tablet 650 mg  650 mg Oral Q6H PRN Opyd, Lavone Neriimothy S, MD       Or  . acetaminophen (TYLENOL) suppository 650 mg  650 mg Rectal Q6H PRN Opyd, Lavone Neriimothy S, MD      . atorvastatin (LIPITOR) tablet 10 mg  10 mg Oral q1800 Opyd, Lavone Neriimothy S, MD   10 mg at 03/16/17 1859  . ceFEPIme (MAXIPIME) 1 g in dextrose 5 % 50 mL IVPB  1 g Intravenous Q24H Opyd, Lavone Neriimothy S, MD   Stopped at 03/16/17 1929  . cyanocobalamin tablet 500 mcg  500 mcg Oral Daily Opyd, Lavone Neriimothy S, MD   500 mcg at 03/17/17 1049  . diltiazem (CARDIZEM CD) 24 hr capsule 120 mg  120 mg Oral Daily Yates DecampGanji, Jay, MD   120 mg at 03/16/17 0921  . feeding supplement (ENSURE ENLIVE) (ENSURE ENLIVE) liquid 237 mL  237 mL Oral Q24H Leatha GildingGherghe, Costin M, MD   237 mL at 03/16/17 1233  . furosemide (LASIX) injection 40 mg  40 mg Intravenous Daily Tat, Onalee Huaavid, MD   40 mg at 03/16/17 0921  . metoprolol succinate (TOPROL-XL) 24 hr tablet 50 mg  50 mg Oral BID Yates DecampGanji, Jay, MD   50 mg at 03/16/17 2200  . ondansetron (ZOFRAN) tablet 4 mg  4 mg Oral Q6H PRN Opyd, Lavone Neriimothy S, MD       Or  . ondansetron (ZOFRAN) injection 4 mg  4 mg Intravenous Q6H PRN Opyd, Lavone Neriimothy S, MD      . potassium chloride SA (K-DUR,KLOR-CON) CR tablet 40 mEq  40 mEq Oral Once Leatha GildingGherghe, Costin M, MD      . RESOURCE THICKENUP CLEAR   Oral PRN Catarina Hartshornat, David, MD      . sodium chloride flush (NS) 0.9 % injection 3 mL  3 mL Intravenous Q12H Opyd, Lavone Neriimothy S, MD   3 mL at 03/16/17 2200  . sodium chloride flush (NS) 0.9 % injection 3 mL  3 mL Intravenous PRN Opyd, Lavone Neriimothy S, MD      . Warfarin - Pharmacist Dosing Inpatient   Does not apply [id number] Catarina Hartshornat, David, MD   Stopped at 03/14/17 1800     Discharge Medications: Please see  discharge summary for a list of discharge medications.  Relevant Imaging Results:  Relevant Lab Results:   Additional Information SSN:  [id number]    Raye SorrowCoble, Jaquitta Dupriest N, [place]

## 2017-03-17 NOTE — Care Management Important Message (Signed)
Important Message  Patient Details  Name: Emily Elliott MRN: [id number] Date of Birth: Nov 24, 1920   Medicare Important Message Given:  Yes    [name], [name] 03/17/2017, 10:42 AMImportant Message  Patient Details  Name: Emily Elliott MRN: [id number] Date of Birth: Nov 24, 1920   Medicare Important Message Given:  Yes    [name], [name] 03/17/2017, 10:42 AM

## 2017-03-17 NOTE — Progress Notes (Signed)
PROGRESS NOTE  Emily Elliott ZOX:[id number] DOB: [birth year] DOA: 03/13/2017 PCP: Patient, No Pcp Per   LOS: 4 days   Brief Narrative / Interim history: 83 year old female with a history of essential hypertension, chronic atrial fibrillation, colon cancer status post hemicolectomy, CKD stage III presenting with one-week history of increasing generalized weakness and lethargy. At baseline, the patient has cognitive impairment and requires assistance with her ADLs including bathing and dressing. However, the patient is able to feed herself and ambulate with a walker. She does require assistance getting out of bed and with transfers at baseline. In the past week, the family has noted increasing generalized weakness with increasing difficulty getting out of bed and bearing any weight. In addition, the patient has had decreased oral intake. The patient's family is also noted increasing "swelling" in her abdomen. The patient was placed on Cipro for presumptive UTI possibly one week prior to this admission. Because of persistent lethargy, she was seen by her primary care provider on 03/13/2017. Reportedly, urinalysis showed bacteriuria and nitrites. Because of concern for persistent UTI and continued lethargy, the patient strictly to the emergency department. In the emergency department, the patient was noted to be hypoxic with oxygen saturation 84% on room air. VBG showed hypercarbia and hypoxia.  She was subsequently placed on BiPAP and stabilized. Chest x-ray showed a right pleural effusion with RLL atelectasis/infiltrate. CT of the abdomen and pelvis showed a large right pleural effusion with adjacent atelectasis. There was a LLQ ostomy, but no other acute findings on CT of the abdomen. Ceftriaxone and azithromycin were given in the emergency department.  Assessment & Plan: Principal Problem:   UTI (urinary tract infection) Active Problems:   Atrial fibrillation, chronic (HCC)   Essential hypertension  Hypernatremia   AKI (acute kidney injury) (HCC)   Acute encephalopathy   History of colon cancer   Pleural effusion, right   Dysphagia   CKD (chronic kidney disease), stage III   Acute respiratory failure with hypoxia and hypercapnia (HCC)   Acute CHF (congestive heart failure) (HCC)   Coagulopathy (HCC)   Acute metabolic encephalopathy -this is believed to be multifactorial due to UTI/decompensated diastolic CHF in the setting of hypoxic respiratory failure due to pleural effusion.  TSH was checked and it was normal.  B12 was checked and was elevated.  Ammonia was within normal limits.  Patient's mental status seems to be improving following the thoracentesis, and she has become more alert and appears close to baseline.  Acute respiratory failure with hypoxia and hypercarbia -due to decompensated dCHF / pleural effusion / concern for CAP, she underwent 0.6 L thoracentesis on 8/7.  Her respiratory status is improved, she is currently on nasal cannula and has not been requiring BiPAP now for few days.  She underwent an echocardiogram on 8/8 (full results below) -repeat CXR this morning with increased opacity on right, likely infiltrate less likely edema as she has diuresed well. Clinically improving. Will continue antibiotics  Concern for CAP -with worsening infiltrate on CXR, favor continuing to treat to 8 days. Once finishes cefepime will convert to Doxy for 3 more days  Right pleural effusion -Likely due to CHF vs parapneumonic, cytology with reactive mesothelial cells  Acute CHF, likely diastolic -She was clinically volume overloaded, started on furosemide, continue IV today, change to po tomorrow.  2D echo done shows ejection fraction of 65-70%, severe concentric hypertrophy, decreased RV systolic function and biatrial dilation.  PA peak pressure was 61.  Cardiology consulted, appreciate Dr. Jacinto Halim  input.  He recommends diltiazem/metoprolol. -weight has improved 158-149 lbs, change Lasix to  po tomorrow  Hypernatremia -Likely due to mild intravascular depletion, closely monitor while on diuretics -stable  UTI  -UA shows TNTC WBC, has been on cefepime for 5 days, last dose tonight.  CKD 3 -Baseline creatinine 1.1-1.3, Presenting creatinine 1.59, creatinine stable 1.49 on 8/7 and 1.47 on 8/8, 1.36 on 8/9 and 1.17 today 8/10 -Monitor with diuresis  Chronic atrial fibrillation -CHADSVASc = 7 -continue warfarin--currently INR supratherapeutic but improving today, no bleeding, closely monitor -rate controlled -Continue metoprolol and Cardizem, blood pressure stable  Dysphagia -Previously evaluated by speech therapy 09/26/16-->dysphagia 3 with thin liquids  History of colon cancer status post colostomy -Had colon resection in 2014 -Consult wound care nurse for ostomy care -No history of chemoradiation therapy.  Cytology in the pleural fluid is pending  Essential hypertension -Continue diltiazem and metoprolol  Coagulopathy  -INR is supratherapeutic -Likely due to recent ciprofloxacin use and poor po intake  Elevated troponin -likely demand ischemia in setting of UTI and decompensated CHF   DVT prophylaxis: Coumadin Code Status: Full code Family Communication: d/w son over the phone (he is an MD) Disposition Plan: SNF 1-2 days  Consultants:  Cardiology   Procedures:   2D echo Study Conclusions - Left ventricle: The cavity size was normal. There was severe concentric hypertrophy. Systolic function was vigorous. The estimated ejection fraction was in the range of 65% to 70%. Wall motion was normal; there were no regional wall motion abnormalities. The study is not technically sufficient to allow evaluation of LV diastolic function. - Mitral valve: Mildly thickened leaflets . There was trivial regurgitation. - Left atrium: Mildly dilated. - Right ventricle: The cavity size was mildly dilated. Low normal systolic function. - Right atrium: Moderately  dilated. - Tricuspid valve: There was moderate regurgitation. - Pulmonic valve: There was mild regurgitation. - Pulmonary arteries: PA peak pressure: 61 mm Hg (S). - Inferior vena cava: The vessel was dilated. The respirophasic diameter changes were blunted (< 50%), consistent with elevated central venous pressure. - Pericardium, extracardiac: There was no pericardial effusion. There was a left pleural effusion.  Impressions: - LVEF 65-70%, severe LVH, normal wall motion, mild LAE, moderate RAE, mild PR, moderate TR, RVSP 61 mmHg, dilated IVC, left pleural effusion.  Antimicrobials:  Cefepime 8/6 >>   Subjective: - no chest pain, shortness of breath, no abdominal pain, nausea or vomiting.    Objective: Vitals:   03/16/17 1331 03/16/17 2224 03/17/17 0630 03/17/17 1252  BP: 120/73 120/65 133/88 109/74  Pulse: 89 98 93 82  Resp: 18 16 18    Temp: 98 F (36.7 C) 97.8 F (36.6 C) 97.7 F (36.5 C)   TempSrc: Oral Oral Oral   SpO2: 98% 97% 97%   Weight:   67.5 kg (148 lb 13 oz)   Height:        Intake/Output Summary (Last 24 hours) at 03/17/17 1344 Last data filed at 03/17/17 0913  Gross per 24 hour  Intake               50 ml  Output              850 ml  Net             -800 ml   Filed Weights   03/14/17 0000 03/14/17 0700 03/17/17 0630  Weight: 72.4 kg (159 lb 9.8 oz) 72.3 kg (159 lb 6.3 oz) 67.5 kg (148 lb 13 oz)  Examination:  Vitals:   03/16/17 1331 03/16/17 2224 03/17/17 0630 03/17/17 1252  BP: 120/73 120/65 133/88 109/74  Pulse: 89 98 93 82  Resp: 18 16 18    Temp: 98 F (36.7 C) 97.8 F (36.6 C) 97.7 F (36.5 C)   TempSrc: Oral Oral Oral   SpO2: 98% 97% 97%   Weight:   67.5 kg (148 lb 13 oz)   Height:        Constitutional: NAD Eyes: lids and conjunctivae normal  ENMT: MMM Neck: supple Respiratory: CTA biL, no wheezing, no crackles Cardiovascular: irregular, edema improving, no MRG  Abdomen: non tender. No guarding. BS + Neurologic: non focal    Data Reviewed: I have independently reviewed following labs and imaging studies   CBC:  Recent Labs Lab 03/13/17 1835 03/14/17 0325 03/16/17 0303  WBC 7.3 7.2 7.4  NEUTROABS 6.0 5.4  --   HGB 10.9* 11.4* 10.0*  HCT 37.1 38.5 34.8*  MCV 95.6 96.7 94.6  PLT 280 246 237   Basic Metabolic Panel:  Recent Labs Lab 03/13/17 1835 03/14/17 0325 03/14/17 1010 03/15/17 0311 03/16/17 0303 03/17/17 0437  NA 151* 150*  --  152* 151* 151*  K 3.6 3.9  --  3.6 3.6 3.4*  CL 110 112*  --  108 104 100*  CO2 34* 32  --  37* 40* 45*  GLUCOSE 115* 94  --  80 92 103*  BUN 47* 43*  --  40* 43* 38*  CREATININE 1.59* 1.49*  --  1.47* 1.36* 1.17*  CALCIUM 8.2* 7.8*  --  8.1* 8.1* 8.2*  MG  --   --  2.9* 2.7*  --   --    GFR: Estimated Creatinine Clearance: 27.7 mL/min (A) (by C-G formula based on SCr of 1.17 mg/dL (H)). Liver Function Tests:  Recent Labs Lab 03/13/17 1835  AST 23  ALT 11*  ALKPHOS 51  BILITOT 0.9  PROT 7.1  ALBUMIN 3.4*   No results for input(s): LIPASE, AMYLASE in the last 168 hours.  Recent Labs Lab 03/14/17 1010  AMMONIA 32   Coagulation Profile:  Recent Labs Lab 03/13/17 2058 03/14/17 0325 03/15/17 0311 03/16/17 0303 03/17/17 0437  INR 6.20* 5.77* 6.37* 5.89* 3.76   Cardiac Enzymes: No results for input(s): CKTOTAL, CKMB, CKMBINDEX, TROPONINI in the last 168 hours. BNP (last 3 results) No results for input(s): PROBNP in the last 8760 hours. HbA1C: No results for input(s): HGBA1C in the last 72 hours. CBG: No results for input(s): GLUCAP in the last 168 hours. Lipid Profile: No results for input(s): CHOL, HDL, LDLCALC, TRIG, CHOLHDL, LDLDIRECT in the last 72 hours. Thyroid Function Tests: No results for input(s): TSH, T4TOTAL, FREET4, T3FREE, THYROIDAB in the last 72 hours. Anemia Panel: No results for input(s): VITAMINB12, FOLATE, FERRITIN, TIBC, IRON, RETICCTPCT in the last 72 hours. Urine analysis:    Component Value Date/Time    COLORURINE YELLOW 03/13/2017 2241   APPEARANCEUR HAZY (A) 03/13/2017 2241   LABSPEC 1.016 03/13/2017 2241   PHURINE 6.0 03/13/2017 2241   GLUCOSEU NEGATIVE 03/13/2017 2241   HGBUR NEGATIVE 03/13/2017 2241   BILIRUBINUR NEGATIVE 03/13/2017 2241   KETONESUR NEGATIVE 03/13/2017 2241   PROTEINUR 100 (A) 03/13/2017 2241   NITRITE NEGATIVE 03/13/2017 2241   LEUKOCYTESUR LARGE (A) 03/13/2017 2241   Sepsis Labs: Invalid input(s): PROCALCITONIN, LACTICIDVEN  Recent Results (from the past 240 hour(s))  Blood culture (routine x 2)     Status: None (Preliminary result)   Collection Time: 03/13/17  6:40 PM  Result Value Ref Range Status   Specimen Description BLOOD LEFT FOREARM  Final   Special Requests   Final    BOTTLES DRAWN AEROBIC AND ANAEROBIC Blood Culture adequate volume   Culture   Final    NO GROWTH 4 DAYS Performed at [facility], [address] [place], [place] [address]    Report Status PENDING  Incomplete  Blood culture (routine x 2)     Status: None (Preliminary result)   Collection Time: 03/13/17  8:58 PM  Result Value Ref Range Status   Specimen Description BLOOD LEFT HAND  Final   Special Requests IN PEDIATRIC BOTTLE Blood Culture adequate volume  Final   Culture   Final    NO GROWTH 3 DAYS Performed at [facility], [address], [place], [place] [address]    Report Status PENDING  Incomplete  Urine culture     Status: Abnormal   Collection Time: 03/13/17 10:41 PM  Result Value Ref Range Status   Specimen Description URINE, CLEAN CATCH  Final   Special Requests Normal  Final   Culture (A)  Final    <10,000 COLONIES/mL INSIGNIFICANT GROWTH Performed at [facility], [address] [place], [place] [address]    Report Status 03/15/2017 FINAL  Final  MRSA PCR Screening     Status: None   Collection Time: 03/13/17 11:44 PM  Result Value Ref Range Status   MRSA by PCR NEGATIVE NEGATIVE Final    Comment:        The GeneXpert MRSA Assay  (FDA approved for NASAL specimens only), is one component of a comprehensive MRSA colonization surveillance program. It is not intended to diagnose MRSA infection nor to guide or monitor treatment for MRSA infections.   Gram stain     Status: None   Collection Time: 03/14/17  4:04 PM  Result Value Ref Range Status   Specimen Description PLEURAL RIGHT  Final   Special Requests BOTTLES DRAWN AEROBIC AND ANAEROBIC  Final   Gram Stain   Final    WBC PRESENT, PREDOMINANTLY PMN NO ORGANISMS SEEN CYTOSPIN SMEAR Performed at [facility], [address], [place], [place] [address]    Report Status 03/14/2017 FINAL  Final  Culture, body fluid-bottle     Status: None (Preliminary result)   Collection Time: 03/14/17  4:04 PM  Result Value Ref Range Status   Specimen Description PLEURAL RIGHT  Final   Special Requests BOTTLES DRAWN AEROBIC AND ANAEROBIC  Final   Culture   Final    NO GROWTH 3 DAYS Performed at [facility], [address], [place], [place] [address]    Report Status PENDING  Incomplete      Radiology Studies: Dg Chest Port 1 View  Result Date: 03/17/2017 CLINICAL DATA:  Shortness of breath. EXAM: PORTABLE CHEST 1 VIEW COMPARISON:  Radiograph of March 14, 2017. FINDINGS: Stable cardiomediastinal silhouette. No pneumothorax is noted. Stable left basilar atelectasis or edema is noted with associated pleural effusion. Increased right basilar edema, atelectasis or infiltrate is noted with associated pleural effusion. Bony thorax is unremarkable. IMPRESSION: Stable left basilar opacity as described above. Increased right basilar opacity is noted concerning for worsening edema, atelectasis or infiltrate with associated pleural effusion. Electronically Signed   By: Lupita Raider, M.D.   On: 03/17/2017 09:33   Scheduled Meds: . atorvastatin  10 mg Oral q1800  . cyanocobalamin  500 mcg Oral Daily  .  diltiazem  120 mg Oral Daily  . feeding supplement (ENSURE  ENLIVE)  237 mL Oral Q24H  . furosemide  40 mg Intravenous Daily  . metoprolol succinate  50 mg Oral BID  . sodium chloride flush  3 mL Intravenous Q12H  . Warfarin - Pharmacist Dosing Inpatient   Does not apply q1800   Continuous Infusions: . sodium chloride    . ceFEPime (MAXIPIME) IV Stopped (03/16/17 1929)   Pamella Pert, MD, PhD [facility] Pager [phone]  If 7PM-7AM, please contact night-coverage [email or web address] Password [email or web address] 03/17/2017, 1:44 PM

## 2017-03-17 NOTE — Clinical Social Work Placement (Signed)
   CLINICAL SOCIAL WORK PLACEMENT  NOTE  Date:  03/17/2017  Patient Details  Name: Emily NettleMavis Elliott MRN: [id number] Date of Birth: September 29, 1920  Clinical Social Work is seeking post-discharge placement for this patient at the Skilled  Nursing Facility level of care (*CSW will initial, date and re-position this form in  chart as items are completed):  Yes   Patient/family provided with [facility] Clinical Social Work Department's list of facilities offering this level of care within the geographic area requested by the patient (or if unable, by the patient's family).  Yes   Patient/family informed of their freedom to choose among providers that offer the needed level of care, that participate in Medicare, Medicaid or managed care program needed by the patient, have an available bed and are willing to accept the patient.  Yes   Patient/family informed of [facility]'s ownership interest in [facility] and [facility], as well as of the fact that they are under no obligation to receive care at these facilities.  PASRR submitted to EDS on 03/17/17     PASRR number received on 03/17/17     Existing PASRR number confirmed on       FL2 transmitted to all facilities in geographic area requested by pt/family on 03/17/17     FL2 transmitted to all facilities within larger geographic area on       Patient informed that his/her managed care company has contracts with or will negotiate with certain facilities, including the following:            Patient/family informed of bed offers received.  Patient chooses bed at       Physician recommends and patient chooses bed at      Patient to be transferred to   on  .  Patient to be transferred to facility by       Patient family notified on   of transfer.  Name of family member notified:        PHYSICIAN Please sign FL2     Additional Comment:    _______________________________________________ Raye Sorrowoble, Landen Breeland N, LCSW 03/17/2017, 1:10  PM

## 2017-03-17 NOTE — Progress Notes (Signed)
ANTICOAGULATION CONSULT NOTE   Pharmacy Consult for warfarin Indication: hx atrial fibrillation  Allergies  Allergen Reactions  . Benadryl [Diphenhydramine]     rash    Patient Measurements: Height: 5\' 3"  (160 cm) Weight: 148 lb 13 oz (67.5 kg) IBW/kg (Calculated) : 52.4   Vital Signs: Temp: 97.7 F (36.5 C) (08/10 0630) Temp Source: Oral (08/10 0630) BP: 133/88 (08/10 0630) Pulse Rate: 93 (08/10 0630)  Labs:  Recent Labs  03/15/17 0311 03/16/17 0303 03/17/17 0437  HGB  --  10.0*  --   HCT  --  34.8*  --   PLT  --  237  --   LABPROT 58.0* 54.5* 38.1*  INR 6.37* 5.89* 3.76  CREATININE 1.47* 1.36* 1.17*    Estimated Creatinine Clearance: 27.7 mL/min (A) (by C-G formula based on SCr of 1.17 mg/dL (H)).   Medical History: Past Medical History:  Diagnosis Date  . Atrial fibrillation (HCC)   . Hypertension     Medications:  Home warfarin regimen:1.5 mg daily except 2 mg on Tuesdays  Assessment: Patient's a 83 y.o F with hx afib on warfarin PTA, presented to the ED on 03/13/17 with c/o dysuria.  To resume warfarin while patient is hospitalized.  Of note, patient was on cipro PTA and was instructed by her PCP to hold warfarin (d/t elevated INR secondary to drug-drug intxns) until she has completed her course of cipro (last warfarin dose taken on 8/2, scheduled to resume warfarin back on 03/14/17).   Today, 03/17/2017: - INR remains elevated at 3.76 but decreasing - hgb 10 yesterday, plt within normal limits - no bleeding reported - s/p thoracentesis 8/8  Goal of Therapy:  INR 2-3 Monitor platelets by anticoagulation protocol: Yes   Plan:  - continue to hold warfarin for now.  Will f/u with INR and resume when appropriate. - daily INR - monitor for s/s of bleeding  Clance BollAmanda Kollyns Mickelson, PharmD, BCPS Pager: [phone] 03/17/2017 9:19 AM

## 2017-03-17 NOTE — Clinical Social Work Note (Signed)
Clinical Social Work Assessment  Patient Details  Name: Emily Elliott MRN: [id number] Date of Birth: 06-Jul-1921  Date of referral:  03/17/17               Reason for consult:  Facility Placement, Discharge Planning                Permission sought to share information with:  Case Manager, Customer service manager, Family Supports Permission granted to share information::  Yes, Verbal Permission Granted  Name::     son/POA Shyloh Derosa and daughter [facility]  Agency::     Relationship::     Contact Information:     Housing/Transportation Living arrangements for the past 2 months:  Single Family Home Source of Information:  Adult Children, Medical Team Patient Interpreter Needed:  None Criminal Activity/Legal Involvement Pertinent to Current Situation/Hospitalization:  No - Comment as needed Significant Relationships:  Adult Children Lives with:  Adult Children Do you feel safe going back to the place where you live?  Yes Need for family participation in patient care:  Yes (Comment) (pt's children primary decision makers. )  Care giving concerns:  Pt from home where she resides with her daughter. Moved here one year ago. Son in [place] is [place] and highly involved as well. Pt requires much assistance with ambulating and ADLs at baseline per daughter. Has ostemy bag with which she needs assistance as well.  Pt confused/drowsy presently but daughter states pt more lucid at baseline.  Pt has been to rehab "several times in the past and done well." Last time 05/2016 in [place]  Facilities manager / plan:  CSW consulted for SNF placement assistance.  Met with pt/daughter at bedside and son via phone. All in agreement to plan for SNF for ST rehab at DC.  (Son feels plan following that will be to return home, however daughter expressing some interest in long term care following rehab. Undetermined as of yet)  PASSR obtained and FL2 completed- referred to area SNFs, Daughter  expresses interest in [place] but was also provided with extensive list of area SNFs as they wish to look into options as bed offers are made.  Plan: SNF at DC- will follow up with family for bed offers.   Employment status:  Retired Forensic scientist:  [employer] PT Recommendations:  [place] / Referral to community resources:  [place]  Patient/Family's Response to care:  Children highly involved in care and express appreciation   Patient/Family's Understanding of and Emotional Response to Diagnosis, Current Treatment, and Prognosis:  Son and daughter both demonstrate thorough understanding of pt's status and potential plans/barriers. Both ask pertinent questions and are accepting of plan. Hopes for outcome following SNF differ (see above)  Emotional Assessment Appearance:  Appears stated age Attitude/Demeanor/Rapport:   (sleeping) Affect (typically observed):   (sleeping) Orientation:   (UTA- sleeping) Alcohol / Substance use:  Not Applicable Psych involvement (Current and /or in the community):  No (Comment)  Discharge Needs  Concerns to be addressed:  Discharge Planning Concerns Readmission within the last 30 days:  No Current discharge risk:  Dependent with Mobility Barriers to Discharge:  Continued Medical Work up   [employer], LCSW 03/17/2017, 4:53 PM  [phone]

## 2017-03-17 NOTE — Progress Notes (Signed)
  Speech Language Pathology Treatment:    Patient Details Name: Emily NettleMavis Elliott MRN: [id number] DOB: January 30, 1921 Today's Date: 03/17/2017 Time: [id number] SLP Time Calculation (min) (ACUTE ONLY): 17 min  Assessment / Plan / Recommendation Clinical Impression  Pt seen to determine readiness for dietary advancement.  Pt in bed, sitting upright and willing to accept intake.  Breathing clinically appears better today however pt does continue with open mouth inhalation breathing pattern immediately post swallow of boluses- concerning for airway infiltration from pressure.  Observed pt with thin soda, Ensure, thickened soda, softened cracker and jello via spoon.   Pt continues with consistent cough s/p swallow of thin liquids.  Again pt did NOT cough on MBS which makes definitive aspiration difficult to determine however with delay in pharyngeal swallow response on MBS- high suspect aspiration of thin liquids as pt stated "it tickles".  Do not recommend solid advancement at this time either due to pt's subtle cough post-swallow concerning for premature loss into larynx and findings of CXR.  Excellent clinical tolerance of nectar via cup and jello.    No family present to educate to clinical reasoning but pt was informed and SLP will continue to follow for dietary advancement readiness, dysphagia management.      HPI HPI: 83 [facility], confusion, somnolence - pt diagnosed with Urinary Tract Infection.  PMH + for old lacunar CVAs - bilateral basal ganglia, thalamic CVA.  Per daughter Emily CaroliMaylene  - pt has been coughing with intake at home and reports issues with "feeling full" pointing to proximal esophagus.  Pt denies this being a significant issue.  She has resided with daughter for the last 18 months.  Pt with ? ascities per imaging study.  CXR showed large right pleural effusion - diagnosed with respiratory acidosis - for thoracentesis today.        SLP Plan  Continue with current plan  of care       Recommendations  Diet recommendations: Dysphagia 1 (puree);Nectar-thick liquid Liquids provided via: Cup;No straw Medication Administration: Whole meds with puree Supervision: Full supervision/cueing for compensatory strategies Compensations: Slow rate;Small sips/bites Postural Changes and/or Swallow Maneuvers: Seated upright 90 degrees;Upright 30-60 min after meal                Oral Care Recommendations: Oral care BID SLP Visit Diagnosis: Dysphagia, oropharyngeal phase (R13.12) Plan: Continue with current plan of care       GO               Donavan Burnetamara Nayeli Calvert, MS [facility] SLP [phone]  [name], Soul Deveney [name] 03/17/2017, 2:11 PM

## 2017-03-18 LAB — BASIC METABOLIC PANEL
ANION GAP: 5 (ref 5–15)
BUN: 45 mg/dL — ABNORMAL HIGH (ref 6–20)
CALCIUM: 8.2 mg/dL — AB (ref 8.9–10.3)
CO2: 44 mmol/L — ABNORMAL HIGH (ref 22–32)
Chloride: 104 mmol/L (ref 101–111)
Creatinine, Ser: 1.21 mg/dL — ABNORMAL HIGH (ref 0.44–1.00)
GFR, EST AFRICAN AMERICAN: 43 mL/min — AB (ref >60)
GFR, EST NON AFRICAN AMERICAN: 37 mL/min — AB (ref >60)
GLUCOSE: 99 mg/dL (ref 65–99)
POTASSIUM: 4.5 mmol/L (ref 3.5–5.1)
Sodium: 153 mmol/L — ABNORMAL HIGH (ref 135–145)

## 2017-03-18 LAB — CULTURE, BLOOD (ROUTINE X 2)
CULTURE: NO GROWTH
SPECIAL REQUESTS: ADEQUATE

## 2017-03-18 LAB — PROTIME-INR
INR: 2.76
PROTHROMBIN TIME: 29.7 s — AB (ref 11.4–15.2)

## 2017-03-18 MED ORDER — WARFARIN 0.5 MG HALF TABLET
0.5000 mg | ORAL_TABLET | Freq: Once | ORAL | Status: AC
  Administered 2017-03-18: 0.5 mg via ORAL
  Filled 2017-03-18: qty 1

## 2017-03-18 NOTE — Progress Notes (Signed)
PROGRESS NOTE  Emily Elliott ZHY:[id number] DOB: 12-08-1920 DOA: 03/13/2017 PCP: Patient, No Pcp Per   LOS: 5 days   Brief Narrative / Interim history: 83 year old female with a history of essential hypertension, chronic atrial fibrillation, colon cancer status post hemicolectomy, CKD stage III presenting with one-week history of increasing generalized weakness and lethargy. At baseline, the patient has cognitive impairment and requires assistance with her ADLs including bathing and dressing. However, the patient is able to feed herself and ambulate with a walker. She does require assistance getting out of bed and with transfers at baseline. In the past week, the family has noted increasing generalized weakness with increasing difficulty getting out of bed and bearing any weight. In addition, the patient has had decreased oral intake. The patient's family is also noted increasing "swelling" in her abdomen. The patient was placed on Cipro for presumptive UTI possibly one week prior to this admission. Because of persistent lethargy, she was seen by her primary care provider on 03/13/2017. Reportedly, urinalysis showed bacteriuria and nitrites. Because of concern for persistent UTI and continued lethargy, the patient strictly to the emergency department. In the emergency department, the patient was noted to be hypoxic with oxygen saturation 84% on room air. VBG showed hypercarbia and hypoxia.  She was subsequently placed on BiPAP and stabilized. Chest x-ray showed a right pleural effusion with RLL atelectasis/infiltrate. CT of the abdomen and pelvis showed a large right pleural effusion with adjacent atelectasis. There was a LLQ ostomy, but no other acute findings on CT of the abdomen. Ceftriaxone and azithromycin were given in the emergency department.  Assessment & Plan: Principal Problem:   UTI (urinary tract infection) Active Problems:   Atrial fibrillation, chronic (HCC)   Essential hypertension  Hypernatremia   AKI (acute kidney injury) (HCC)   Acute encephalopathy   History of colon cancer   Pleural effusion, right   Dysphagia   CKD (chronic kidney disease), stage III   Acute respiratory failure with hypoxia and hypercapnia (HCC)   Acute CHF (congestive heart failure) (HCC)   Coagulopathy (HCC)   Acute metabolic encephalopathy -this is believed to be multifactorial due to UTI/decompensated diastolic CHF in the setting of hypoxic respiratory failure due to pleural effusion.  TSH was checked and it was normal.  B12 was checked and was elevated.  Ammonia was within normal limits.  Patient's mental status seems to be improving following the thoracentesis, and she has become more alert and appears close to baseline. -somewhat sleepy this morning but waking up more in the pm. Monitor for today   Acute respiratory failure with hypoxia and hypercarbia -due to decompensated dCHF / pleural effusion / concern for CAP, she underwent 0.6 L thoracentesis on 8/7.  Her respiratory status is improved, she is currently on nasal cannula and has not been requiring BiPAP now for few days.  She underwent an echocardiogram on 8/8 (full results below) -repeat CXR this morning with increased opacity on right, likely infiltrate less likely edema as she has diuresed well. Clinically improving. Will continue antibiotics  Concern for CAP -with worsening infiltrate on CXR, favor continuing to treat to 8 days. Once finishes cefepime will convert to Doxy for 3 more days  Right pleural effusion -Likely due to CHF vs parapneumonic, cytology with reactive mesothelial cells without evidence for malignancy  Acute CHF, likely diastolic -She was clinically volume overloaded, started on furosemide, continue IV today, change to po tomorrow.  2D echo done shows ejection fraction of 65-70%, severe concentric hypertrophy,  decreased RV systolic function and biatrial dilation.  PA peak pressure was 61.  Cardiology consulted,  appreciate Dr. Jacinto Halim input.  He recommends diltiazem/metoprolol. -weight has improved 158-149-137 lbs, hold lasix today  Hypernatremia -Likely due to mild intravascular depletion, closely monitor while on diuretics -stable. Hold lasix today. Repeat in am   UTI  -UA shows TNTC WBC, has been on cefepime for 5 days  CKD 3 -Baseline creatinine 1.1-1.3, Presenting creatinine 1.59, creatinine stable 1.49 on 8/7 and 1.47 on 8/8, 1.36 on 8/9, 1.17 on 8/10 and 1.21 today -Monitor, stop Lasix today   Chronic atrial fibrillation -CHADSVASc = 7 -continue warfarin--currently INR supratherapeutic but improving today, no bleeding, closely monitor -rate controlled -Continue metoprolol and Cardizem, blood pressure stable  Dysphagia -Previously evaluated by speech therapy 09/26/16-->dysphagia 3 with thin liquids  History of colon cancer status post colostomy -Had colon resection in 2014 -Consult wound care nurse for ostomy care -No history of chemoradiation therapy.  Cytology in the pleural fluid is pending  Essential hypertension -Continue diltiazem and metoprolol  Coagulopathy  -INR is supratherapeutic -Likely due to recent ciprofloxacin use and poor po intake  Elevated troponin -likely demand ischemia in setting of UTI and decompensated CHF   DVT prophylaxis: Coumadin Code Status: Full code Family Communication: d/w son over the phone (he is an MD) Disposition Plan: SNF 1-2 days  Consultants:  Cardiology   Procedures:   2D echo Study Conclusions - Left ventricle: The cavity size was normal. There was severe concentric hypertrophy. Systolic function was vigorous. The estimated ejection fraction was in the range of 65% to 70%. Wall motion was normal; there were no regional wall motion abnormalities. The study is not technically sufficient to allow evaluation of LV diastolic function. - Mitral valve: Mildly thickened leaflets . There was trivial regurgitation. - Left atrium:  Mildly dilated. - Right ventricle: The cavity size was mildly dilated. Low normal systolic function. - Right atrium: Moderately dilated. - Tricuspid valve: There was moderate regurgitation. - Pulmonic valve: There was mild regurgitation. - Pulmonary arteries: PA peak pressure: 61 mm Hg (S). - Inferior vena cava: The vessel was dilated. The respirophasic diameter changes were blunted (< 50%), consistent with elevated central venous pressure. - Pericardium, extracardiac: There was no pericardial effusion. There was a left pleural effusion.  Impressions: - LVEF 65-70%, severe LVH, normal wall motion, mild LAE, moderate RAE, mild PR, moderate TR, RVSP 61 mmHg, dilated IVC, left pleural effusion.  Antimicrobials:  Cefepime 8/6 >>   Subjective: - no chest pain, shortness of breath, no abdominal pain, nausea or vomiting.   Objective: Vitals:   03/17/17 1252 03/17/17 1401 03/17/17 2028 03/18/17 0512  BP: 109/74 113/69 98/62 125/71  Pulse: 82 90 (!) 103 83  Resp:  18 18 16   Temp:  97.7 F (36.5 C) 98.7 F (37.1 C) 98.3 F (36.8 C)  TempSrc:  Oral Oral Oral  SpO2:  99% 97% 95%  Weight:    62.5 kg (137 lb 12.6 oz)  Height:        Intake/Output Summary (Last 24 hours) at 03/18/17 1412 Last data filed at 03/18/17 1154  Gross per 24 hour  Intake              410 ml  Output             1375 ml  Net             -965 ml   Filed Weights   03/14/17 0700 03/17/17 0630  03/18/17 0512  Weight: 72.3 kg (159 lb 6.3 oz) 67.5 kg (148 lb 13 oz) 62.5 kg (137 lb 12.6 oz)    Examination:  Vitals:   03/17/17 1252 03/17/17 1401 03/17/17 2028 03/18/17 0512  BP: 109/74 113/69 98/62 125/71  Pulse: 82 90 (!) 103 83  Resp:  18 18 16   Temp:  97.7 F (36.5 C) 98.7 F (37.1 C) 98.3 F (36.8 C)  TempSrc:  Oral Oral Oral  SpO2:  99% 97% 95%  Weight:    62.5 kg (137 lb 12.6 oz)  Height:        Constitutional: NAD Eyes: no scleral icterus ENMT: MMM Neck: supple, no masses Respiratory: CTA  biL, shallow breathing, no wheezing  Cardiovascular: irregular, trace LE edema Abdomen: non tender, no guarding. BS + Neurologic: no focal findings  Data Reviewed: I have independently reviewed following labs and imaging studies   CBC:  Recent Labs Lab 03/13/17 1835 03/14/17 0325 03/16/17 0303  WBC 7.3 7.2 7.4  NEUTROABS 6.0 5.4  --   HGB 10.9* 11.4* 10.0*  HCT 37.1 38.5 34.8*  MCV 95.6 96.7 94.6  PLT 280 246 237   Basic Metabolic Panel:  Recent Labs Lab 03/14/17 0325 03/14/17 1010 03/15/17 0311 03/16/17 0303 03/17/17 0437 03/18/17 0530  NA 150*  --  152* 151* 151* 153*  K 3.9  --  3.6 3.6 3.4* 4.5  CL 112*  --  108 104 100* 104  CO2 32  --  37* 40* 45* 44*  GLUCOSE 94  --  80 92 103* 99  BUN 43*  --  40* 43* 38* 45*  CREATININE 1.49*  --  1.47* 1.36* 1.17* 1.21*  CALCIUM 7.8*  --  8.1* 8.1* 8.2* 8.2*  MG  --  2.9* 2.7*  --   --   --    GFR: Estimated Creatinine Clearance: 24 mL/min (A) (by C-G formula based on SCr of 1.21 mg/dL (H)). Liver Function Tests:  Recent Labs Lab 03/13/17 1835  AST 23  ALT 11*  ALKPHOS 51  BILITOT 0.9  PROT 7.1  ALBUMIN 3.4*   No results for input(s): LIPASE, AMYLASE in the last 168 hours.  Recent Labs Lab 03/14/17 1010  AMMONIA 32   Coagulation Profile:  Recent Labs Lab 03/14/17 0325 03/15/17 0311 03/16/17 0303 03/17/17 0437 03/18/17 0451  INR 5.77* 6.37* 5.89* 3.76 2.76   Cardiac Enzymes: No results for input(s): CKTOTAL, CKMB, CKMBINDEX, TROPONINI in the last 168 hours. BNP (last 3 results) No results for input(s): PROBNP in the last 8760 hours. HbA1C: No results for input(s): HGBA1C in the last 72 hours. CBG: No results for input(s): GLUCAP in the last 168 hours. Lipid Profile: No results for input(s): CHOL, HDL, LDLCALC, TRIG, CHOLHDL, LDLDIRECT in the last 72 hours. Thyroid Function Tests: No results for input(s): TSH, T4TOTAL, FREET4, T3FREE, THYROIDAB in the last 72 hours. Anemia Panel: No results  for input(s): VITAMINB12, FOLATE, FERRITIN, TIBC, IRON, RETICCTPCT in the last 72 hours. Urine analysis:    Component Value Date/Time   COLORURINE YELLOW 03/13/2017 2241   APPEARANCEUR HAZY (A) 03/13/2017 2241   LABSPEC 1.016 03/13/2017 2241   PHURINE 6.0 03/13/2017 2241   GLUCOSEU NEGATIVE 03/13/2017 2241   HGBUR NEGATIVE 03/13/2017 2241   BILIRUBINUR NEGATIVE 03/13/2017 2241   KETONESUR NEGATIVE 03/13/2017 2241   PROTEINUR 100 (A) 03/13/2017 2241   NITRITE NEGATIVE 03/13/2017 2241   LEUKOCYTESUR LARGE (A) 03/13/2017 2241   Sepsis Labs: Invalid input(s): PROCALCITONIN, LACTICIDVEN  Recent  Results (from the past 240 hour(s))  Blood culture (routine x 2)     Status: None   Collection Time: 03/13/17  6:40 PM  Result Value Ref Range Status   Specimen Description BLOOD LEFT FOREARM  Final   Special Requests   Final    BOTTLES DRAWN AEROBIC AND ANAEROBIC Blood Culture adequate volume   Culture   Final    NO GROWTH 5 DAYS Performed at [facility], [address] [place], [place] [address]    Report Status 03/18/2017 FINAL  Final  Blood culture (routine x 2)     Status: None (Preliminary result)   Collection Time: 03/13/17  8:58 PM  Result Value Ref Range Status   Specimen Description BLOOD LEFT HAND  Final   Special Requests IN PEDIATRIC BOTTLE Blood Culture adequate volume  Final   Culture   Final    NO GROWTH 4 DAYS Performed at [facility], [address] [place], [place] [address]    Report Status PENDING  Incomplete  Urine culture     Status: Abnormal   Collection Time: 03/13/17 10:41 PM  Result Value Ref Range Status   Specimen Description URINE, CLEAN CATCH  Final   Special Requests Normal  Final   Culture (A)  Final    <10,000 COLONIES/mL INSIGNIFICANT GROWTH Performed at [facility], [address] [place], [place] [address]    Report Status 03/15/2017 FINAL  Final  MRSA PCR Screening     Status: None   Collection Time: 03/13/17 11:44 PM    Result Value Ref Range Status   MRSA by PCR NEGATIVE NEGATIVE Final    Comment:        The GeneXpert MRSA Assay (FDA approved for NASAL specimens only), is one component of a comprehensive MRSA colonization surveillance program. It is not intended to diagnose MRSA infection nor to guide or monitor treatment for MRSA infections.   Gram stain     Status: None   Collection Time: 03/14/17  4:04 PM  Result Value Ref Range Status   Specimen Description PLEURAL RIGHT  Final   Special Requests BOTTLES DRAWN AEROBIC AND ANAEROBIC  Final   Gram Stain   Final    WBC PRESENT, PREDOMINANTLY PMN NO ORGANISMS SEEN CYTOSPIN SMEAR Performed at [facility], [address], [place], [place] [address]    Report Status 03/14/2017 FINAL  Final  Culture, body fluid-bottle     Status: None (Preliminary result)   Collection Time: 03/14/17  4:04 PM  Result Value Ref Range Status   Specimen Description PLEURAL RIGHT  Final   Special Requests BOTTLES DRAWN AEROBIC AND ANAEROBIC  Final   Culture   Final    NO GROWTH 4 DAYS Performed at [facility], [address], [place], [place] [address]    Report Status PENDING  Incomplete      Radiology Studies: Dg Chest Port 1 View  Result Date: 03/17/2017 CLINICAL DATA:  Shortness of breath. EXAM: PORTABLE CHEST 1 VIEW COMPARISON:  Radiograph of March 14, 2017. FINDINGS: Stable cardiomediastinal silhouette. No pneumothorax is noted. Stable left basilar atelectasis or edema is noted with associated pleural effusion. Increased right basilar edema, atelectasis or infiltrate is noted with associated pleural effusion. Bony thorax is unremarkable. IMPRESSION: Stable left basilar opacity as described above. Increased right basilar opacity is noted concerning for worsening edema, atelectasis or infiltrate with associated pleural effusion. Electronically Signed   By: Roque Lias  Montez Hageman, M.D.   On: 03/17/2017 09:33   Scheduled Meds: . atorvastatin  10 mg  Oral q1800  . cyanocobalamin  500 mcg Oral Daily  . diltiazem  120 mg Oral Daily  . feeding supplement (ENSURE ENLIVE)  237 mL Oral Q24H  . metoprolol succinate  50 mg Oral BID  . sodium chloride flush  3 mL Intravenous Q12H  . warfarin  0.5 mg Oral ONCE-1800  . Warfarin - Pharmacist Dosing Inpatient   Does not apply q1800   Continuous Infusions: . sodium chloride    . ceFEPime (MAXIPIME) IV Stopped (03/17/17 1900)   Pamella Pert, MD, PhD [facility] Pager [phone]  If 7PM-7AM, please contact night-coverage [email or web address] Password [email or web address] 03/18/2017, 2:12 PM

## 2017-03-18 NOTE — Progress Notes (Signed)
ANTICOAGULATION CONSULT NOTE   Pharmacy Consult for warfarin Indication: hx atrial fibrillation  Allergies  Allergen Reactions  . Benadryl [Diphenhydramine]     rash    Patient Measurements: Height: 5\' 3"  (160 cm) Weight: 137 lb 12.6 oz (62.5 kg) IBW/kg (Calculated) : 52.4   Vital Signs: Temp: 98.3 F (36.8 C) (08/11 0512) Temp Source: Oral (08/11 0512) BP: 125/71 (08/11 0512) Pulse Rate: 83 (08/11 0512)  Labs:  Recent Labs  03/16/17 0303 03/17/17 0437 03/18/17 0451 03/18/17 0530  HGB 10.0*  --   --   --   HCT 34.8*  --   --   --   PLT 237  --   --   --   LABPROT 54.5* 38.1* 29.7*  --   INR 5.89* 3.76 2.76  --   CREATININE 1.36* 1.17*  --  1.21*    Estimated Creatinine Clearance: 24 mL/min (A) (by C-G formula based on SCr of 1.21 mg/dL (H)).   Medical History: Past Medical History:  Diagnosis Date  . Atrial fibrillation (HCC)   . Hypertension     Medications:  Home warfarin regimen:1.5 mg daily except 2 mg on Tuesdays  Assessment: Patient's a 83 y.o F with hx afib on warfarin PTA, presented to the ED on 03/13/17 with c/o dysuria.  To resume warfarin while patient is hospitalized.  Of note, patient was on cipro PTA and was instructed by her PCP to hold warfarin (d/t elevated INR secondary to drug-drug intxns) until she has completed her course of cipro (last warfarin dose taken on 8/2, scheduled to resume warfarin back on 03/14/17).   Today, 03/18/2017: - INR now 2.76 after holding warfarin for a week.  Will resume warfarin very conservatively given recent supratherapeutic levels and low dose regimen PTA. No major drug interactions currently. - hgb 10 8/9, plt within normal limits - no bleeding reported - s/p thoracentesis 8/8  Goal of Therapy:  INR 2-3 Monitor platelets by anticoagulation protocol: Yes   Plan:  - warfarin 0.5 mg x 1 tonight - daily INR - monitor for s/s of bleeding  Clance BollAmanda Daisia Slomski, PharmD, BCPS Pager: [phone] 03/18/2017 12:16  PM

## 2017-03-18 NOTE — Clinical Social Work Note (Signed)
CSW spoke in length with patient's daughter Emily RuizMaylene Elliott [phone] and provided her with current SNF bed offers. She stated that she was hoping for placement for [place] in the [place] area.  SW sent referrals to facilities [place] that had not yet received referral.  Discussed bed search process and explained bed offers would need to be facilities of focus as daughter appeared to be trying to visit many facilities based on the SNF list she had been provided.  Multiple facilities did not have any admissions personnel available to give her tours. Will ask Sunday SW coverage to check on the referrals made today to [facility] facilities to see if any further offers were made.  Per MD note today-- anticipate d/c in "1-2 days". Daughter will visit several facilities based on current bed offers as well.  Emily Elliott, [place] [phone]

## 2017-03-19 LAB — CULTURE, BODY FLUID W GRAM STAIN -BOTTLE

## 2017-03-19 LAB — CULTURE, BODY FLUID-BOTTLE: CULTURE: NO GROWTH

## 2017-03-19 LAB — CULTURE, BLOOD (ROUTINE X 2)
CULTURE: NO GROWTH
SPECIAL REQUESTS: ADEQUATE

## 2017-03-19 LAB — PROTIME-INR
INR: 2.15
Prothrombin Time: 24.4 seconds — ABNORMAL HIGH (ref 11.4–15.2)

## 2017-03-19 LAB — BASIC METABOLIC PANEL
ANION GAP: 6 (ref 5–15)
BUN: 43 mg/dL — AB (ref 6–20)
CHLORIDE: 106 mmol/L (ref 101–111)
CO2: 44 mmol/L — AB (ref 22–32)
Calcium: 8.3 mg/dL — ABNORMAL LOW (ref 8.9–10.3)
Creatinine, Ser: 1.24 mg/dL — ABNORMAL HIGH (ref 0.44–1.00)
GFR calc non Af Amer: 36 mL/min — ABNORMAL LOW (ref >60)
GFR, EST AFRICAN AMERICAN: 42 mL/min — AB (ref >60)
Glucose, Bld: 103 mg/dL — ABNORMAL HIGH (ref 65–99)
Potassium: 4.4 mmol/L (ref 3.5–5.1)
Sodium: 156 mmol/L — ABNORMAL HIGH (ref 135–145)

## 2017-03-19 MED ORDER — DEXTROSE 5 % IV SOLN
INTRAVENOUS | Status: AC
  Administered 2017-03-19: 12:00:00 via INTRAVENOUS

## 2017-03-19 MED ORDER — WARFARIN 0.5 MG HALF TABLET
0.5000 mg | ORAL_TABLET | Freq: Once | ORAL | Status: AC
  Administered 2017-03-19: 0.5 mg via ORAL
  Filled 2017-03-19: qty 1

## 2017-03-19 NOTE — Progress Notes (Signed)
PROGRESS NOTE  Emily Elliott ZOX:[id number] DOB: 1921/02/22 DOA: 03/13/2017 PCP: Patient, No Pcp Per   LOS: 6 days   Brief Narrative / Interim history: 83 year old female with a history of essential hypertension, chronic atrial fibrillation, colon cancer status post hemicolectomy, CKD stage III presenting with one-week history of increasing generalized weakness and lethargy. At baseline, the patient has cognitive impairment and requires assistance with her ADLs including bathing and dressing. However, the patient is able to feed herself and ambulate with a walker. She does require assistance getting out of bed and with transfers at baseline. In the past week, the family has noted increasing generalized weakness with increasing difficulty getting out of bed and bearing any weight. In addition, the patient has had decreased oral intake. The patient's family is also noted increasing "swelling" in her abdomen. The patient was placed on Cipro for presumptive UTI possibly one week prior to this admission. Because of persistent lethargy, she was seen by her primary care provider on 03/13/2017. Reportedly, urinalysis showed bacteriuria and nitrites. Because of concern for persistent UTI and continued lethargy, the patient strictly to the emergency department. In the emergency department, the patient was noted to be hypoxic with oxygen saturation 84% on room air. VBG showed hypercarbia and hypoxia.  She was subsequently placed on BiPAP and stabilized. Chest x-ray showed a right pleural effusion with RLL atelectasis/infiltrate. CT of the abdomen and pelvis showed a large right pleural effusion with adjacent atelectasis. There was a LLQ ostomy, but no other acute findings on CT of the abdomen. Ceftriaxone and azithromycin were given in the emergency department.  Assessment & Plan: Principal Problem:   UTI (urinary tract infection) Active Problems:   Atrial fibrillation, chronic (HCC)   Essential hypertension  Hypernatremia   AKI (acute kidney injury) (HCC)   Acute encephalopathy   History of colon cancer   Pleural effusion, right   Dysphagia   CKD (chronic kidney disease), stage III   Acute respiratory failure with hypoxia and hypercapnia (HCC)   Acute CHF (congestive heart failure) (HCC)   Coagulopathy (HCC)   Acute metabolic encephalopathy -this is believed to be multifactorial due to UTI/decompensated diastolic CHF in the setting of hypoxic respiratory failure due to pleural effusion.  TSH was checked and it was normal.  B12 was checked and was elevated.  Ammonia was within normal limits.  Patient's mental status seems to be improving following the thoracentesis, and she has become more alert and appears close to baseline. -Mental status is close to baseline today  Acute respiratory failure with hypoxia and hypercarbia -due to decompensated dCHF / pleural effusion / concern for CAP, she underwent 0.6 L thoracentesis on 8/7.  Her respiratory status is improved, she is currently on nasal cannula and has not been requiring BiPAP now for few days.  She underwent an echocardiogram on 8/8 (full results below) -Respiratory status is stable, on minimal oxygen of 1 L nasal cannula  Concern for CAP -with worsening infiltrate on CXR, favor continuing to treat to 8 days.  Keep on IV antibiotics until discharge, today day 7  Right pleural effusion -Likely due to CHF vs parapneumonic, cytology with reactive mesothelial cells without evidence for malignancy  Acute CHF, likely diastolic -She was clinically volume overloaded, started on furosemide and received IV furosemide up until 8/10 -Her weight has gone from 158-138, and patient does appear clinically dry right now -2D echo done shows ejection fraction of 65-70%, severe concentric hypertrophy, decreased RV systolic function and biatrial dilation.  PA peak pressure was 61.  Cardiology consulted, appreciate Dr. Jacinto Halim input.  He recommends  diltiazem/metoprolol.  Hypernatremia  -Likely due to mild intravascular depletion, sodium getting a bit higher today at 156, likely effect from the Lasix of 2 days ago, I will give back gentle hydration with D5W, -Continue to hold Lasix, repeat BMP tomorrow morning  UTI  -UA shows TNTC WBC  CKD 3 -Baseline creatinine 1.1-1.3, Presenting creatinine 1.59, creatinine stable 1.49 on 8/7 and 1.47 on 8/8, 1.36 on 8/9, 1.17 on 8/10 and in the 1.2 range yesterday and today -Continue to monitor  Chronic atrial fibrillation -CHADSVASc = 7 -continue warfarin--currently INR supratherapeutic but improving today, no bleeding, closely monitor -rate controlled -Continue metoprolol and Cardizem, blood pressure stable  Dysphagia -Previously evaluated by speech therapy 09/26/16-->dysphagia 3 with thin liquids  History of colon cancer status post colostomy -Had colon resection in 2014 -Consult wound care nurse for ostomy care -No history of chemoradiation therapy.  Cytology in the pleural fluid is negative for malignancy  Essential hypertension -Continue diltiazem and metoprolol  Coagulopathy  -INR was supratherapeutic, now within expected levels  Elevated troponin -likely demand ischemia in setting of UTI and decompensated CHF   DVT prophylaxis: Coumadin Code Status: Full code Family Communication: d/w son over the phone 8/12 (he is an MD) Disposition Plan: SNF 1-2 days once sodium levels stabilize  Consultants:  Cardiology   Procedures:   2D echo Study Conclusions - Left ventricle: The cavity size was normal. There was severe concentric hypertrophy. Systolic function was vigorous. The estimated ejection fraction was in the range of 65% to 70%. Wall motion was normal; there were no regional wall motion abnormalities. The study is not technically sufficient to allow evaluation of LV diastolic function. - Mitral valve: Mildly thickened leaflets . There was trivial regurgitation. -  Left atrium: Mildly dilated. - Right ventricle: The cavity size was mildly dilated. Low normal systolic function. - Right atrium: Moderately dilated. - Tricuspid valve: There was moderate regurgitation. - Pulmonic valve: There was mild regurgitation. - Pulmonary arteries: PA peak pressure: 61 mm Hg (S). - Inferior vena cava: The vessel was dilated. The respirophasic diameter changes were blunted (< 50%), consistent with elevated central venous pressure. - Pericardium, extracardiac: There was no pericardial effusion. There was a left pleural effusion.  Impressions: - LVEF 65-70%, severe LVH, normal wall motion, mild LAE, moderate RAE, mild PR, moderate TR, RVSP 61 mmHg, dilated IVC, left pleural effusion.  Antimicrobials:  Cefepime 8/6 >>   Subjective: -She is doing well this morning, has no complaints, denies any shortness of breath  Objective: Vitals:   03/18/17 0512 03/18/17 1500 03/18/17 2103 03/19/17 0434  BP: 125/71 (!) 111/58 128/72 124/78  Pulse: 83 83 94 85  Resp: 16 16 18 19   Temp: 98.3 F (36.8 C) 98.2 F (36.8 C) 99.1 F (37.3 C) 98.7 F (37.1 C)  TempSrc: Oral Oral Oral Oral  SpO2: 95% 97% 97% 97%  Weight: 62.5 kg (137 lb 12.6 oz)   62.8 kg (138 lb 7.2 oz)  Height:        Intake/Output Summary (Last 24 hours) at 03/19/17 1127 Last data filed at 03/19/17 0200  Gross per 24 hour  Intake              110 ml  Output              825 ml  Net             -715  ml   Filed Weights   03/17/17 0630 03/18/17 0512 03/19/17 0434  Weight: 67.5 kg (148 lb 13 oz) 62.5 kg (137 lb 12.6 oz) 62.8 kg (138 lb 7.2 oz)    Examination:  Vitals:   03/18/17 0512 03/18/17 1500 03/18/17 2103 03/19/17 0434  BP: 125/71 (!) 111/58 128/72 124/78  Pulse: 83 83 94 85  Resp: 16 16 18 19   Temp: 98.3 F (36.8 C) 98.2 F (36.8 C) 99.1 F (37.3 C) 98.7 F (37.1 C)  TempSrc: Oral Oral Oral Oral  SpO2: 95% 97% 97% 97%  Weight: 62.5 kg (137 lb 12.6 oz)   62.8 kg (138 lb 7.2 oz)    Height:        Constitutional: NAD Eyes: No scleral icterus ENMT: Dry mucous membranes Respiratory: Shallow breathing, overall clear to auscultation without wheezing or crackles Cardiovascular: Irregular, trace lower extremity edema Abdomen: No tenderness, positive bowel sounds Neurologic: Strength equal in all 4 extremities, no focal findings  Data Reviewed: I have independently reviewed following labs and imaging studies   CBC:  Recent Labs Lab 03/13/17 1835 03/14/17 0325 03/16/17 0303  WBC 7.3 7.2 7.4  NEUTROABS 6.0 5.4  --   HGB 10.9* 11.4* 10.0*  HCT 37.1 38.5 34.8*  MCV 95.6 96.7 94.6  PLT 280 246 237   Basic Metabolic Panel:  Recent Labs Lab 03/14/17 1010 03/15/17 0311 03/16/17 0303 03/17/17 0437 03/18/17 0530 03/19/17 0915  NA  --  152* 151* 151* 153* 156*  K  --  3.6 3.6 3.4* 4.5 4.4  CL  --  108 104 100* 104 106  CO2  --  37* 40* 45* 44* 44*  GLUCOSE  --  80 92 103* 99 103*  BUN  --  40* 43* 38* 45* 43*  CREATININE  --  1.47* 1.36* 1.17* 1.21* 1.24*  CALCIUM  --  8.1* 8.1* 8.2* 8.2* 8.3*  MG 2.9* 2.7*  --   --   --   --    GFR: Estimated Creatinine Clearance: 23.4 mL/min (A) (by C-G formula based on SCr of 1.24 mg/dL (H)). Liver Function Tests:  Recent Labs Lab 03/13/17 1835  AST 23  ALT 11*  ALKPHOS 51  BILITOT 0.9  PROT 7.1  ALBUMIN 3.4*   No results for input(s): LIPASE, AMYLASE in the last 168 hours.  Recent Labs Lab 03/14/17 1010  AMMONIA 32   Coagulation Profile:  Recent Labs Lab 03/15/17 0311 03/16/17 0303 03/17/17 0437 03/18/17 0451 03/19/17 0445  INR 6.37* 5.89* 3.76 2.76 2.15   Cardiac Enzymes: No results for input(s): CKTOTAL, CKMB, CKMBINDEX, TROPONINI in the last 168 hours. BNP (last 3 results) No results for input(s): PROBNP in the last 8760 hours. HbA1C: No results for input(s): HGBA1C in the last 72 hours. CBG: No results for input(s): GLUCAP in the last 168 hours. Lipid Profile: No results for  input(s): CHOL, HDL, LDLCALC, TRIG, CHOLHDL, LDLDIRECT in the last 72 hours. Thyroid Function Tests: No results for input(s): TSH, T4TOTAL, FREET4, T3FREE, THYROIDAB in the last 72 hours. Anemia Panel: No results for input(s): VITAMINB12, FOLATE, FERRITIN, TIBC, IRON, RETICCTPCT in the last 72 hours. Urine analysis:    Component Value Date/Time   COLORURINE YELLOW 03/13/2017 2241   APPEARANCEUR HAZY (A) 03/13/2017 2241   LABSPEC 1.016 03/13/2017 2241   PHURINE 6.0 03/13/2017 2241   GLUCOSEU NEGATIVE 03/13/2017 2241   HGBUR NEGATIVE 03/13/2017 2241   BILIRUBINUR NEGATIVE 03/13/2017 2241   KETONESUR NEGATIVE 03/13/2017 2241   PROTEINUR  100 (A) 03/13/2017 2241   NITRITE NEGATIVE 03/13/2017 2241   LEUKOCYTESUR LARGE (A) 03/13/2017 2241   Sepsis Labs: Invalid input(s): PROCALCITONIN, LACTICIDVEN  Recent Results (from the past 240 hour(s))  Blood culture (routine x 2)     Status: None   Collection Time: 03/13/17  6:40 PM  Result Value Ref Range Status   Specimen Description BLOOD LEFT FOREARM  Final   Special Requests   Final    BOTTLES DRAWN AEROBIC AND ANAEROBIC Blood Culture adequate volume   Culture   Final    NO GROWTH 5 DAYS Performed at [facility], [address] [place], [place] [address]    Report Status 03/18/2017 FINAL  Final  Blood culture (routine x 2)     Status: None (Preliminary result)   Collection Time: 03/13/17  8:58 PM  Result Value Ref Range Status   Specimen Description BLOOD LEFT HAND  Final   Special Requests IN PEDIATRIC BOTTLE Blood Culture adequate volume  Final   Culture   Final    NO GROWTH 4 DAYS Performed at [facility], [address] [place], [place] [address]    Report Status PENDING  Incomplete  Urine culture     Status: Abnormal   Collection Time: 03/13/17 10:41 PM  Result Value Ref Range Status   Specimen Description URINE, CLEAN CATCH  Final   Special Requests Normal  Final   Culture (A)  Final    <10,000 COLONIES/mL  INSIGNIFICANT GROWTH Performed at [facility], [address] [place], [place] [address]    Report Status 03/15/2017 FINAL  Final  MRSA PCR Screening     Status: None   Collection Time: 03/13/17 11:44 PM  Result Value Ref Range Status   MRSA by PCR NEGATIVE NEGATIVE Final    Comment:        The GeneXpert MRSA Assay (FDA approved for NASAL specimens only), is one component of a comprehensive MRSA colonization surveillance program. It is not intended to diagnose MRSA infection nor to guide or monitor treatment for MRSA infections.   Gram stain     Status: None   Collection Time: 03/14/17  4:04 PM  Result Value Ref Range Status   Specimen Description PLEURAL RIGHT  Final   Special Requests BOTTLES DRAWN AEROBIC AND ANAEROBIC  Final   Gram Stain   Final    WBC PRESENT, PREDOMINANTLY PMN NO ORGANISMS SEEN CYTOSPIN SMEAR Performed at [facility], [address] [place], [place] [address]    Report Status 03/14/2017 FINAL  Final  Culture, body fluid-bottle     Status: None (Preliminary result)   Collection Time: 03/14/17  4:04 PM  Result Value Ref Range Status   Specimen Description PLEURAL RIGHT  Final   Special Requests BOTTLES DRAWN AEROBIC AND ANAEROBIC  Final   Culture   Final    NO GROWTH 4 DAYS Performed at [facility], [address] [place], [place] [address]    Report Status PENDING  Incomplete    Radiology Studies: No results found. Scheduled Meds: . atorvastatin  10 mg Oral q1800  . cyanocobalamin  500 mcg Oral Daily  . diltiazem  120 mg Oral Daily  . feeding supplement (ENSURE ENLIVE)  237 mL Oral Q24H  . metoprolol succinate  50 mg Oral BID  . sodium chloride flush  3 mL Intravenous Q12H  . Warfarin - Pharmacist Dosing Inpatient   Does not apply [phone]  Continuous Infusions: . sodium chloride    . ceFEPime (MAXIPIME) IV Stopped (03/18/17 1909)  . dextrose      Pamella Pert, MD, PhD [facility] Pager [phone]  [phone]   If 7PM-7AM, please contact night-coverage [email or web address] Password [facility] 03/19/2017, 11:27 AM

## 2017-03-19 NOTE — Progress Notes (Signed)
ANTICOAGULATION CONSULT NOTE   Pharmacy Consult for warfarin Indication: hx atrial fibrillation  Allergies  Allergen Reactions  . Benadryl [Diphenhydramine]     rash    Patient Measurements: Height: 5\' 3"  (160 cm) Weight: 138 lb 7.2 oz (62.8 kg) IBW/kg (Calculated) : 52.4   Vital Signs: Temp: 98.7 F (37.1 C) (08/12 0434) Temp Source: Oral (08/12 0434) BP: 124/78 (08/12 0434) Pulse Rate: 85 (08/12 0434)  Labs:  Recent Labs  03/17/17 0437 03/18/17 0451 03/18/17 0530 03/19/17 0445 03/19/17 0915  LABPROT 38.1* 29.7*  --  24.4*  --   INR 3.76 2.76  --  2.15  --   CREATININE 1.17*  --  1.21*  --  1.24*    Estimated Creatinine Clearance: 23.4 mL/min (A) (by C-G formula based on SCr of 1.24 mg/dL (H)).   Medical History: Past Medical History:  Diagnosis Date  . Atrial fibrillation (HCC)   . Hypertension     Medications:  Home warfarin regimen:1.5 mg daily except 2 mg on Tuesdays  Assessment: Patient's a 83 y.o F with hx afib on warfarin PTA, presented to the ED on 03/13/17 with c/o dysuria.  To resume warfarin while patient is hospitalized.  Of note, patient was on cipro PTA and was instructed by her PCP to hold warfarin (d/t elevated INR secondary to drug-drug intxns) until she has completed her course of cipro (last warfarin dose taken on 8/2, scheduled to resume warfarin back on 03/14/17).   Today, 03/19/2017: - INR 2.15 after holding warfarin for a week.  Resumed warfarin very conservatively yesterday given recent supratherapeutic levels and low dose regimen PTA. No major drug interactions currently. - hgb 10 8/9, plt within normal limits - no bleeding reported - s/p thoracentesis 8/8  Goal of Therapy:  INR 2-3 Monitor platelets by anticoagulation protocol: Yes   Plan:  - warfarin 0.5 mg x 1 tonight - daily INR - monitor for s/s of bleeding  Clance BollAmanda Jermani Pund, PharmD, BCPS Pager: [phone] 03/19/2017 12:37 PM

## 2017-03-19 NOTE — Progress Notes (Signed)
CSW following to assist with transition to SNF at DC.   Spoke with [place]'s [place]- [place] and [place] of [place]. CSW explained as it is weekend there is possibility [place]. CSW contacted [facility] as this is family's first preference and requested pt's referral be reviewed as soon as possible- representative states they will respond as soon as possible.   Daughter is visiting facilities today in [place] where pt has had bed offers but as of yet has not settled on back up facility choice.   Will follow up with CSW in the am.   Ilean SkillMeghan Tj Kitchings, MSW, LCSW Clinical Social Work 03/19/2017 [phone]

## 2017-03-20 LAB — BASIC METABOLIC PANEL
Anion gap: 6 (ref 5–15)
BUN: 42 mg/dL — ABNORMAL HIGH (ref 6–20)
CALCIUM: 7.9 mg/dL — AB (ref 8.9–10.3)
CO2: 39 mmol/L — ABNORMAL HIGH (ref 22–32)
Chloride: 108 mmol/L (ref 101–111)
Creatinine, Ser: 1.18 mg/dL — ABNORMAL HIGH (ref 0.44–1.00)
GFR calc Af Amer: 45 mL/min — ABNORMAL LOW (ref >60)
GFR, EST NON AFRICAN AMERICAN: 39 mL/min — AB (ref >60)
Glucose, Bld: 102 mg/dL — ABNORMAL HIGH (ref 65–99)
Potassium: 4.5 mmol/L (ref 3.5–5.1)
SODIUM: 153 mmol/L — AB (ref 135–145)

## 2017-03-20 LAB — PROTIME-INR
INR: 1.7
PROTHROMBIN TIME: 20.2 s — AB (ref 11.4–15.2)

## 2017-03-20 MED ORDER — WARFARIN SODIUM 2 MG PO TABS
2.0000 mg | ORAL_TABLET | Freq: Once | ORAL | Status: AC
  Administered 2017-03-20: 2 mg via ORAL
  Filled 2017-03-20: qty 1

## 2017-03-20 MED ORDER — DEXTROSE 5 % IV SOLN
INTRAVENOUS | Status: AC
  Administered 2017-03-20: 11:00:00 via INTRAVENOUS

## 2017-03-20 NOTE — Progress Notes (Signed)
Physical Therapy Treatment Patient Details Name: Emily Elliott MRN: [id number] DOB: [birth year] Today's Date: 03/20/2017    History of Present Illness "83 year old female with a history of essential hypertension, chronic atrial fibrillation, colon cancer status post hemicolectomy, CKD stage III presenting with one-week history of increasing generalized weakness and lethargy. At baseline, the patient has cognitive impairment and requires assistance with her ADLs including bathing and dressing. However, the patient is able to feed herself and ambulate with a walker. She does require assistance getting out of bed and with transfers at baseline"  Pt admitted for UTI    PT Comments    Pt c/o not feeling well today. She appears very drowsy. She agreed to bed level exercises. Recommend SNF.    Follow Up Recommendations  SNF     Equipment Recommendations  None recommended by PT    Recommendations for Other Services       Precautions / Restrictions      Mobility  Bed Mobility                  Transfers                    Ambulation/Gait                 Stairs            Wheelchair Mobility    Modified Rankin (Stroke Patients Only)       Balance                                            Cognition Arousal/Alertness: Awake/alert Behavior During Therapy: WFL for tasks assessed/performed Overall Cognitive Status: History of cognitive impairments - at baseline                                        Exercises General Exercises - Lower Extremity Ankle Circles/Pumps: AROM;Both;10 reps;Supine Hip ABduction/ADduction: AAROM;Both;Supine;10 reps Straight Leg Raises: AAROM;Both;10 reps;Supine    General Comments        Pertinent Vitals/Pain Pain Assessment: Faces Pain Score: 8  Faces Pain Scale: Hurts even more Pain Location: legs Pain Descriptors / Indicators: Sore Pain Intervention(s): Limited activity  within patient's tolerance    Home Living                      Prior Function            PT Goals (current goals can now be found in the care plan section) Progress towards PT goals: Progressing toward goals    Frequency    Min 3X/week      PT Plan Current plan remains appropriate    Co-evaluation              AM-PAC PT "6 Clicks" Daily Activity  Outcome Measure  Difficulty turning over in bed (including adjusting bedclothes, sheets and blankets)?: Total Difficulty moving from lying on back to sitting on the side of the bed? : Total Difficulty sitting down on and standing up from a chair with arms (e.g., wheelchair, bedside commode, etc,.)?: Total Help needed moving to and from a bed to chair (including a wheelchair)?: A Lot Help needed walking in hospital room?: A Lot Help needed climbing 3-5 steps with a railing? :  Total 6 Click Score: 8    End of Session   Activity Tolerance: Patient tolerated treatment well Patient left: in bed;with call bell/phone within reach;with bed alarm set   PT Visit Diagnosis: Muscle weakness (generalized) (M62.81);Difficulty in walking, not elsewhere classified (R26.2)     Time: 1130-1141 PT Time Calculation (min) (ACUTE ONLY): 11 min  Charges:  $Therapeutic Exercise: 8-22 mins                    G Codes:        Rebeca Alert, MPT Pager: [phone]

## 2017-03-20 NOTE — Progress Notes (Signed)
ANTICOAGULATION CONSULT NOTE   Pharmacy Consult for warfarin Indication: hx atrial fibrillation  Allergies  Allergen Reactions  . Benadryl [Diphenhydramine]     rash   Patient Measurements: Height: 5\' 3"  (160 cm) Weight: 139 lb 12.4 oz (63.4 kg) IBW/kg (Calculated) : 52.4  Vital Signs: Temp: 98.3 F (36.8 C) (08/13 0427) Temp Source: Oral (08/13 0427) BP: 120/63 (08/13 0427) Pulse Rate: 95 (08/13 0427)  Labs:  Recent Labs  03/18/17 0451 03/18/17 0530 03/19/17 0445 03/19/17 0915 03/20/17 0454  LABPROT 29.7*  --  24.4*  --  20.2*  INR 2.76  --  2.15  --  1.70  CREATININE  --  1.21*  --  1.24* 1.18*   Estimated Creatinine Clearance: 26.7 mL/min (A) (by C-G formula based on SCr of 1.18 mg/dL (H)).  Medical History: Past Medical History:  Diagnosis Date  . Atrial fibrillation (HCC)   . Hypertension    Medications:  Home warfarin regimen:1.5 mg daily except 2 mg on Tuesdays  Assessment: 83 y.o F with hx Afib on warfarin PTA, to ED on 03/13/17 with c/o dysuria.  To resume warfarin while patient is hospitalized.  Of note, patient was on cipro PTA and was instructed by her PCP to hold warfarin (d/t elevated INR secondary to drug-drug intxns) until she has completed her course of cipro (last warfarin dose taken on 8/2, scheduled to resume warfarin back on 03/14/17).   Today, 03/20/2017: - INR 2.15 after holding warfarin for a week.  Resumed warfarin very conservatively yesterday given recent supratherapeutic levels and low dose regimen PTA. No major drug interactions currently. - hgb 10 8/9, plt within normal limits - no bleeding reported - s/p thoracentesis 8/8  Goal of Therapy:  INR 2-3 Monitor platelets by anticoagulation protocol: Yes   Plan:  - Warfarin 2 mg x 1 at 1800 - daily INR - monitor for s/s of bleeding  Otho BellowsGreen, Dashiell Franchino L PharmD Pager [phone] 03/20/2017, 12:27 PM

## 2017-03-20 NOTE — Progress Notes (Signed)
PROGRESS NOTE  Emily Elliott ZOX:[id number] DOB: 04/05/1921 DOA: 03/13/2017 PCP: Patient, No Pcp Per   LOS: 7 days   Brief Narrative / Interim history: 83 year old female with a history of essential hypertension, chronic atrial fibrillation, colon cancer status post hemicolectomy, CKD stage III presenting with one-week history of increasing generalized weakness and lethargy. At baseline, the patient has cognitive impairment and requires assistance with her ADLs including bathing and dressing. However, the patient is able to feed herself and ambulate with a walker. She does require assistance getting out of bed and with transfers at baseline. In the past week, the family has noted increasing generalized weakness with increasing difficulty getting out of bed and bearing any weight. In addition, the patient has had decreased oral intake. The patient's family is also noted increasing "swelling" in her abdomen. The patient was placed on Cipro for presumptive UTI possibly one week prior to this admission. Because of persistent lethargy, she was seen by her primary care provider on 03/13/2017. Reportedly, urinalysis showed bacteriuria and nitrites. Because of concern for persistent UTI and continued lethargy, the patient strictly to the emergency department. In the emergency department, the patient was noted to be hypoxic with oxygen saturation 84% on room air. VBG showed hypercarbia and hypoxia.  She was subsequently placed on BiPAP and stabilized. Chest x-ray showed a right pleural effusion with RLL atelectasis/infiltrate. CT of the abdomen and pelvis showed a large right pleural effusion with adjacent atelectasis. There was a LLQ ostomy, but no other acute findings on CT of the abdomen. Ceftriaxone and azithromycin were given in the emergency department.  Assessment & Plan: Principal Problem:   UTI (urinary tract infection) Active Problems:   Atrial fibrillation, chronic (HCC)   Essential hypertension  Hypernatremia   AKI (acute kidney injury) (HCC)   Acute encephalopathy   History of colon cancer   Pleural effusion, right   Dysphagia   CKD (chronic kidney disease), stage III   Acute respiratory failure with hypoxia and hypercapnia (HCC)   Acute CHF (congestive heart failure) (HCC)   Coagulopathy (HCC)   Acute metabolic encephalopathy -this is believed to be multifactorial due to UTI/decompensated diastolic CHF in the setting of hypoxic respiratory failure due to pleural effusion.  TSH was checked and it was normal.  B12 was checked and was elevated.  Ammonia was within normal limits.  Patient's mental status seems to be improving following the thoracentesis, and she has become more alert and appears close to baseline. -Mental status is close to baseline  Acute respiratory failure with hypoxia and hypercarbia -due to decompensated dCHF / pleural effusion / concern for CAP, she underwent 0.6 L thoracentesis on 8/7.  Her respiratory status is improved, she is currently on nasal cannula and has not been requiring BiPAP now for few days.  She underwent an echocardiogram on 8/8 (full results below) -Respiratory status is stable, on minimal oxygen of 1 L nasal cannula  Concern for CAP -with worsening infiltrate on CXR, favor continuing to treat to 8 days.  Keep on IV antibiotics until discharge, today day 8  Right pleural effusion -Likely due to CHF vs parapneumonic, cytology with reactive mesothelial cells without evidence for malignancy  Acute CHF, likely diastolic -She was clinically volume overloaded, started on furosemide and received IV furosemide up until 8/10 -Her weight has gone from 158-138, and patient does appear clinically dry right now -2D echo done shows ejection fraction of 65-70%, severe concentric hypertrophy, decreased RV systolic function and biatrial dilation.  PA  peak pressure was 61.  Cardiology consulted, appreciate Dr. Jacinto Halim input.  He recommends  diltiazem/metoprolol.  Hypernatremia  -Likely due to mild intravascular depletion, sodium getting a bit higher today at 156, likely effect from the Lasix of 2 days ago, I will give back gentle hydration with D5W, -Still a bit hypernatremic still today, give additional D5W  UTI  -UA shows TNTC WBC  CKD 3 -Baseline creatinine 1.1-1.3, Presenting creatinine 1.59, creatinine stable 1.49 on 8/7 and 1.47 on 8/8, 1.36 on 8/9, 1.17 on 8/10 and in the 1.2 range yesterday and today -Continue to monitor  Chronic atrial fibrillation -CHADSVASc = 7 -continue warfarin--currently INR supratherapeutic but improving today, no bleeding, closely monitor -rate controlled -Continue metoprolol and Cardizem, blood pressure stable  Dysphagia -Previously evaluated by speech therapy 09/26/16-->dysphagia 3 with thin liquids  History of colon cancer status post colostomy -Had colon resection in 2014 -Consult wound care nurse for ostomy care -No history of chemoradiation therapy.  Cytology in the pleural fluid is negative for malignancy  Essential hypertension -Continue diltiazem and metoprolol  Coagulopathy  -INR was supratherapeutic, now on low side at 1.7.  Pharmacy following.  Elevated troponin -likely demand ischemia in setting of UTI and decompensated CHF   DVT prophylaxis: Coumadin Code Status: Full code Family Communication: d/w son over the phone 8/12 (he is an MD) Disposition Plan: SNF 1-2 days once sodium levels stabilize  Consultants:  Cardiology   Procedures:   2D echo Study Conclusions - Left ventricle: The cavity size was normal. There was severe concentric hypertrophy. Systolic function was vigorous. The estimated ejection fraction was in the range of 65% to 70%. Wall motion was normal; there were no regional wall motion abnormalities. The study is not technically sufficient to allow evaluation of LV diastolic function. - Mitral valve: Mildly thickened leaflets . There was  trivial regurgitation. - Left atrium: Mildly dilated. - Right ventricle: The cavity size was mildly dilated. Low normal systolic function. - Right atrium: Moderately dilated. - Tricuspid valve: There was moderate regurgitation. - Pulmonic valve: There was mild regurgitation. - Pulmonary arteries: PA peak pressure: 61 mm Hg (S). - Inferior vena cava: The vessel was dilated. The respirophasic diameter changes were blunted (< 50%), consistent with elevated central venous pressure. - Pericardium, extracardiac: There was no pericardial effusion. There was a left pleural effusion.  Impressions: - LVEF 65-70%, severe LVH, normal wall motion, mild LAE, moderate RAE, mild PR, moderate TR, RVSP 61 mmHg, dilated IVC, left pleural effusion.  Antimicrobials:  Cefepime 8/6 >>   Subjective: - no chest pain, shortness of breath, no abdominal pain, nausea or vomiting.   Objective: Vitals:   03/19/17 0434 03/19/17 1534 03/20/17 0427 03/20/17 1044  BP: 124/78 124/76 120/63 115/67  Pulse: 85 84 95 91  Resp: 19 20 20    Temp: 98.7 F (37.1 C) 98.7 F (37.1 C) 98.3 F (36.8 C)   TempSrc: Oral Oral Oral   SpO2: 97% 98% 99%   Weight: 62.8 kg (138 lb 7.2 oz)  63.4 kg (139 lb 12.4 oz)   Height:        Intake/Output Summary (Last 24 hours) at 03/20/17 1105 Last data filed at 03/20/17 0934  Gross per 24 hour  Intake           953.33 ml  Output              900 ml  Net            53.33 ml  Filed Weights   03/18/17 0512 03/19/17 0434 03/20/17 0427  Weight: 62.5 kg (137 lb 12.6 oz) 62.8 kg (138 lb 7.2 oz) 63.4 kg (139 lb 12.4 oz)    Examination:  Vitals:   03/19/17 0434 03/19/17 1534 03/20/17 0427 03/20/17 1044  BP: 124/78 124/76 120/63 115/67  Pulse: 85 84 95 91  Resp: 19 20 20    Temp: 98.7 F (37.1 C) 98.7 F (37.1 C) 98.3 F (36.8 C)   TempSrc: Oral Oral Oral   SpO2: 97% 98% 99%   Weight: 62.8 kg (138 lb 7.2 oz)  63.4 kg (139 lb 12.4 oz)   Height:        Constitutional:  NAD Respiratory: CTA biL Cardiovascular: Irregular, no edema  Data Reviewed: I have independently reviewed following labs and imaging studies   CBC:  Recent Labs Lab 03/13/17 1835 03/14/17 0325 03/16/17 0303  WBC 7.3 7.2 7.4  NEUTROABS 6.0 5.4  --   HGB 10.9* 11.4* 10.0*  HCT 37.1 38.5 34.8*  MCV 95.6 96.7 94.6  PLT 280 246 237   Basic Metabolic Panel:  Recent Labs Lab 03/14/17 1010 03/15/17 0311 03/16/17 0303 03/17/17 0437 03/18/17 0530 03/19/17 0915 03/20/17 0454  NA  --  152* 151* 151* 153* 156* 153*  K  --  3.6 3.6 3.4* 4.5 4.4 4.5  CL  --  108 104 100* 104 106 108  CO2  --  37* 40* 45* 44* 44* 39*  GLUCOSE  --  80 92 103* 99 103* 102*  BUN  --  40* 43* 38* 45* 43* 42*  CREATININE  --  1.47* 1.36* 1.17* 1.21* 1.24* 1.18*  CALCIUM  --  8.1* 8.1* 8.2* 8.2* 8.3* 7.9*  MG 2.9* 2.7*  --   --   --   --   --    GFR: Estimated Creatinine Clearance: 26.7 mL/min (A) (by C-G formula based on SCr of 1.18 mg/dL (H)). Liver Function Tests:  Recent Labs Lab 03/13/17 1835  AST 23  ALT 11*  ALKPHOS 51  BILITOT 0.9  PROT 7.1  ALBUMIN 3.4*   No results for input(s): LIPASE, AMYLASE in the last 168 hours.  Recent Labs Lab 03/14/17 1010  AMMONIA 32   Coagulation Profile:  Recent Labs Lab 03/16/17 0303 03/17/17 0437 03/18/17 0451 03/19/17 0445 03/20/17 0454  INR 5.89* 3.76 2.76 2.15 1.70   Cardiac Enzymes: No results for input(s): CKTOTAL, CKMB, CKMBINDEX, TROPONINI in the last 168 hours. BNP (last 3 results) No results for input(s): PROBNP in the last 8760 hours. HbA1C: No results for input(s): HGBA1C in the last 72 hours. CBG: No results for input(s): GLUCAP in the last 168 hours. Lipid Profile: No results for input(s): CHOL, HDL, LDLCALC, TRIG, CHOLHDL, LDLDIRECT in the last 72 hours. Thyroid Function Tests: No results for input(s): TSH, T4TOTAL, FREET4, T3FREE, THYROIDAB in the last 72 hours. Anemia Panel: No results for input(s): VITAMINB12,  FOLATE, FERRITIN, TIBC, IRON, RETICCTPCT in the last 72 hours. Urine analysis:    Component Value Date/Time   COLORURINE YELLOW 03/13/2017 2241   APPEARANCEUR HAZY (A) 03/13/2017 2241   LABSPEC 1.016 03/13/2017 2241   PHURINE 6.0 03/13/2017 2241   GLUCOSEU NEGATIVE 03/13/2017 2241   HGBUR NEGATIVE 03/13/2017 2241   BILIRUBINUR NEGATIVE 03/13/2017 2241   KETONESUR NEGATIVE 03/13/2017 2241   PROTEINUR 100 (A) 03/13/2017 2241   NITRITE NEGATIVE 03/13/2017 2241   LEUKOCYTESUR LARGE (A) 03/13/2017 2241   Sepsis Labs: Invalid input(s): PROCALCITONIN, LACTICIDVEN  Recent Results (from  the past 240 hour(s))  Blood culture (routine x 2)     Status: None   Collection Time: 03/13/17  6:40 PM  Result Value Ref Range Status   Specimen Description BLOOD LEFT FOREARM  Final   Special Requests   Final    BOTTLES DRAWN AEROBIC AND ANAEROBIC Blood Culture adequate volume   Culture   Final    NO GROWTH 5 DAYS Performed at [facility], [address] [place], [place] [address]    Report Status 03/18/2017 FINAL  Final  Blood culture (routine x 2)     Status: None   Collection Time: 03/13/17  8:58 PM  Result Value Ref Range Status   Specimen Description BLOOD LEFT HAND  Final   Special Requests IN PEDIATRIC BOTTLE Blood Culture adequate volume  Final   Culture   Final    NO GROWTH 5 DAYS Performed at [facility], [address] [place], [place] [address]    Report Status 03/19/2017 FINAL  Final  Urine culture     Status: Abnormal   Collection Time: 03/13/17 10:41 PM  Result Value Ref Range Status   Specimen Description URINE, CLEAN CATCH  Final   Special Requests Normal  Final   Culture (A)  Final    <10,000 COLONIES/mL INSIGNIFICANT GROWTH Performed at [facility], [address] [place], [place] [address]    Report Status 03/15/2017 FINAL  Final  MRSA PCR Screening     Status: None   Collection Time: 03/13/17 11:44 PM  Result Value Ref Range Status   MRSA by  PCR NEGATIVE NEGATIVE Final    Comment:        The GeneXpert MRSA Assay (FDA approved for NASAL specimens only), is one component of a comprehensive MRSA colonization surveillance program. It is not intended to diagnose MRSA infection nor to guide or monitor treatment for MRSA infections.   Gram stain     Status: None   Collection Time: 03/14/17  4:04 PM  Result Value Ref Range Status   Specimen Description PLEURAL RIGHT  Final   Special Requests BOTTLES DRAWN AEROBIC AND ANAEROBIC  Final   Gram Stain   Final    WBC PRESENT, PREDOMINANTLY PMN NO ORGANISMS SEEN CYTOSPIN SMEAR Performed at [facility], [address] [place], [place] [address]    Report Status 03/14/2017 FINAL  Final  Culture, body fluid-bottle     Status: None   Collection Time: 03/14/17  4:04 PM  Result Value Ref Range Status   Specimen Description PLEURAL RIGHT  Final   Special Requests BOTTLES DRAWN AEROBIC AND ANAEROBIC  Final   Culture   Final    NO GROWTH 5 DAYS Performed at [facility], [address] [place], [place] [address]    Report Status 03/19/2017 FINAL  Final    Radiology Studies: No results found. Scheduled Meds: . atorvastatin  10 mg Oral q1800  . cyanocobalamin  500 mcg Oral Daily  . diltiazem  120 mg Oral Daily  . feeding supplement (ENSURE ENLIVE)  237 mL Oral Q24H  . metoprolol succinate  50 mg Oral BID  . sodium chloride flush  3 mL Intravenous Q12H  . Warfarin - Pharmacist Dosing Inpatient   Does not apply q1800   Continuous Infusions: . sodium chloride    . ceFEPime (MAXIPIME) IV Stopped (03/19/17 1749)  . dextrose 100 mL/hr at 03/20/17 1054    Pamella Pertostin Gherghe, MD, PhD [facility] Pager [phone]   If 7PM-7AM, please contact night-coverage [email or web address] Password [email or web address] 03/20/2017, 11:05 AM

## 2017-03-20 NOTE — Progress Notes (Signed)
  Speech Language Pathology Treatment: Dysphagia  Patient Details Name: Emily NettleMavis Hobday MRN: [id number] DOB: Oct 10, 1920 Today's Date: 03/20/2017 Time: [id number] SLP Time Calculation (min) (ACUTE ONLY): 35 min  Assessment / Plan / Recommendation Clinical Impression  Patient seen today to determine tolerance of po diet, readiness for dietary advancement and for family education.  Meal arrived during session - pt today appears more sleepy than Friday 8/10.  She admits to "Not feeling good" and reported pain "8/10" - RN made aware and gave pt pain medicine during session.    Assisted pt with set up for meal - Pt with increased delays in oral transiting today - nurse tech reports pt with "some pocketing" today.  Pt with prolonged oral transiting and did expectorate a piece of food that was not pureed adequately. Do not recommend to advance pt's diet at this time.  Recommend to continue diet and allowing pt to self feed to maximize nutrition/airway protection.  Using teach back, reinforced strategies.    Will follow up for readiness for dietary advancement, dysphagia management.  Thanks!    HPI HPI: 83 yo [facility] with weakness, confusion, somnolence - pt diagnosed with Urinary Tract Infection.  PMH + for old lacunar CVAs - bilateral basal ganglia, thalamic CVA.  Per daughter [name]  - pt has been coughing with intake at home and reports issues with "feeling full" pointing to proximal esophagus.  Pt denies this being a significant issue.  She has resided with daughter for the last 18 months.  Pt with ? ascities per imaging study.  CXR showed large right pleural effusion - diagnosed with respiratory acidosis - for thoracentesis today.        SLP Plan  Continue with current plan of care       Recommendations  Liquids provided via: Cup;No straw Medication Administration: Whole meds with puree Supervision: Full supervision/cueing for compensatory strategies Compensations: Slow rate;Small  sips/bites Postural Changes and/or Swallow Maneuvers: Seated upright 90 degrees;Upright 30-60 min after meal                Oral Care Recommendations: Oral care BID SLP Visit Diagnosis: Dysphagia, oropharyngeal phase (R13.12) Plan: Continue with current plan of care       GO              Donavan Burnetamara Keonna Raether, MS [facility] SLP [phone]   [name], Shervon Kerwin [name] 03/20/2017, 2:08 PM

## 2017-03-20 NOTE — Progress Notes (Signed)
Provided spiritual support at bedside in response to spiritual care consult.  Present for comfort during insertion of new IV.  Pt is catholic.  Is praying for "God's protection and care."  Chaplain shared these prayers with pt.   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  [place]  Speaks of being observant of her care from the perspective of a nurse and states "I understand what they are doing."

## 2017-03-20 NOTE — Care Management Important Message (Signed)
Important Message  Patient Details  Name: Emily NettleMavis Ramires MRN: [id number] Date of Birth: 08/11/1920   Medicare Important Message Given:  Yes    [name], Shereen Marton 03/20/2017, 12:33 PMImportant Message  Patient Details  Name: Emily NettleMavis Gawron MRN: [id number] Date of Birth: 08/11/1920   Medicare Important Message Given:  Yes    [name], Amar Sippel 03/20/2017, 12:33 PM

## 2017-03-21 LAB — BASIC METABOLIC PANEL
Anion gap: 4 — ABNORMAL LOW (ref 5–15)
BUN: 41 mg/dL — AB (ref 6–20)
CHLORIDE: 106 mmol/L (ref 101–111)
CO2: 41 mmol/L — ABNORMAL HIGH (ref 22–32)
Calcium: 8 mg/dL — ABNORMAL LOW (ref 8.9–10.3)
Creatinine, Ser: 1.13 mg/dL — ABNORMAL HIGH (ref 0.44–1.00)
GFR calc non Af Amer: 41 mL/min — ABNORMAL LOW (ref >60)
GFR, EST AFRICAN AMERICAN: 47 mL/min — AB (ref >60)
Glucose, Bld: 100 mg/dL — ABNORMAL HIGH (ref 65–99)
POTASSIUM: 4.5 mmol/L (ref 3.5–5.1)
SODIUM: 151 mmol/L — AB (ref 135–145)

## 2017-03-21 LAB — PROTIME-INR
INR: 1.59
Prothrombin Time: 19.1 seconds — ABNORMAL HIGH (ref 11.4–15.2)

## 2017-03-21 MED ORDER — WARFARIN SODIUM 2 MG PO TABS
2.0000 mg | ORAL_TABLET | Freq: Once | ORAL | Status: AC
  Administered 2017-03-21: 2 mg via ORAL
  Filled 2017-03-21: qty 1

## 2017-03-21 NOTE — Clinical Social Work Placement (Signed)
Pt discharging to [facility] room 101-A. RN report [phone]. Pt will transport via [facility]- completed medical necessity form and arranged transportation.  Pt's daughter [name] aware of DC today and agreeable to plan. All information provided to facility via the HUB.  See below for placement details  CLINICAL SOCIAL WORK PLACEMENT  NOTE  Date:  03/21/2017  Patient Details  Name: Emily NettleMavis Windom MRN: [id number] Date of Birth: [birth year]  Clinical Social Work is seeking post-discharge placement for this patient at the Skilled  Nursing Facility level of care (*CSW will initial, date and re-position this form in  chart as items are completed):  Yes   Patient/family provided with [facility] Clinical Social Work Department's list of facilities offering this level of care within the geographic area requested by the patient (or if unable, by the patient's family).  Yes   Patient/family informed of their freedom to choose among providers that offer the needed level of care, that participate in Medicare, Medicaid or managed care program needed by the patient, have an available bed and are willing to accept the patient.  Yes   Patient/family informed of [facility]'s ownership interest in [facility] and [facility], as well as of the fact that they are under no obligation to receive care at these facilities.  PASRR submitted to EDS on 03/17/17     PASRR number received on 03/17/17     Existing PASRR number confirmed on       FL2 transmitted to all facilities in geographic area requested by pt/family on 03/17/17     FL2 transmitted to all facilities within larger geographic area on       Patient informed that his/her managed care company has contracts with or will negotiate with certain facilities, including the following:        Yes   Patient/family informed of bed offers received.  Patient chooses bed at [facility]     Physician recommends and patient  chooses bed at [facility]    Patient to be transferred to [facility] on 03/21/17.  Patient to be transferred to facility by [facility]     Patient family notified on 03/21/17 of transfer.  Name of family member notified:  Daughter [name]     PHYSICIAN Please sign FL2     Additional Comment:    _______________________________________________ Nelwyn SalisburyMeghan R Rosabelle Jupin, LCSW 03/21/2017, 11:13 AM

## 2017-03-21 NOTE — Progress Notes (Signed)
ANTICOAGULATION CONSULT NOTE   Pharmacy Consult for warfarin Indication: hx atrial fibrillation  Allergies  Allergen Reactions  . Benadryl [Diphenhydramine]     rash   Patient Measurements: Height: 5\' 3"  (160 cm) Weight: 156 lb 12 oz (71.1 kg) IBW/kg (Calculated) : 52.4  Vital Signs: Temp: 98.3 F (36.8 C) (08/14 0530) Temp Source: Oral (08/14 0530) BP: 116/74 (08/14 0530) Pulse Rate: 83 (08/14 0530)  Labs:  Recent Labs  03/19/17 0445 03/19/17 0915 03/20/17 0454 03/21/17 0425  LABPROT 24.4*  --  20.2* 19.1*  INR 2.15  --  1.70 1.59  CREATININE  --  1.24* 1.18* 1.13*   Estimated Creatinine Clearance: 29.4 mL/min (A) (by C-G formula based on SCr of 1.13 mg/dL (H)).  Medical History: Past Medical History:  Diagnosis Date  . Atrial fibrillation (HCC)   . Hypertension    Medications:  Home warfarin regimen:1.5 mg daily except 2 mg on Tuesdays  Assessment: 83 y.o F with hx Afib on warfarin PTA, to ED on 03/13/17 with c/o dysuria.  To resume warfarin while patient is hospitalized.  Of note, patient was on cipro PTA and was instructed by her PCP to hold warfarin (d/t elevated INR secondary to drug-drug intxns) until she has completed her course of cipro (last warfarin dose taken on 8/2, scheduled to resume warfarin back on 03/14/17).   Today, 03/21/2017: - Resumed warfarin 8/11 very conservatively yesterday given recent supratherapeutic levels and low dose regimen PTA. No major drug interactions currently. - INR 1.59 today, below therapeutic range, despite increased dose Warfarin - last CBC 8/9, wnl - s/p thoracentesis 8/8  Goal of Therapy:  INR 2-3 Monitor platelets by anticoagulation protocol: Yes   Plan:  - Warfarin 2 mg today at 12N - daily INR - CBC in am - monitor for s/s of bleeding  Otho BellowsGreen, Dewitte Vannice L PharmD Pager [phone] 03/21/2017, 10:09 AM

## 2017-03-21 NOTE — Discharge Summary (Signed)
Physician Discharge Summary  Emily Elliott ZOX:[id number] DOB: 11/13/1920 DOA: 03/13/2017  PCP: Patient, No Pcp Per  Admit date: 03/13/2017 Discharge date: 03/21/2017  Admitted From: home Disposition:  SNF  Recommendations for Outpatient Follow-up:  1. Follow up with PCP in 1-2 weeks 2. Please obtain BMP/CBC in one week  Home Health: none Equipment/Devices: none  Discharge Condition: stable CODE STATUS: Full code Diet recommendation: Dysphagia 1 with nectar thick  HPI: per Dr. Antionette Char, Emily Elliott is a 83 y.o. female with medical history significant for atrial fibrillation on warfarin, coronary artery disease, hypertension, chronic kidney disease stage III, chronic CHF, and anemia, now presenting to the emergency department with correction of her PCP for evaluation of 1 week of urinary symptoms despite taking antibiotics at home. Patient has reportedly been more confused and lethargic for the past couple days despite treatment with Cipro for a UTI. She was brought in to the PCP again today. UA reportedly demonstrated persistent bacteriuria with positive nitrites. She was directed to the ED for further evaluation of this. Patient is unable to contribute history secondary to clinical condition. Caregiver at the bedside, and son by phone, assist with the history. No recent fall or trauma. Caregiver at the bedside notes that the patient has been having difficulty swallowing, choking on foods and liquids. ED Course: Upon arrival to the ED, patient is found to be afebrile, saturating mid 80s on room air, and with vitals otherwise stable. EKG features atrial fibrillation and chest x-ray is most notable for a right pleural effusion with associated atelectasis versus infiltrate. Chemistry panels notable for a sodium of 151 and creatinine 1.59, up from a recent value of 1.29. CBC is notable for a stable normocytic anemia with hemoglobin of 10.9. BNP is elevated to 762. Troponin is within normal limits.  Lactic acid is reassuring 1.37. Head CT is negative for acute intracranial abnormality and CT of the abdomen and pelvis is most notable for a large right pleural effusion with associated atelectasis. Blood cultures were obtained in the emergency and the patient was treated with empiric Rocephin and azithromycin. Blood gas revealed respiratory acidosis and she was started on BiPAP. She remained hemodynamically stable. Patient will be admitted to the stepdown unit for ongoing evaluation and management of UTI despite outpatient antibiotics, now with acute encephalopathy and acute hypercarbic/hypoxic respiratory failure secondary to large right pleural effusion and possible aspiration.  Hospital Course: Discharge Diagnoses:  Principal Problem:   UTI (urinary tract infection) Active Problems:   Atrial fibrillation, chronic (HCC)   Essential hypertension   Hypernatremia   AKI (acute kidney injury) (HCC)   Acute encephalopathy   History of colon cancer   Pleural effusion, right   Dysphagia   CKD (chronic kidney disease), stage III   Acute respiratory failure with hypoxia and hypercapnia (HCC)   Acute CHF (congestive heart failure) (HCC)   Coagulopathy (HCC)   Acute metabolic encephalopathy -this is believed to be multifactorial due to UTI/decompensated diastolic CHF in the setting of hypoxic respiratory failure due to pleural effusion.  TSH was checked and it was normal.  B12 was checked and was elevated.  Ammonia was within normal limits.  Patient's mental status seems to be improving following the thoracentesis, and she has become more alert and appears close to baseline. Acute respiratory failure with hypoxia and hypercarbia -due to decompensated dCHF / pleural effusion / concern for CAP, she underwent 0.6 L thoracentesis on 8/7.  Her respiratory status is improved, she is currently on nasal  cannula and has not been requiring BiPAP since admission. She underwent an echocardiogram on 8/8 (full  results below). Respiratory status is stable, on minimal oxygen of 1 L nasal cannula, wean off as tolerated Concern for CAP -with worsening infiltrate on CXR, she completed treatment for 8 days with cefepime while hospitalized.  No further antibiotic needed on discharge. Right pleural effusion -Likely due to CHF, cytology with reactive mesothelial cells without evidence for malignancy.  Acute CHF, likely diastolic -She was clinically volume overloaded, started on furosemide and received IV furosemide up until 8/10, she responded to well to furosemide and dropped 20 pounds, became hypernatremic requiring gentle hydration for a day. 2D echo done shows ejection fraction of 65-70%, severe concentric hypertrophy, decreased RV systolic function and biatrial dilation.  PA peak pressure was 61.  Cardiology consulted, appreciate Dr. Jacinto Halim input.  He recommends diltiazem/metoprolol.  Would recommend patients have daily weights and use Lasix as needed Hypernatremia -Likely due to mild intravascular depletion, stable, improving.  Encourage p.o. intake, recheck BMP in 3-4 days UTI  -UA shows TNTC WBC, cultures have remained negative CKD 3 -Baseline creatinine 1.1-1.3, stable, tolerated well diuresis Chronic atrial fibrillation -CHADSVASc = 7, continue rate control with diltiazem and metoprolol per cardiology, continue Coumadin Dysphagia -Previously evaluated by speech therapy 09/26/16-->dysphagia 3 History of colon cancer status post colostomy -Had colon resection in 2014, No history of chemoradiation therapy.  Cytology in the pleural fluid is negative for malignancy Essential hypertension -Continue diltiazem and metoprolol Elevated troponin -likely demand ischemia in setting of UTI and decompensated CHF.  No chest pain.  Cardiology evaluated patient as above.    Discharge Instructions   Allergies as of 03/21/2017      Reactions   Benadryl [diphenhydramine]    rash      Medication List    STOP taking  these medications   ciprofloxacin 250 MG tablet Commonly known as:  CIPRO   losartan 100 MG tablet Commonly known as:  COZAAR     TAKE these medications   atorvastatin 10 MG tablet Commonly known as:  LIPITOR Take 10 mg by mouth every evening.   COD LIVER OIL/LOW VITAMIN A Caps Take 1 capsule by mouth daily.   COUMADIN 2 MG tablet Generic drug:  warfarin Take 2 mg by mouth every Tuesday.   warfarin 1 MG tablet Commonly known as:  COUMADIN Take 1.5 mg by mouth daily.   D-3-5 5000 units capsule Generic drug:  Cholecalciferol Take 5,000 Units by mouth daily.   diltiazem 120 MG 24 hr capsule Commonly known as:  CARDIZEM CD Take 120 mg by mouth daily.   ferrous sulfate 325 (65 FE) MG tablet Take 325 mg by mouth daily with breakfast.   hydrochlorothiazide 12.5 MG capsule Commonly known as:  MICROZIDE Take 12.5 mg by mouth daily.   LASIX 40 MG tablet Generic drug:  furosemide Take 40 mg by mouth daily as needed (edema).   Magnesium 250 MG Tabs Take 250 mg by mouth daily.   metoprolol succinate 50 MG 24 hr tablet Commonly known as:  TOPROL-XL Take 50 mg by mouth 2 (two) times daily. Take with or immediately following a meal.   OVER THE COUNTER MEDICATION Take 15 mLs by mouth daily.   vitamin B-12 500 MCG tablet Commonly known as:  CYANOCOBALAMIN Take 500 mcg by mouth daily.   vitamin C 1000 MG tablet Take 1,000 mg by mouth daily.      Contact information for after-discharge care  Destination    [facility] Follow up.   Specialty:  Skilled Nursing Facility Contact information: [address] [place] [address] [phone]             Allergies  Allergen Reactions  . Benadryl [Diphenhydramine]     rash    Consultations:    Procedures/Studies:  2D echo Impressions: - LVEF 65-70%, severe LVH, normal wall motion, mild LAE, moderate RAE, mild PR, moderate TR, RVSP 61 mmHg, dilated IVC, left  pleural effusion.  Ct Abdomen Pelvis Wo Contrast  Result Date: 03/13/2017 CLINICAL DATA:  Abdominal distention and pain. EXAM: CT ABDOMEN AND PELVIS WITHOUT CONTRAST TECHNIQUE: Multidetector CT imaging of the abdomen and pelvis was performed following the standard protocol without IV contrast. COMPARISON:  None. FINDINGS: Lower chest: Large right pleural effusion is noted with adjacent subsegmental atelectasis. Hepatobiliary: No gallstones are noted. No focal abnormality is noted in the liver. Pancreas: Unremarkable. No pancreatic ductal dilatation or surrounding inflammatory changes. Spleen: Normal in size without focal abnormality. Adrenals/Urinary Tract: Adrenal glands are unremarkable. Large left renal cysts are noted. No hydronephrosis or renal obstruction is noted. Urinary bladder is unremarkable. Stomach/Bowel: Stomach is unremarkable. Colostomy is noted in left lower quadrant. Diverticulosis is noted throughout the colon without inflammation. Vascular/Lymphatic: Aortic atherosclerosis. No enlarged abdominal or pelvic lymph nodes. Reproductive: Uterus and bilateral adnexa are unremarkable. Other: Mild amount of free fluid is noted in the pelvis. Fluid is also noted around the liver and spleen. Musculoskeletal: No acute or significant osseous findings. IMPRESSION: Large right pleural effusion with adjacent subsegmental atelectasis. Large left renal cysts. Colostomy seen in left lower quadrant. Aortic atherosclerosis. Mild ascites is noted. Electronically Signed   By: Lupita Raider, M.D.   On: 03/13/2017 20:25   Dg Chest 1 View  Result Date: 03/14/2017 CLINICAL DATA:  Status post thoracentesis EXAM: CHEST 1 VIEW COMPARISON:  Chest radiograph 03/13/2017 FINDINGS: Status post right thoracentesis with decreased amount of pleural fluid. Right basilar opacities may indicate a degree of re-expansion pulmonary edema. No pneumothorax. Unchanged small left pleural effusion and associated atelectasis. IMPRESSION:  Decreased size of right pleural effusion without post thoracentesis pneumothorax. Electronically Signed   By: Deatra Robinson M.D.   On: 03/14/2017 16:20   Dg Chest 2 View  Result Date: 03/13/2017 CLINICAL DATA:  Hypoxia, possible UTI, history hypertension, coronary artery disease, atrial fibrillation EXAM: CHEST  2 VIEW COMPARISON:  None FINDINGS: Very low lung volumes. Borderline enlargement of cardiac silhouette. Mediastinal contours normal. RIGHT pleural effusion and basilar atelectasis versus infiltrate. Minimal atelectasis at LEFT base. Mild central peribronchial thickening. No pneumothorax. Bones demineralized. IMPRESSION: Bronchitic changes with low lung volumes and mild LEFT basilar atelectasis. RIGHT pleural effusion with atelectasis versus consolidation at lower RIGHT lung. Electronically Signed   By: Ulyses Southward M.D.   On: 03/13/2017 18:15   Ct Head Wo Contrast  Result Date: 03/13/2017 CLINICAL DATA:  Altered level of consciousness. Urinary symptoms for 1 week. History of hypertension and atrial fibrillation. EXAM: CT HEAD WITHOUT CONTRAST TECHNIQUE: Contiguous axial images were obtained from the base of the skull through the vertex without intravenous contrast. COMPARISON:  None. FINDINGS: BRAIN: No intraparenchymal hemorrhage, mass effect nor midline shift. The ventricles and sulci are normal for age. Patchy supratentorial white matter hypodensities within normal range for patient's age, though non-specific are most compatible with chronic small vessel ischemic disease. Old bilateral basal ganglia and thalamus lacunar infarcts. Symmetric basal ganglia mineralization. No acute large vascular territory  infarcts. No abnormal extra-axial fluid collections. Basal cisterns are patent. VASCULAR: Moderate calcific atherosclerosis of the carotid siphons. 7 mm LEFT carotid terminus aneurysm. SKULL: No skull fracture. No significant scalp soft tissue swelling. SINUSES/ORBITS: The mastoid air-cells and  included paranasal sinuses are well-aerated.The included ocular globes and orbital contents are non-suspicious. OTHER: None. IMPRESSION: 1. No acute intracranial process. 2. Moderate chronic small vessel ischemic disease and old lacunar infarcts. 3. 7 mm LEFT carotid terminus aneurysm would be better assessed on CT or MR angiogram on nonemergent basis. Electronically Signed   By: Awilda Metro M.D.   On: 03/13/2017 20:22   Dg Chest Port 1 View  Result Date: 03/17/2017 CLINICAL DATA:  Shortness of breath. EXAM: PORTABLE CHEST 1 VIEW COMPARISON:  Radiograph of March 14, 2017. FINDINGS: Stable cardiomediastinal silhouette. No pneumothorax is noted. Stable left basilar atelectasis or edema is noted with associated pleural effusion. Increased right basilar edema, atelectasis or infiltrate is noted with associated pleural effusion. Bony thorax is unremarkable. IMPRESSION: Stable left basilar opacity as described above. Increased right basilar opacity is noted concerning for worsening edema, atelectasis or infiltrate with associated pleural effusion. Electronically Signed   By: Lupita Raider, M.D.   On: 03/17/2017 09:33   Dg Swallowing Func-speech Pathology  Result Date: 03/15/2017 Objective Swallowing Evaluation: Type of Study: MBS-Modified Barium Swallow Study Patient Details Name: Aleksia Freiman MRN: [id number] Date of Birth: 18-Sep-1920 Today's Date: 03/15/2017 Time: SLP Start Time (ACUTE ONLY): 0900-SLP Stop Time (ACUTE ONLY): 0919 SLP Time Calculation (min) (ACUTE ONLY): 19 min Past Medical History: Past Medical History: Diagnosis Date . Atrial fibrillation (HCC)  . Coronary artery disease  . Hypertension  Past Surgical History: Past Surgical History: Procedure Laterality Date . COLOSTOMY   HPI: 83 yo female adm [facility] with weakness, confusion, somnolence - pt diagnosed with Urinary Tract Infection.  PMH + for old lacunar CVAs - bilateral basal ganglia, thalamic CVA.  Per daughter Colvin Caroli  - pt has been  coughing with intake at home and reports issues with "feeling full" pointing to proximal esophagus.  Pt denies this being a significant issue.  She has resided with daughter for the last 18 months.  Pt with ? ascities per imaging study.  CXR showed large right pleural effusion - diagnosed with respiratory acidosis - for thoracentesis today.   Subjective: pt awake in bed, desires liquid intake Assessment / Plan / Recommendation CHL IP CLINICAL IMPRESSIONS 03/15/2017 Clinical Impression Pt with mild oropharyngeal dysphagia with delayed oral transiting/coordination due to lingual discoordination.  Pharyngeal swallow is timely without aspiration/penetration nor significant residuals.  Minimal pharyngeal residuals presents which pt does not consistently sense but CUED dry swallows helpful to decrease.  Pt does take large boluses - most notably liquids - suspect more of self feeding deficit.  Recommend dys1/thin with strict precautions. Using live video, educated pt and daughter to findings/recommendations.  Spoke to MD and obtained diet order and posted signs in pt's room.  Of note, pt does become fatigued easily and may benefit from nutritional supplements.  SLP Visit Diagnosis Dysphagia, oropharyngeal phase (R13.12);Dysphagia, pharyngoesophageal phase (R13.14) Attention and concentration deficit following -- Frontal lobe and executive function deficit following -- Impact on safety and function Mild aspiration risk;Risk for inadequate nutrition/hydration   CHL IP TREATMENT RECOMMENDATION 03/15/2017 Treatment Recommendations F/U MBS in --- days (Comment)   Prognosis 03/15/2017 Prognosis for Safe Diet Advancement Fair Barriers to Reach Goals -- Barriers/Prognosis Comment -- CHL IP DIET RECOMMENDATION 03/15/2017 SLP Diet Recommendations Dysphagia 1 (  Puree) solids;Thin liquid Liquid Administration via Cup;Straw Medication Administration Whole meds with puree Compensations Slow rate;Small sips/bites Postural Changes --   CHL IP  OTHER RECOMMENDATIONS 03/15/2017 Recommended Consults -- Oral Care Recommendations -- Other Recommendations Order thickener from pharmacy   CHL IP FOLLOW UP RECOMMENDATIONS 03/15/2017 Follow up Recommendations (No Data)   CHL IP FREQUENCY AND DURATION 03/15/2017 Speech Therapy Frequency (ACUTE ONLY) min 2x/week Treatment Duration 1 week      CHL IP ORAL PHASE 03/15/2017 Oral Phase Impaired Oral - Pudding Teaspoon -- Oral - Pudding Cup -- Oral - Honey Teaspoon -- Oral - Honey Cup -- Oral - Nectar Teaspoon -- Oral - Nectar Cup Premature spillage;Weak lingual manipulation Oral - Nectar Straw -- Oral - Thin Teaspoon Weak lingual manipulation;Premature spillage Oral - Thin Cup Weak lingual manipulation;Premature spillage Oral - Thin Straw Premature spillage;Weak lingual manipulation Oral - Puree Premature spillage;Weak lingual manipulation;Decreased bolus cohesion Oral - Mech Soft Weak lingual manipulation;Premature spillage;Decreased bolus cohesion Oral - Regular -- Oral - Multi-Consistency -- Oral - Pill Weak lingual manipulation;Premature spillage Oral Phase - Comment --  CHL IP PHARYNGEAL PHASE 03/15/2017 Pharyngeal Phase Impaired Pharyngeal- Pudding Teaspoon -- Pharyngeal -- Pharyngeal- Pudding Cup -- Pharyngeal -- Pharyngeal- Honey Teaspoon -- Pharyngeal -- Pharyngeal- Honey Cup -- Pharyngeal -- Pharyngeal- Nectar Teaspoon WFL Pharyngeal -- Pharyngeal- Nectar Cup Pharyngeal residue - valleculae Pharyngeal -- Pharyngeal- Nectar Straw -- Pharyngeal -- Pharyngeal- Thin Teaspoon WFL Pharyngeal -- Pharyngeal- Thin Cup WFL;Pharyngeal residue - valleculae Pharyngeal -- Pharyngeal- Thin Straw WFL;Pharyngeal residue - valleculae Pharyngeal -- Pharyngeal- Puree Delayed swallow initiation-vallecula Pharyngeal -- Pharyngeal- Mechanical Soft Delayed swallow initiation-vallecula Pharyngeal -- Pharyngeal- Regular -- Pharyngeal -- Pharyngeal- Multi-consistency -- Pharyngeal -- Pharyngeal- Pill WFL Pharyngeal -- Pharyngeal Comment --  CHL IP  CERVICAL ESOPHAGEAL PHASE 03/15/2017 Cervical Esophageal Phase Impaired Pudding Teaspoon -- Pudding Cup -- Honey Teaspoon -- Honey Cup -- Nectar Teaspoon -- Nectar Cup -- Nectar Straw -- Thin Teaspoon -- Thin Cup -- Thin Straw -- Puree -- Mechanical Soft -- Regular -- Multi-consistency -- Pill -- Cervical Esophageal Comment appearance of adequate clearance of barium = radiologist not present to confirm No flowsheet data found. [name], Tamara [name] 03/15/2017, 11:33 AM  Donavan Burnetamara Kimball, MS [facility] SLP [phone]             [place] Thoracentesis Asp Pleural Space W/img Guide  Result Date: 03/14/2017 INDICATION: Decompensated heart failure with a right-sided pleural effusion. Request is made for diagnostic and therapeutic thoracentesis. EXAM: ULTRASOUND GUIDED DIAGNOSTIC AND THERAPEUTIC THORACENTESIS MEDICATIONS: 1% lidocaine COMPLICATIONS: None immediate. PROCEDURE: An ultrasound guided thoracentesis was thoroughly discussed with the patient's son and questions answered. The benefits, risks, alternatives and complications were also discussed. The patient's son understands and wishes to proceed with the procedure. Telephone consent was obtained. Ultrasound was performed to localize and mark an adequate pocket of fluid in the right chest. The area was then prepped and draped in the normal sterile fashion. 1% Lidocaine was used for local anesthesia. Under ultrasound guidance a Safe-T-Centesis catheter was introduced. Thoracentesis was performed. The catheter was removed and a dressing applied. FINDINGS: A total of approximately 0.6 L of serous fluid was removed. Samples were sent to the laboratory as requested by the clinical team. IMPRESSION: Successful ultrasound guided right thoracentesis yielding 0.6 L of pleural fluid. Read by: Barnetta ChapelKelly Osborne, PA-C Electronically Signed   By: Corlis Leak  Hassell M.D.   On: 03/14/2017 16:20      Subjective: - no chest pain, shortness of breath, no abdominal pain,  nausea or vomiting.   Discharge  Exam: Vitals:   03/20/17 2106 03/21/17 0530  BP: 108/62 116/74  Pulse: 85 83  Resp: 18 20  Temp: 98.7 F (37.1 C) 98.3 F (36.8 C)  SpO2: 100% 100%   Vitals:   03/20/17 1416 03/20/17 1439 03/20/17 2106 03/21/17 0530  BP: 111/67  108/62 116/74  Pulse: 94  85 83  Resp:   18 20  Temp: 97.7 F (36.5 C)  98.7 F (37.1 C) 98.3 F (36.8 C)  TempSrc: Axillary  Oral Oral  SpO2: (!) 82% 100% 100% 100%  Weight:    71.1 kg (156 lb 12 oz)  Height:        General: Pt is alert, awake, not in acute distress Cardiovascular: RRR, S1/S2 +, no rubs, no gallops Respiratory: CTA bilaterally, no wheezing, no rhonchi Abdominal: Soft, NT, ND, bowel sounds + Extremities: no edema, no cyanosis    The results of significant diagnostics from this hospitalization (including imaging, microbiology, ancillary and laboratory) are listed below for reference.     Microbiology: Recent Results (from the past 240 hour(s))  Blood culture (routine x 2)     Status: None   Collection Time: 03/13/17  6:40 PM  Result Value Ref Range Status   Specimen Description BLOOD LEFT FOREARM  Final   Special Requests   Final    BOTTLES DRAWN AEROBIC AND ANAEROBIC Blood Culture adequate volume   Culture   Final    NO GROWTH 5 DAYS Performed at [facility], [address] [place], [place] [address]    Report Status 03/18/2017 FINAL  Final  Blood culture (routine x 2)     Status: None   Collection Time: 03/13/17  8:58 PM  Result Value Ref Range Status   Specimen Description BLOOD LEFT HAND  Final   Special Requests IN PEDIATRIC BOTTLE Blood Culture adequate volume  Final   Culture   Final    NO GROWTH 5 DAYS Performed at [facility], [address], [place], [place] [address]    Report Status 03/19/2017 FINAL  Final  Urine culture     Status: Abnormal   Collection Time: 03/13/17 10:41 PM  Result Value Ref Range Status   Specimen Description URINE, CLEAN CATCH  Final   Special Requests Normal   Final   Culture (A)  Final    <10,000 COLONIES/mL INSIGNIFICANT GROWTH Performed at [facility], [address], [place], [place] [address]    Report Status 03/15/2017 FINAL  Final  MRSA PCR Screening     Status: None   Collection Time: 03/13/17 11:44 PM  Result Value Ref Range Status   MRSA by PCR NEGATIVE NEGATIVE Final    Comment:        The GeneXpert MRSA Assay (FDA approved for NASAL specimens only), is one component of a comprehensive MRSA colonization surveillance program. It is not intended to diagnose MRSA infection nor to guide or monitor treatment for MRSA infections.   Gram stain     Status: None   Collection Time: 03/14/17  4:04 PM  Result Value Ref Range Status   Specimen Description PLEURAL RIGHT  Final   Special Requests BOTTLES DRAWN AEROBIC AND ANAEROBIC  Final   Gram Stain   Final    WBC PRESENT, PREDOMINANTLY PMN NO ORGANISMS SEEN CYTOSPIN SMEAR Performed at [facility], [address], [place], [place] [address]    Report Status 03/14/2017 FINAL  Final  Culture, body fluid-bottle     Status: None   Collection Time: 03/14/17  4:04 PM  Result Value Ref Range Status   Specimen Description PLEURAL RIGHT  Final   Special Requests BOTTLES DRAWN AEROBIC AND ANAEROBIC  Final   Culture   Final    NO GROWTH 5 DAYS Performed at [facility], [address], [place], [place] [address]    Report Status 03/19/2017 FINAL  Final     Labs: BNP (last 3 results)  Recent Labs  03/13/17 1835  BNP 761.6*   Basic Metabolic Panel:  Recent Labs Lab 03/15/17 0311  03/17/17 0437 03/18/17 0530 03/19/17 0915 03/20/17 0454 03/21/17 0425  NA 152*  < > 151* 153* 156* 153* 151*  K 3.6  < > 3.4* 4.5 4.4 4.5 4.5  CL 108  < > 100* 104 106 108 106  CO2 37*  < > 45* 44* 44* 39* 41*  GLUCOSE 80  < > 103* 99 103* 102* 100*  BUN 40*  < > 38* 45* 43* 42* 41*  CREATININE 1.47*  < > 1.17* 1.21* 1.24* 1.18* 1.13*  CALCIUM 8.1*  < > 8.2* 8.2* 8.3* 7.9*  8.0*  MG 2.7*  --   --   --   --   --   --   < > = values in this interval not displayed. Liver Function Tests: No results for input(s): AST, ALT, ALKPHOS, BILITOT, PROT, ALBUMIN in the last 168 hours. No results for input(s): LIPASE, AMYLASE in the last 168 hours. No results for input(s): AMMONIA in the last 168 hours. CBC:  Recent Labs Lab 03/16/17 0303  WBC 7.4  HGB 10.0*  HCT 34.8*  MCV 94.6  PLT 237   Cardiac Enzymes: No results for input(s): CKTOTAL, CKMB, CKMBINDEX, TROPONINI in the last 168 hours. BNP: Invalid input(s): POCBNP CBG: No results for input(s): GLUCAP in the last 168 hours. D-Dimer No results for input(s): DDIMER in the last 72 hours. Hgb A1c No results for input(s): HGBA1C in the last 72 hours. Lipid Profile No results for input(s): CHOL, HDL, LDLCALC, TRIG, CHOLHDL, LDLDIRECT in the last 72 hours. Thyroid function studies No results for input(s): TSH, T4TOTAL, T3FREE, THYROIDAB in the last 72 hours.  Invalid input(s): FREET3 Anemia work up No results for input(s): VITAMINB12, FOLATE, FERRITIN, TIBC, IRON, RETICCTPCT in the last 72 hours. Urinalysis    Component Value Date/Time   COLORURINE YELLOW 03/13/2017 2241   APPEARANCEUR HAZY (A) 03/13/2017 2241   LABSPEC 1.016 03/13/2017 2241   PHURINE 6.0 03/13/2017 2241   GLUCOSEU NEGATIVE 03/13/2017 2241   HGBUR NEGATIVE 03/13/2017 2241   BILIRUBINUR NEGATIVE 03/13/2017 2241   KETONESUR NEGATIVE 03/13/2017 2241   PROTEINUR 100 (A) 03/13/2017 2241   NITRITE NEGATIVE 03/13/2017 2241   LEUKOCYTESUR LARGE (A) 03/13/2017 2241   Sepsis Labs Invalid input(s): PROCALCITONIN,  WBC,  LACTICIDVEN Microbiology Recent Results (from the past 240 hour(s))  Blood culture (routine x 2)     Status: None   Collection Time: 03/13/17  6:40 PM  Result Value Ref Range Status   Specimen Description BLOOD LEFT FOREARM  Final   Special Requests   Final    BOTTLES DRAWN AEROBIC AND ANAEROBIC Blood Culture adequate  volume   Culture   Final    NO GROWTH 5 DAYS Performed at [facility], [address], [place], [place] [address]    Report Status 03/18/2017 FINAL  Final  Blood culture (routine x 2)  Status: None   Collection Time: 03/13/17  8:58 PM  Result Value Ref Range Status   Specimen Description BLOOD LEFT HAND  Final   Special Requests IN PEDIATRIC BOTTLE Blood Culture adequate volume  Final   Culture   Final    NO GROWTH 5 DAYS Performed at [facility], [address], [place], [place] [address]    Report Status 03/19/2017 FINAL  Final  Urine culture     Status: Abnormal   Collection Time: 03/13/17 10:41 PM  Result Value Ref Range Status   Specimen Description URINE, CLEAN CATCH  Final   Special Requests Normal  Final   Culture (A)  Final    <10,000 COLONIES/mL INSIGNIFICANT GROWTH Performed at [facility], [address] [place], [place] [address]    Report Status 03/15/2017 FINAL  Final  MRSA PCR Screening     Status: None   Collection Time: 03/13/17 11:44 PM  Result Value Ref Range Status   MRSA by PCR NEGATIVE NEGATIVE Final    Comment:        The GeneXpert MRSA Assay (FDA approved for NASAL specimens only), is one component of a comprehensive MRSA colonization surveillance program. It is not intended to diagnose MRSA infection nor to guide or monitor treatment for MRSA infections.   Gram stain     Status: None   Collection Time: 03/14/17  4:04 PM  Result Value Ref Range Status   Specimen Description PLEURAL RIGHT  Final   Special Requests BOTTLES DRAWN AEROBIC AND ANAEROBIC  Final   Gram Stain   Final    WBC PRESENT, PREDOMINANTLY PMN NO ORGANISMS SEEN CYTOSPIN SMEAR Performed at [facility], [address], [place], [place] [address]    Report Status 03/14/2017 FINAL  Final  Culture, body fluid-bottle     Status: None   Collection Time: 03/14/17  4:04 PM  Result Value Ref Range Status   Specimen Description PLEURAL RIGHT  Final     Special Requests BOTTLES DRAWN AEROBIC AND ANAEROBIC  Final   Culture   Final    NO GROWTH 5 DAYS Performed at [facility], [address] [place], [place] [address]    Report Status 03/19/2017 FINAL  Final     Time coordinating discharge: 45 minutes  SIGNED:  Pamella Pert, MD  [facility] 03/21/2017, 10:52 AM Pager [phone]  If 7PM-7AM, please contact night-coverage [email or web address] Password [email or web address]

## 2017-03-21 NOTE — Progress Notes (Signed)
[facility].  Transportation already arranged by SW with [facility].  Awaiting transportation.  Pt. Ready for d/c.

## 2017-03-21 NOTE — Progress Notes (Signed)
Spoke with pt's daughter, she has visited and completed admission paperwork at [facility]. CSW selected facility in the HUB and confirmed with admissions.   DC plan: [facility].   Ilean SkillMeghan Sebert Stollings, MSW, LCSW Clinical Social Work 03/21/2017 [phone]

## 2017-03-26 ENCOUNTER — Encounter (HOSPITAL_COMMUNITY): Payer: Self-pay

## 2017-03-26 ENCOUNTER — Emergency Department (HOSPITAL_COMMUNITY): Payer: Medicare Other

## 2017-03-26 ENCOUNTER — Inpatient Hospital Stay (HOSPITAL_COMMUNITY)
Admission: EM | Admit: 2017-03-26 | Discharge: 2017-03-31 | DRG: 291 | Disposition: A | Discharge status: Skilled Nursing Facility | Payer: Medicare Other | Attending: Internal Medicine | Admitting: Internal Medicine

## 2017-03-26 DIAGNOSIS — N183 Chronic kidney disease, stage 3 unspecified: Secondary | ICD-10-CM | POA: Diagnosis present

## 2017-03-26 DIAGNOSIS — J90 Pleural effusion, not elsewhere classified: Secondary | ICD-10-CM | POA: Diagnosis present

## 2017-03-26 DIAGNOSIS — Z515 Encounter for palliative care: Secondary | ICD-10-CM | POA: Diagnosis not present

## 2017-03-26 DIAGNOSIS — E869 Volume depletion, unspecified: Secondary | ICD-10-CM | POA: Diagnosis present

## 2017-03-26 DIAGNOSIS — Z6825 Body mass index (BMI) 25.0-25.9, adult: Secondary | ICD-10-CM | POA: Diagnosis not present

## 2017-03-26 DIAGNOSIS — R0602 Shortness of breath: Secondary | ICD-10-CM

## 2017-03-26 DIAGNOSIS — Z85038 Personal history of other malignant neoplasm of large intestine: Secondary | ICD-10-CM

## 2017-03-26 DIAGNOSIS — I482 Chronic atrial fibrillation, unspecified: Secondary | ICD-10-CM | POA: Diagnosis present

## 2017-03-26 DIAGNOSIS — J441 Chronic obstructive pulmonary disease with (acute) exacerbation: Secondary | ICD-10-CM | POA: Diagnosis present

## 2017-03-26 DIAGNOSIS — I272 Pulmonary hypertension, unspecified: Secondary | ICD-10-CM | POA: Diagnosis present

## 2017-03-26 DIAGNOSIS — I5033 Acute on chronic diastolic (congestive) heart failure: Secondary | ICD-10-CM

## 2017-03-26 DIAGNOSIS — Z933 Colostomy status: Secondary | ICD-10-CM | POA: Diagnosis not present

## 2017-03-26 DIAGNOSIS — I130 Hypertensive heart and chronic kidney disease with heart failure and stage 1 through stage 4 chronic kidney disease, or unspecified chronic kidney disease: Principal | ICD-10-CM | POA: Diagnosis present

## 2017-03-26 DIAGNOSIS — J9602 Acute respiratory failure with hypercapnia: Secondary | ICD-10-CM | POA: Diagnosis present

## 2017-03-26 DIAGNOSIS — E870 Hyperosmolality and hypernatremia: Secondary | ICD-10-CM | POA: Diagnosis present

## 2017-03-26 DIAGNOSIS — G9340 Encephalopathy, unspecified: Secondary | ICD-10-CM | POA: Diagnosis present

## 2017-03-26 DIAGNOSIS — Z888 Allergy status to other drugs, medicaments and biological substances status: Secondary | ICD-10-CM

## 2017-03-26 DIAGNOSIS — Z66 Do not resuscitate: Secondary | ICD-10-CM | POA: Diagnosis present

## 2017-03-26 DIAGNOSIS — Z7189 Other specified counseling: Secondary | ICD-10-CM

## 2017-03-26 DIAGNOSIS — R0600 Dyspnea, unspecified: Secondary | ICD-10-CM

## 2017-03-26 DIAGNOSIS — I10 Essential (primary) hypertension: Secondary | ICD-10-CM | POA: Diagnosis present

## 2017-03-26 DIAGNOSIS — R627 Adult failure to thrive: Secondary | ICD-10-CM | POA: Diagnosis present

## 2017-03-26 DIAGNOSIS — Z7901 Long term (current) use of anticoagulants: Secondary | ICD-10-CM

## 2017-03-26 DIAGNOSIS — I2510 Atherosclerotic heart disease of native coronary artery without angina pectoris: Secondary | ICD-10-CM | POA: Diagnosis present

## 2017-03-26 DIAGNOSIS — J9690 Respiratory failure, unspecified, unspecified whether with hypoxia or hypercapnia: Secondary | ICD-10-CM | POA: Diagnosis present

## 2017-03-26 DIAGNOSIS — Z9889 Other specified postprocedural states: Secondary | ICD-10-CM

## 2017-03-26 DIAGNOSIS — E872 Acidosis: Secondary | ICD-10-CM | POA: Diagnosis present

## 2017-03-26 DIAGNOSIS — J9601 Acute respiratory failure with hypoxia: Secondary | ICD-10-CM | POA: Diagnosis present

## 2017-03-26 DIAGNOSIS — I4821 Permanent atrial fibrillation: Secondary | ICD-10-CM

## 2017-03-26 LAB — COMPREHENSIVE METABOLIC PANEL
ALBUMIN: 3.2 g/dL — AB (ref 3.5–5.0)
ALK PHOS: 62 U/L (ref 38–126)
ALT: 19 U/L (ref 14–54)
AST: 27 U/L (ref 15–41)
Anion gap: 6 (ref 5–15)
BILIRUBIN TOTAL: 0.9 mg/dL (ref 0.3–1.2)
BUN: 36 mg/dL — AB (ref 6–20)
CO2: 37 mmol/L — ABNORMAL HIGH (ref 22–32)
Calcium: 8.2 mg/dL — ABNORMAL LOW (ref 8.9–10.3)
Chloride: 103 mmol/L (ref 101–111)
Creatinine, Ser: 1.1 mg/dL — ABNORMAL HIGH (ref 0.44–1.00)
GFR calc Af Amer: 49 mL/min — ABNORMAL LOW (ref >60)
GFR calc non Af Amer: 42 mL/min — ABNORMAL LOW (ref >60)
GLUCOSE: 117 mg/dL — AB (ref 65–99)
POTASSIUM: 4.9 mmol/L (ref 3.5–5.1)
Sodium: 146 mmol/L — ABNORMAL HIGH (ref 135–145)
TOTAL PROTEIN: 7 g/dL (ref 6.5–8.1)

## 2017-03-26 LAB — CBC WITH DIFFERENTIAL/PLATELET
Basophils Absolute: 0 10*3/uL (ref 0.0–0.1)
Basophils Relative: 0 %
Eosinophils Absolute: 0.1 10*3/uL (ref 0.0–0.7)
Eosinophils Relative: 2 %
HEMATOCRIT: 33.2 % — AB (ref 36.0–46.0)
HEMOGLOBIN: 9.5 g/dL — AB (ref 12.0–15.0)
LYMPHS ABS: 0.8 10*3/uL (ref 0.7–4.0)
LYMPHS PCT: 11 %
MCH: 28.2 pg (ref 26.0–34.0)
MCHC: 28.6 g/dL — ABNORMAL LOW (ref 30.0–36.0)
MCV: 98.5 fL (ref 78.0–100.0)
Monocytes Absolute: 0.3 10*3/uL (ref 0.1–1.0)
Monocytes Relative: 4 %
NEUTROS PCT: 83 %
Neutro Abs: 6.2 10*3/uL (ref 1.7–7.7)
Platelets: 278 10*3/uL (ref 150–400)
RBC: 3.37 MIL/uL — AB (ref 3.87–5.11)
RDW: 17.1 % — ABNORMAL HIGH (ref 11.5–15.5)
WBC: 7.4 10*3/uL (ref 4.0–10.5)

## 2017-03-26 LAB — BLOOD GAS, ARTERIAL
ACID-BASE EXCESS: 11.2 mmol/L — AB (ref 0.0–2.0)
Acid-Base Excess: 9.9 mmol/L — ABNORMAL HIGH (ref 0.0–2.0)
BICARBONATE: 39.3 mmol/L — AB (ref 20.0–28.0)
Bicarbonate: 38.5 mmol/L — ABNORMAL HIGH (ref 20.0–28.0)
Delivery systems: POSITIVE
Drawn by: 331471
Drawn by: 3314741
Expiratory PAP: 5
FIO2: 50
Inspiratory PAP: 10
O2 Content: 3 L/min
O2 SAT: 95.6 %
O2 Saturation: 92.7 %
PATIENT TEMPERATURE: 98.6
PATIENT TEMPERATURE: 98.6
PCO2 ART: 80.5 mmHg — AB (ref 32.0–48.0)
PH ART: 7.31 — AB (ref 7.350–7.450)
PO2 ART: 75.4 mmHg — AB (ref 83.0–108.0)
PO2 ART: 85.4 mmHg (ref 83.0–108.0)
pCO2 arterial: 85.6 mmHg (ref 32.0–48.0)
pH, Arterial: 7.276 — ABNORMAL LOW (ref 7.350–7.450)

## 2017-03-26 LAB — URINALYSIS, ROUTINE W REFLEX MICROSCOPIC
Bilirubin Urine: NEGATIVE
GLUCOSE, UA: NEGATIVE mg/dL
Hgb urine dipstick: NEGATIVE
KETONES UR: NEGATIVE mg/dL
LEUKOCYTES UA: NEGATIVE
NITRITE: NEGATIVE
PH: 5 (ref 5.0–8.0)
PROTEIN: NEGATIVE mg/dL
Specific Gravity, Urine: 1.009 (ref 1.005–1.030)

## 2017-03-26 LAB — PROTIME-INR
INR: 1.9
Prothrombin Time: 22.1 seconds — ABNORMAL HIGH (ref 11.4–15.2)

## 2017-03-26 LAB — TROPONIN I: TROPONIN I: 0.04 ng/mL — AB (ref <0.03)

## 2017-03-26 LAB — MRSA PCR SCREENING: MRSA BY PCR: NEGATIVE

## 2017-03-26 LAB — BRAIN NATRIURETIC PEPTIDE: B Natriuretic Peptide: 828.6 pg/mL — ABNORMAL HIGH (ref 0.0–100.0)

## 2017-03-26 MED ORDER — HEPARIN SODIUM (PORCINE) 5000 UNIT/ML IJ SOLN: 5000.0000 [IU] | Freq: Two times a day (BID) | INTRAMUSCULAR | Status: DC

## 2017-03-26 MED ORDER — WARFARIN - PHYSICIAN DOSING INPATIENT: Freq: Every day | Status: DC

## 2017-03-26 MED ORDER — FUROSEMIDE 10 MG/ML IJ SOLN
60.0000 mg | Freq: Once | INTRAMUSCULAR | Status: AC
  Administered 2017-03-26: 60 mg via INTRAVENOUS
  Filled 2017-03-26: qty 8

## 2017-03-26 MED ORDER — WARFARIN SODIUM 2 MG PO TABS: 2.0000 mg | ORAL_TABLET | ORAL | Status: DC

## 2017-03-26 MED ORDER — WARFARIN SODIUM 1 MG PO TABS: 1.5000 mg | ORAL_TABLET | Freq: Every day | ORAL | Status: DC

## 2017-03-26 MED ORDER — POLYVINYL ALCOHOL 1.4 % OP SOLN
2.0000 [drp] | Freq: Three times a day (TID) | OPHTHALMIC | Status: DC | PRN
  Administered 2017-03-26: 2 [drp] via OPHTHALMIC
  Filled 2017-03-26 (×2): qty 15

## 2017-03-26 MED ORDER — DILTIAZEM HCL ER 120 MG PO CP24
120.0000 mg | ORAL_CAPSULE | Freq: Every day | ORAL | Status: DC
  Filled 2017-03-26: qty 1

## 2017-03-26 MED ORDER — CHLORHEXIDINE GLUCONATE 0.12 % MT SOLN
15.0000 mL | Freq: Two times a day (BID) | OROMUCOSAL | Status: DC
  Administered 2017-03-26 – 2017-03-31 (×9): 15 mL via OROMUCOSAL
  Filled 2017-03-26 (×10): qty 15

## 2017-03-26 MED ORDER — FUROSEMIDE 10 MG/ML IJ SOLN: 40.0000 mg | Freq: Two times a day (BID) | INTRAMUSCULAR | Status: DC

## 2017-03-26 MED ORDER — WARFARIN SODIUM 1 MG PO TABS
1.5000 mg | ORAL_TABLET | ORAL | Status: DC
  Filled 2017-03-26: qty 1

## 2017-03-26 MED ORDER — METOPROLOL SUCCINATE ER 25 MG PO TB24: 50.0000 mg | ORAL_TABLET | Freq: Two times a day (BID) | ORAL | Status: DC

## 2017-03-26 MED ORDER — ORAL CARE MOUTH RINSE
15.0000 mL | Freq: Two times a day (BID) | OROMUCOSAL | Status: DC
  Administered 2017-03-27 – 2017-03-31 (×9): 15 mL via OROMUCOSAL

## 2017-03-26 MED ORDER — ATORVASTATIN CALCIUM 10 MG PO TABS
10.0000 mg | ORAL_TABLET | Freq: Every day | ORAL | Status: DC
  Filled 2017-03-26: qty 1

## 2017-03-26 NOTE — Progress Notes (Signed)
Rt called to room WA19. Pt brough in by EMS. Pt curremtly on 100% NRB sats 100%. Rt placed pt on 3 LPM [place] sats 100%. No distress noted at this time. ABG pending.

## 2017-03-26 NOTE — ED Notes (Signed)
At this time I change her FIO2 to 50% per v.o. Dr. Particia Nearing.

## 2017-03-26 NOTE — ED Provider Notes (Signed)
[facility]-EMERGENCY DEPT Provider Note   CSN: [id number] Arrival date & time: 03/26/17  1027     History   Chief Complaint Chief Complaint  Patient presents with  . Shortness of Breath    HPI Emily Elliott is a 83 y.o. female.  Pt presents to the ED today with sob.  The pt was admitted here from 8/6 to 8/14 for respiratory distress.  She had a large right pleural effusion that was drained.  After d/c, she went to rehab and has been home for a few days. The pt's daughter said sob started today.  RA O2 sat in the 60s, so EMS put her on a 100% nrb.  Pt has a hx of a.fib and is on coumadin.  CHA2DS2/VAS Stroke Risk Points      5 >= 2 Points: High Risk  1 - 1.99 Points: Medium Risk  0 Points: Low Risk    The previous score was 4 on 03/13/2017.:  Change:         Details    Note: External data might be a factor in metrics not marked with    Points Metrics   This score determines the patient's risk of having a stroke if the  patient has atrial fibrillation.       1 Has Congestive Heart Failure:  Yes   0 Has Vascular Disease:  No   1 Has Hypertension:  Yes   2 Age:  88   0 Has Diabetes:  No   0 Had Stroke:  No Had TIA:  No Had thromboembolism:  No   1 Female:  Yes             Past Medical History:  Diagnosis Date  . Atrial fibrillation (HCC)   . Hypertension     Patient Active Problem List   Diagnosis Date Noted  . Acute CHF (congestive heart failure) (HCC) 03/14/2017  . Coagulopathy (HCC) 03/14/2017  . Atrial fibrillation, chronic (HCC) 03/13/2017  . Essential hypertension 03/13/2017  . Hypernatremia 03/13/2017  . AKI (acute kidney injury) (HCC) 03/13/2017  . Acute encephalopathy 03/13/2017  . UTI (urinary tract infection) 03/13/2017  . History of colon cancer 03/13/2017  . Pleural effusion, right 03/13/2017  . Dysphagia 03/13/2017  . CKD (chronic kidney disease), stage III 03/13/2017  . Acute respiratory failure with hypoxia and hypercapnia (HCC)   . Foot pain,  bilateral 05/10/2016    Past Surgical History:  Procedure Laterality Date  . COLOSTOMY      OB History    No data available       Home Medications    Prior to Admission medications   Medication Sig Start Date End Date Taking? Authorizing Provider  Ascorbic Acid (VITAMIN C) 1000 MG tablet Take 1,000 mg by mouth daily.   Yes [provider]  atorvastatin (LIPITOR) 10 MG tablet Take 10 mg by mouth at bedtime.    Yes [provider]  Cholecalciferol (VITAMIN D3) 5000 units CAPS Take 5,000 Units by mouth daily.   Yes [provider]  COD LIVER OIL/LOW VITAMIN A CAPS Take 1 capsule by mouth daily.   Yes [provider]  cyanocobalamin 500 MCG tablet Take 500 mcg by mouth daily.   Yes [provider]  diltiazem (DILACOR XR) 120 MG 24 hr capsule Take 120 mg by mouth daily.   Yes [provider]  ferrous sulfate 324 (65 Fe) MG TBEC Take 1 tablet by mouth daily.   Yes [provider]  furosemide (LASIX) 40 MG tablet Take 40 mg by mouth daily as needed for edema.    Yes [provider]  hydrochlorothiazide (MICROZIDE) 12.5 MG capsule Take 12.5 mg by mouth daily.   Yes [provider]  Magnesium 250 MG TABS Take 250 mg by mouth daily.   Yes [provider]  metoprolol succinate (TOPROL-XL) 50 MG 24 hr tablet Take 50 mg by mouth 2 (two) times daily. Take with or immediately following a meal.   Yes [provider]  Nutritional Supplements (NUTRITIONAL SUPPLEMENT PLUS) LIQD Take 15 mLs by mouth daily.   Yes [provider]  warfarin (COUMADIN) 1 MG tablet Take 1.5 mg by mouth at bedtime. Pt takes this dose every day but Tuesday.   Yes [provider]  warfarin (COUMADIN) 2 MG tablet Take 2 mg by mouth every Tuesday.    Yes [provider]    Family History No family history on file.  Social History Social History  Substance Use Topics  . Smoking status: Never Smoker    . Smokeless tobacco: Never Used  . Alcohol use No     Allergies   Benadryl [diphenhydramine]   Review of Systems Review of Systems  Respiratory: Positive for shortness of breath.   All other systems reviewed and are negative.    Physical Exam Updated Vital Signs BP (!) 152/94   Pulse 92   Temp 99.5 F (37.5 C) (Rectal)   Resp (!) 24   SpO2 93%   Physical Exam  Constitutional: She appears well-developed. She appears distressed.  HENT:  Head: Normocephalic and atraumatic.  Right Ear: External ear normal.  Left Ear: External ear normal.  Nose: Nose normal.  Mouth/Throat: Oropharynx is clear and moist.  Eyes: Pupils are equal, round, and reactive to light. Conjunctivae and EOM are normal.  Neck: Normal range of motion. Neck supple.  Cardiovascular: Normal rate, normal heart sounds and intact distal pulses.  An irregularly irregular rhythm present.  Pulmonary/Chest: Accessory muscle usage present. She is in respiratory distress.  Abdominal: Soft. Bowel sounds are normal.  Musculoskeletal: Normal range of motion.  Neurological: She is alert.  Skin: Skin is warm.  Psychiatric: She has a normal mood and affect. Her behavior is normal. Judgment and thought content normal.  Nursing note and vitals reviewed.    ED Treatments / Results  Labs (all labs ordered are listed, but only abnormal results are displayed) Labs Reviewed  CBC WITH DIFFERENTIAL/PLATELET - Abnormal; Notable for the following:       Result Value   RBC 3.37 (*)    Hemoglobin 9.5 (*)    HCT 33.2 (*)    MCHC 28.6 (*)    RDW 17.1 (*)    All other components within normal limits  TROPONIN I - Abnormal; Notable for the following:    Troponin I 0.04 (*)    All other components within normal limits  BRAIN NATRIURETIC PEPTIDE - Abnormal; Notable for the following:    B Natriuretic Peptide 828.6 (*)    All other components within normal limits  COMPREHENSIVE METABOLIC PANEL - Abnormal; Notable for the  following:    Sodium 146 (*)    CO2 37 (*)    Glucose, Bld 117 (*)    BUN 36 (*)    Creatinine, Ser 1.10 (*)    Calcium 8.2 (*)    Albumin 3.2 (*)    GFR calc non Af Amer 42 (*)    GFR calc Af Amer 49 (*)  All other components within normal limits  PROTIME-INR - Abnormal; Notable for the following:    Prothrombin Time 22.1 (*)    All other components within normal limits  BLOOD GAS, ARTERIAL - Abnormal; Notable for the following:    pH, Arterial 7.276 (*)    pCO2 arterial 85.6 (*)    pO2, Arterial 75.4 (*)    Bicarbonate 38.5 (*)    Acid-Base Excess 9.9 (*)    All other components within normal limits  URINALYSIS, ROUTINE W REFLEX MICROSCOPIC    EKG  EKG Interpretation  Date/Time:  Sunday March 26 2017 10:43:04 EDT Ventricular Rate:  90 PR Interval:    QRS Duration: 86 QT Interval:  357 QTC Calculation: 437 R Axis:   -6 Text Interpretation:  Atrial fibrillation Anterior infarct, old Repol abnrm suggests ischemia, lateral leads afib is old.  T wave inversions are old. Confirmed by Jacalyn Lefevre [phone]) on 03/26/2017 11:02:58 AM       Radiology Dg Chest Port 1 View  Result Date: 03/26/2017 CLINICAL DATA:  83 year old female with a history of trouble breathing EXAM: PORTABLE CHEST 1 VIEW COMPARISON:  03/17/2017, 03/14/2017 FINDINGS: Worsening right sided opacity obscuring the right heart border and the right hemidiaphragm. Thickening of the fissure with pleuroparenchymal thickening along the periphery of the right lung. New airspace opacity in the right suprahilar region. Retrocardiac opacity with obscuration of the left hemidiaphragm. Interlobular septal thickening Similar appearance of cardiomegaly. IMPRESSION: Worsening asymmetric pleural effusions, with enlarging right-sided pleural effusion and associated atelectasis/consolidation. Likely persisting small left-sided pleural effusion/atelectasis. Evidence of interstitial edema. Persisting cardiomegaly. Electronically  Signed   By: Gilmer Mor D.O.   On: 03/26/2017 11:47    Procedures Procedures (including critical care time)  Medications Ordered in ED Medications  furosemide (LASIX) injection 60 mg (not administered)     Initial Impression / Assessment and Plan / ED Course  I have reviewed the triage vital signs and the nursing notes.  Pertinent labs & imaging results that were available during my care of the patient were reviewed by me and considered in my medical decision making (see chart for details).  CRITICAL CARE Performed by: Jacalyn Lefevre   Total critical care time: 30 minutes  Critical care time was exclusive of separately billable procedures and treating other patients.  Critical care was necessary to treat or prevent imminent or life-threatening deterioration.  Critical care was time spent personally by me on the following activities: development of treatment plan with patient and/or surrogate as well as nursing, discussions with consultants, evaluation of patient's response to treatment, examination of patient, obtaining history from patient or surrogate, ordering and performing treatments and interventions, ordering and review of laboratory studies, ordering and review of radiographic studies, pulse oximetry and re-evaluation of patient's condition.    After ABG, pt placed on Bipap.  O2 sats on 30% dropped to 80, so the O2 ws increased.  Pt was closely monitored.  The pt was also given lasix.  She was d/w Dr. Jerolyn Center ([facility]) for admission to [facility].  Final Clinical Impressions(s) / ED Diagnoses   Final diagnoses:  COPD exacerbation (HCC)  Pleural effusion  Permanent atrial fibrillation (HCC)  Acute respiratory failure with hypoxia and hypercapnia (HCC)    New Prescriptions New Prescriptions   No medications on file     Jacalyn Lefevre, MD 03/26/17 1307

## 2017-03-26 NOTE — H&P (Signed)
History and Physical    Emily Elliott BZJ:[id number] DOB: [birth year] DOA: 03/26/2017  PCP: Patient, No Pcp Per Patient coming from: home  Chief Complaint: unable to breathe  HPI: Emily Elliott is a 83 y.o. female with complex medical history including coronary artery disease congestive heart failure, stage III chronic kidney disease, readmitted with increasing shortness of breath. Patient's oxygen saturation was in the 80s on room air. She was placed on a nonrebreather mask. Patient was recently discharged from here after diagnosis with diagnosing with hypoxic hypercapnic respiratory failure due to large right pleural effusion and possible aspiration. She had a thoracentesis done she was treated with Rocephin and azithromycin. And then she was discharged to a skilled nursing facility and then she was discharged home. History is obtained from the caregiver who is also patient's daughter. Patient does have a recent history of urinary tract infection. The daughter reports patient's dry weight is 140 pounds. In the rehabilitation her weight was 155 pounds. She denied any fever chills cough nausea vomiting. Patient is incontinent of urine. Patient does have a colostomy bag. Patient is followed by nephrologist, cardiologist.    ED Cour se: In the ER she was found to be in respiratory acidosis with a pH of 7.2 and a CO2 of 85. She was placed on BiPAP. Chest x-ray showed bilateral pleural effusion.  Review of Systems: As per HPI otherwise all other systems reviewed and are negative  Ambulatory Status:  Past Medical History:  Diagnosis Date  . Atrial fibrillation (HCC)   . Hypertension     Past Surgical History:  Procedure Laterality Date  . COLOSTOMY      Social History   Social History  . Marital status: Widowed    Spouse name: N/A  . Number of children: N/A  . Years of education: N/A   Occupational History  . Not on file.   Social History Main Topics  . Smoking status: Never  Smoker  . Smokeless tobacco: Never Used  . Alcohol use No  . Drug use: No  . Sexual activity: Not on file   Other Topics Concern  . Not on file   Social History Narrative  . No narrative on file    Allergies  Allergen Reactions  . Benadryl [Diphenhydramine] Rash    No family history on file.    Prior to Admission medications   Medication Sig Start Date End Date Taking? Authorizing Provider  Ascorbic Acid (VITAMIN C) 1000 MG tablet Take 1,000 mg by mouth daily.   Yes [provider]  atorvastatin (LIPITOR) 10 MG tablet Take 10 mg by mouth at bedtime.    Yes [provider]  Cholecalciferol (VITAMIN D3) 5000 units CAPS Take 5,000 Units by mouth daily.   Yes [provider]  COD LIVER OIL/LOW VITAMIN A CAPS Take 1 capsule by mouth daily.   Yes [provider]  cyanocobalamin 500 MCG tablet Take 500 mcg by mouth daily.   Yes [provider]  diltiazem (DILACOR XR) 120 MG 24 hr capsule Take 120 mg by mouth daily.   Yes [provider]  ferrous sulfate 324 (65 Fe) MG TBEC Take 1 tablet by mouth daily.   Yes [provider]  furosemide (LASIX) 40 MG tablet Take 40 mg by mouth daily as needed for edema.    Yes [provider]  hydrochlorothiazide (MICROZIDE) 12.5 MG capsule Take 12.5 mg by mouth daily.   Yes [provider]  Magnesium 250 MG TABS Take 250  mg by mouth daily.   Yes [provider]  metoprolol succinate (TOPROL-XL) 50 MG 24 hr tablet Take 50 mg by mouth 2 (two) times daily. Take with or immediately following a meal.   Yes [provider]  Nutritional Supplements (NUTRITIONAL SUPPLEMENT PLUS) LIQD Take 15 mLs by mouth daily.   Yes [provider]  warfarin (COUMADIN) 1 MG tablet Take 1.5 mg by mouth at bedtime. Pt takes this dose every day but Tuesday.   Yes [provider]  warfarin (COUMADIN) 2 MG tablet Take 2 mg by mouth every Tuesday.    Yes [provider]    Physical Exam: Vitals:   03/26/17 1415 03/26/17 1430 03/26/17 1445 03/26/17 1500  BP:  138/81  (!) 143/85  Pulse: 85 77 81 92  Resp: (!) 59 20 (!) 21 (!) 32  Temp:      TempSrc:      SpO2: 100% 99% 100% 100%     General:  Appears calm and comfortable Eyes:  PERRL, EOMI, normal lids, iris ENT:  grossly normal hearing, lips & tongue, mmm Neck:  no LAD, masses or thyromegaly Cardiovascular:  RRR, no m/r/g. No LE edema.  Respiratory:  Decreased breath sounds bilaterally. Abdomen:  soft, ntnd, NABS.colostomy with brown stools. Skin:  no rash or induration seen on limited exam Musculoskeletal:  grossly normal tone BUE/BLE, good ROM, no bony abnormality Psychiatric:  grossly normal mood and affect, speech fluent and appropriate, AOx3 Neurologic:  CN 2-12 grossly intact, moves all extremities in coordinated fashion, sensation intact  Labs on Admission: I have personally reviewed following labs and imaging studies  CBC:  Recent Labs Lab 03/26/17 1100  WBC 7.4  NEUTROABS 6.2  HGB 9.5*  HCT 33.2*  MCV 98.5  PLT 278   Basic Metabolic Panel:  Recent Labs Lab 03/20/17 0454 03/21/17 0425 03/26/17 1100  NA 153* 151* 146*  K 4.5 4.5 4.9  CL 108 106 103  CO2 39* 41* 37*  GLUCOSE 102* 100* 117*  BUN 42* 41* 36*  CREATININE 1.18* 1.13* 1.10*  CALCIUM 7.9* 8.0* 8.2*   GFR: Estimated Creatinine Clearance: 30.2 mL/min (A) (by C-G formula based on SCr of 1.1 mg/dL (H)). Liver Function Tests:  Recent Labs Lab 03/26/17 1100  AST 27  ALT 19  ALKPHOS 62  BILITOT 0.9  PROT 7.0  ALBUMIN 3.2*   No results for input(s): LIPASE, AMYLASE in the last 168 hours. No results for input(s): AMMONIA in the last 168 hours. Coagulation Profile:  Recent Labs Lab 03/20/17 0454 03/21/17 0425 03/26/17 1100  INR 1.70 1.59 1.90   Cardiac Enzymes:  Recent Labs Lab 03/26/17 1100  TROPONINI 0.04*   BNP (last 3 results) No results for input(s): PROBNP in the  last 8760 hours. HbA1C: No results for input(s): HGBA1C in the last 72 hours. CBG: No results for input(s): GLUCAP in the last 168 hours. Lipid Profile: No results for input(s): CHOL, HDL, LDLCALC, TRIG, CHOLHDL, LDLDIRECT in the last 72 hours. Thyroid Function Tests: No results for input(s): TSH, T4TOTAL, FREET4, T3FREE, THYROIDAB in the last 72 hours. Anemia Panel: No results for input(s): VITAMINB12, FOLATE, FERRITIN, TIBC, IRON, RETICCTPCT in the last 72 hours. Urine analysis:    Component Value Date/Time   COLORURINE YELLOW 03/13/2017 2241   APPEARANCEUR HAZY (A) 03/13/2017 2241   LABSPEC 1.016 03/13/2017 2241   PHURINE 6.0 03/13/2017 2241   GLUCOSEU NEGATIVE 03/13/2017 2241   HGBUR NEGATIVE 03/13/2017 2241   BILIRUBINUR NEGATIVE 03/13/2017  2241   KETONESUR NEGATIVE 03/13/2017 2241   PROTEINUR 100 (A) 03/13/2017 2241   NITRITE NEGATIVE 03/13/2017 2241   LEUKOCYTESUR LARGE (A) 03/13/2017 2241    Creatinine Clearance: Estimated Creatinine Clearance: 30.2 mL/min (A) (by C-G formula based on SCr of 1.1 mg/dL (H)).  Sepsis Labs: @LABRCNTIP (procalcitonin:4,lacticidven:4) )No results found for this or any previous visit (from the past 240 hour(s)).   Radiological Exams on Admission: Dg Chest Port 1 View  Result Date: 03/26/2017 CLINICAL DATA:  83 year old female with a history of trouble breathing EXAM: PORTABLE CHEST 1 VIEW COMPARISON:  03/17/2017, 03/14/2017 FINDINGS: Worsening right sided opacity obscuring the right heart border and the right hemidiaphragm. Thickening of the fissure with pleuroparenchymal thickening along the periphery of the right lung. New airspace opacity in the right suprahilar region. Retrocardiac opacity with obscuration of the left hemidiaphragm. Interlobular septal thickening Similar appearance of cardiomegaly. IMPRESSION: Worsening asymmetric pleural effusions, with enlarging right-sided pleural effusion and associated atelectasis/consolidation. Likely  persisting small left-sided pleural effusion/atelectasis. Evidence of interstitial edema. Persisting cardiomegaly. Electronically Signed   By: Gilmer Mor D.O.   On: 03/26/2017 11:47    EKG: Independently reviewed.   Assessment/Plan Active Problems:   Respiratory failure (HCC)   Recurrent pleural effusion with hypoxic hypercapnic respiratory failure patient is currently on BiPAP. I will hold off on starting any antibiotics at this time. She was just discharged from the hospital at that time she was treated with Rocephin and azithromycin. Consult pulmonary tomorrow a.m. she had thoracentesis done on 03/14/2017 and had 0.6 L taken out. I will continue Lasix 40 mg twice a day.  Chronic afib rate controlled on diltiazem. And metoprolol. Continue both.  HTN continue diltiazem and metoprolol.  CKD baseline creatinine is anywhere between 1.13 to 1.18 and today she is 1.10.  Hypernatremia patient is volume depleted intravascularly. But I will hold off on IV hydration due to persistent and recurrent pleural effusion and congestive heart failure. Follow-up levels tomorrow.  CHF ejection fraction 65-70% followed by Dr. Jacinto Halim. Please consult cardiology in the morning.  History of colon cancer and colostomy.(I'm still doing the o          DVT prophylaxis: heparin Code Status: full Family Communication:  Disposition Plan:  Consults called:  Admission status: sdu   Alwyn Ren MD [facility]  If 7PM-7AM, please contact night-coverage [email or web address] Password [facility]  03/26/2017, 3:26 PM

## 2017-03-26 NOTE — ED Notes (Signed)
Bed: JY78 Expected date:  Expected time:  Means of arrival:  Comments: Resp distress

## 2017-03-26 NOTE — Progress Notes (Signed)
Rt placed pt on BIPAP pre MD due to ABG.

## 2017-03-26 NOTE — ED Triage Notes (Signed)
She c/o increasing shortness of breath. Her shortness of breath was greatly relieved by EMS giving her O2. She arrives with her daughter in no distress.

## 2017-03-27 ENCOUNTER — Inpatient Hospital Stay (HOSPITAL_COMMUNITY): Payer: Medicare Other

## 2017-03-27 DIAGNOSIS — I10 Essential (primary) hypertension: Secondary | ICD-10-CM

## 2017-03-27 DIAGNOSIS — Z515 Encounter for palliative care: Secondary | ICD-10-CM

## 2017-03-27 DIAGNOSIS — J9601 Acute respiratory failure with hypoxia: Secondary | ICD-10-CM

## 2017-03-27 DIAGNOSIS — J9602 Acute respiratory failure with hypercapnia: Secondary | ICD-10-CM

## 2017-03-27 DIAGNOSIS — R0602 Shortness of breath: Secondary | ICD-10-CM

## 2017-03-27 DIAGNOSIS — Z7189 Other specified counseling: Secondary | ICD-10-CM

## 2017-03-27 DIAGNOSIS — J90 Pleural effusion, not elsewhere classified: Secondary | ICD-10-CM

## 2017-03-27 DIAGNOSIS — N183 Chronic kidney disease, stage 3 (moderate): Secondary | ICD-10-CM

## 2017-03-27 LAB — BODY FLUID CELL COUNT WITH DIFFERENTIAL
Eos, Fluid: 2 %
LYMPHS FL: 17 %
Monocyte-Macrophage-Serous Fluid: 65 % (ref 50–90)
NEUTROPHIL FLUID: 16 % (ref 0–25)
Total Nucleated Cell Count, Fluid: 792 cu mm (ref 0–1000)

## 2017-03-27 LAB — COMPREHENSIVE METABOLIC PANEL
ALT: 18 U/L (ref 14–54)
AST: 26 U/L (ref 15–41)
Albumin: 3 g/dL — ABNORMAL LOW (ref 3.5–5.0)
Alkaline Phosphatase: 58 U/L (ref 38–126)
Anion gap: 8 (ref 5–15)
BUN: 38 mg/dL — ABNORMAL HIGH (ref 6–20)
CO2: 38 mmol/L — ABNORMAL HIGH (ref 22–32)
Calcium: 8.3 mg/dL — ABNORMAL LOW (ref 8.9–10.3)
Chloride: 100 mmol/L — ABNORMAL LOW (ref 101–111)
Creatinine, Ser: 1.12 mg/dL — ABNORMAL HIGH (ref 0.44–1.00)
GFR calc Af Amer: 48 mL/min — ABNORMAL LOW (ref >60)
GFR calc non Af Amer: 41 mL/min — ABNORMAL LOW (ref >60)
Glucose, Bld: 90 mg/dL (ref 65–99)
Potassium: 4.5 mmol/L (ref 3.5–5.1)
Sodium: 146 mmol/L — ABNORMAL HIGH (ref 135–145)
Total Bilirubin: 1.1 mg/dL (ref 0.3–1.2)
Total Protein: 6.8 g/dL (ref 6.5–8.1)

## 2017-03-27 LAB — ALBUMIN, PLEURAL OR PERITONEAL FLUID: Albumin, Fluid: 1.7 g/dL

## 2017-03-27 LAB — GRAM STAIN

## 2017-03-27 LAB — PROTIME-INR
INR: 1.8
Prothrombin Time: 21.1 seconds — ABNORMAL HIGH (ref 11.4–15.2)

## 2017-03-27 LAB — CBC
HCT: 32.2 % — ABNORMAL LOW (ref 36.0–46.0)
HEMOGLOBIN: 9.3 g/dL — AB (ref 12.0–15.0)
MCH: 28.2 pg (ref 26.0–34.0)
MCHC: 28.9 g/dL — ABNORMAL LOW (ref 30.0–36.0)
MCV: 97.6 fL (ref 78.0–100.0)
PLATELETS: 245 10*3/uL (ref 150–400)
RBC: 3.3 MIL/uL — AB (ref 3.87–5.11)
RDW: 16.7 % — ABNORMAL HIGH (ref 11.5–15.5)
WBC: 7.2 10*3/uL (ref 4.0–10.5)

## 2017-03-27 MED ORDER — DILTIAZEM HCL-DEXTROSE 100-5 MG/100ML-% IV SOLN (PREMIX)
5.0000 mg/h | INTRAVENOUS | Status: DC
  Administered 2017-03-27 (×2): 5 mg/h via INTRAVENOUS
  Filled 2017-03-27 (×2): qty 100

## 2017-03-27 MED ORDER — METOPROLOL TARTRATE 5 MG/5ML IV SOLN
5.0000 mg | Freq: Four times a day (QID) | INTRAVENOUS | Status: DC
  Administered 2017-03-27 – 2017-03-28 (×5): 5 mg via INTRAVENOUS
  Filled 2017-03-27 (×4): qty 5

## 2017-03-27 MED ORDER — HEPARIN SODIUM (PORCINE) 5000 UNIT/ML IJ SOLN
5000.0000 [IU] | Freq: Three times a day (TID) | INTRAMUSCULAR | Status: DC
  Administered 2017-03-27 – 2017-03-31 (×12): 5000 [IU] via SUBCUTANEOUS
  Filled 2017-03-27 (×11): qty 1

## 2017-03-27 MED ORDER — FUROSEMIDE 10 MG/ML IJ SOLN
60.0000 mg | Freq: Two times a day (BID) | INTRAMUSCULAR | Status: DC
  Administered 2017-03-27 (×2): 60 mg via INTRAVENOUS
  Filled 2017-03-27 (×2): qty 6

## 2017-03-27 MED ORDER — DILTIAZEM LOAD VIA INFUSION: 5.0000 mg | Freq: Once | INTRAVENOUS | Status: DC

## 2017-03-27 NOTE — Care Management Note (Signed)
Case Management Note  Patient Details  Name: Emily Elliott MRN: [id number] Date of Birth: 08-14-1920  Subjective/Objective:    Acute chf and respiratory failure                Action/Plan: Date:  March 27, 2017 Chart reviewed for concurrent status and case management needs. Will continue to follow patient progress. Discharge Planning: following for needs Expected discharge date: [id number] Marcelle Smiling, BSN, [place], [place]   [phone] Expected Discharge Date:                  Expected Discharge Plan:  Home/Self Care  In-House Referral:     Discharge planning Services  CM Consult  Post Acute Care Choice:    Choice offered to:     DME Arranged:    DME Agency:     HH Arranged:    HH Agency:     Status of Service:  In process, will continue to follow  If discussed at Long Length of Stay Meetings, dates discussed:    Additional Comments:  Golda Acre, RN 03/27/2017, 8:51 AM

## 2017-03-27 NOTE — Consult Note (Signed)
Consultation Note Date: 03/27/2017   Patient Name: Emily Elliott  DOB: June 11, 1921  MRN: [id number]  Age / Sex: 83 y.o., female  PCP: Patient, No Pcp Per Referring Physician: David Stall, Darin Engels, MD  Reason for Consultation: Establishing goals of care  HPI/Patient Profile: is a 83 y.o. female with She has history of chronic atrial fibrillation, chronic diastolic heart failure, stage III chronic kidney disease and chronic venous stasis lower extremity edema.   Clinical Assessment and Goals of Care: Emily Elliott is resting in bed on BiPap. She has just had a thoracentesis. She says "yes" when asked if she is breathing better.   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 They did state she would not want chest compressions, intubation, or to be shocked if her heart stopped. They base this on her past decision for surgical treatment for intestinal cancer and her comment that she might would have decided differently if she had realized what it meant with colostomy. They would like all care to that point and continue to be very hopeful for improvement. We will watch and continue discussions with family regarding GOC and expectations.    Daughters are decision makers at this time. Hopeful that patient may be able to wean from BiPAP and participate in GOC as well.     SUMMARY OF RECOMMENDATIONS   Continue to follow.   DNR at this time.   Palliative Prophylaxis:   Oral Care, aspiration, turn/reposition  Additional Recommendations (Limitations, Scope, Preferences):  Continue all aggressive treatment to the point of DNR  Psycho-social/Spiritual:   Desire for further Chaplaincy support: Catholic - family has requested priest visit and spiritual care consulted to assist  Prognosis:   Unable to determine - prognosis very poor with recurrent pleural effusions and  progressing heart failure.   Discharge Planning: To Be Determined      Primary Diagnoses: Present on Admission: . Respiratory failure (HCC) . Acute respiratory failure with hypoxia and hypercapnia (HCC) . Atrial fibrillation, chronic (HCC) . CKD (chronic kidney disease), stage III . Essential hypertension . Hypernatremia . Pleural effusion, right   I have reviewed the medical record, interviewed the patient and family, and examined the patient. The following aspects are pertinent.  Past Medical History:  Diagnosis Date  . Atrial fibrillation (HCC)   . Hypertension    Social History   Social History  . Marital status: Widowed    Spouse name: N/A  . Number of children: N/A  . Years of education: N/A   Social History Main Topics  . Smoking status: Never Smoker  . Smokeless tobacco: Never Used  . Alcohol use No  . Drug use: No  . Sexual activity: Not Asked   Other Topics Concern  . None   Social History Narrative  . None   History reviewed. No pertinent family history. Scheduled Meds: . chlorhexidine  15 mL Mouth Rinse BID  . furosemide  60 mg Intravenous BID  . heparin  5,000 Units Subcutaneous  Q8H  . mouth rinse  15 mL Mouth Rinse q12n4p  . metoprolol tartrate  5 mg Intravenous Q6H   Continuous Infusions: . diltiazem (CARDIZEM) infusion 5 mg/hr (03/27/17 1400)   PRN Meds:.polyvinyl alcohol Medications Prior to Admission:  Prior to Admission medications   Medication Sig Start Date End Date Taking? Authorizing Provider  Ascorbic Acid (VITAMIN C) 1000 MG tablet Take 1,000 mg by mouth daily.   Yes [provider]  atorvastatin (LIPITOR) 10 MG tablet Take 10 mg by mouth at bedtime.    Yes [provider]  Cholecalciferol (VITAMIN D3) 5000 units CAPS Take 5,000 Units by mouth daily.   Yes [provider]  COD LIVER OIL/LOW VITAMIN A CAPS Take 1 capsule by mouth daily.   Yes [provider]  cyanocobalamin 500 MCG tablet Take  500 mcg by mouth daily.   Yes [provider]  diltiazem (DILACOR XR) 120 MG 24 hr capsule Take 120 mg by mouth daily.   Yes [provider]  ferrous sulfate 324 (65 Fe) MG TBEC Take 1 tablet by mouth daily.   Yes [provider]  furosemide (LASIX) 40 MG tablet Take 40 mg by mouth daily as needed for edema.    Yes [provider]  hydrochlorothiazide (MICROZIDE) 12.5 MG capsule Take 12.5 mg by mouth daily.   Yes [provider]  Magnesium 250 MG TABS Take 250 mg by mouth daily.   Yes [provider]  metoprolol succinate (TOPROL-XL) 50 MG 24 hr tablet Take 50 mg by mouth 2 (two) times daily. Take with or immediately following a meal.   Yes [provider]  Nutritional Supplements (NUTRITIONAL SUPPLEMENT PLUS) LIQD Take 15 mLs by mouth daily.   Yes [provider]  warfarin (COUMADIN) 1 MG tablet Take 1.5 mg by mouth at bedtime. Pt takes this dose every day but Tuesday.   Yes [provider]  warfarin (COUMADIN) 2 MG tablet Take 2 mg by mouth every Tuesday.    Yes [provider]   Allergies  Allergen Reactions  . Benadryl [Diphenhydramine] Rash   Review of Systems  Constitutional: Positive for activity change.    Physical Exam  HENT:  Head: Normocephalic and atraumatic.  Eyes: EOM are normal.  Cardiovascular:  Warm and dry  Pulmonary/Chest: Effort normal.  BiPap  Abdominal: Soft.  Neurological: She is alert.    Vital Signs: BP (!) 158/90   Pulse 92   Temp 98.2 F (36.8 C)   Resp (!) 22   Ht 5\' 3"  (1.6 m)   Wt 70 kg (154 lb 5.2 oz)   SpO2 98%   BMI 27.34 kg/m  Pain Assessment: CPOT POSS *See Group Information*: 1-Acceptable,Awake and alert Pain Score: 0-No pain   SpO2: SpO2: 98 % O2 Device:SpO2: 98 % O2 Flow Rate: .O2 Flow Rate (L/min): 6 L/min  IO: Intake/output summary:  Intake/Output Summary (Last 24 hours) at 03/27/17 1733 Last data filed at 03/27/17 1700  Gross per 24  hour  Intake               75 ml  Output             2850 ml  Net            -2775 ml    LBM: Last BM Date: 03/27/17 Baseline Weight: Weight: 70 kg (154 lb 5.2 oz) Most recent weight: Weight: 70 kg (154 lb 5.2 oz)     Palliative Assessment/Data: 20%  Time Total: 55 minutes Greater than 50%  of this time was spent counseling and coordinating care related to the above assessment and plan.  Signed by: Morton Stall, NP   Yong Channel, NP Palliative Medicine Team Pager # [phone] (M-F 8a-5p) Team Phone # [phone] (Nights/Weekends)   Please contact Palliative Medicine Team phone at [phone] for questions and concerns.  For individual provider: See Loretha Stapler

## 2017-03-27 NOTE — Consult Note (Signed)
CARDIOLOGY CONSULT NOTE  Patient ID: Emily Elliott MRN: [id number] DOB/AGE: 1920-10-24 83 y.o.  Admit date: 03/26/2017 Referring Physician  Georgette Shell MD Reason for Consultation  Chf, pleural effusion  HPI: Emily Elliott  is a 83 y.o. female  With She has history of chronic atrial fibrillation, chronic diastolic heart failure, stage III chronic kidney disease and chronic venous stasis lower extremity edema.  She was admitted to the hospital on 03/13/2017 with generalized weakness, failure to thrive, poor oral intake, worsening leg edema and abdominal distention and was diagnosed with UTI and patient was also found to be in acute decompensated diastolic heart failure for which she was placed on BiPAP and had received 1 L IV fluids with initial presentation with shock and sepsis. She also underwent right pleural effusion pleurocentesis and had 700 mL of straw-colored fluid removed on her prior admission.   She was discharged to Nursing home for rehabilitation, on 03/21/2017 however 2 days after the discharge, she started having worsening dyspnea and respiratory distress and was readmitted back to the hospital yesterday and chest x-ray reveals large right pleural effusion and possible aspiration and also small left pleural effusion that is persistent along with pulmonary vascular congestion. She has now been scheduled for repeat pleurocentesis this morning.  Patient's daughter is present at the bedside, I know them well. Patient is still in acute respiratory distress and is presently on BiPAP. Unable to complicate much due to fatigue and respiratory distress but states that except for cough and dyspnea no other specific complaints.   Past Medical History:  Diagnosis Date  . Atrial fibrillation ([place])   . Hypertension      Past Surgical History:  Procedure Laterality Date  . COLOSTOMY       History reviewed. No pertinent family history. No history of premature coronary artery disease or  diabetes in the family.  Social History: Social History   Social History  . Marital status: Widowed    Spouse name: N/A  . Number of children: N/A  . Years of education: N/A   Occupational History  . Not on file.   Social History Main Topics  . Smoking status: Never Smoker  . Smokeless tobacco: Never Used  . Alcohol use No  . Drug use: No  . Sexual activity: Not on file   Other Topics Concern  . Not on file   Social History Narrative  . No narrative on file     Prescriptions Prior to Admission  Medication Sig Dispense Refill Last Dose  . Ascorbic Acid (VITAMIN C) 1000 MG tablet Take 1,000 mg by mouth daily.   03/26/2017 at Unknown time  . atorvastatin (LIPITOR) 10 MG tablet Take 10 mg by mouth at bedtime.    03/25/2017 at Unknown time  . Cholecalciferol (VITAMIN D3) 5000 units CAPS Take 5,000 Units by mouth daily.   03/26/2017 at Unknown time  . COD LIVER OIL/LOW VITAMIN A CAPS Take 1 capsule by mouth daily.   03/26/2017 at Unknown time  . cyanocobalamin 500 MCG tablet Take 500 mcg by mouth daily.   03/26/2017 at Unknown time  . diltiazem (DILACOR XR) 120 MG 24 hr capsule Take 120 mg by mouth daily.   03/26/2017 at Unknown time  . ferrous sulfate 324 (65 Fe) MG TBEC Take 1 tablet by mouth daily.   03/26/2017 at Unknown time  . furosemide (LASIX) 40 MG tablet Take 40 mg by mouth daily as needed for edema.    Past Month at Unknown  time  . hydrochlorothiazide (MICROZIDE) 12.5 MG capsule Take 12.5 mg by mouth daily.   03/26/2017 at Unknown time  . Magnesium 250 MG TABS Take 250 mg by mouth daily.   03/26/2017 at Unknown time  . metoprolol succinate (TOPROL-XL) 50 MG 24 hr tablet Take 50 mg by mouth 2 (two) times daily. Take with or immediately following a meal.   03/26/2017 at 0900  . Nutritional Supplements (NUTRITIONAL SUPPLEMENT PLUS) LIQD Take 15 mLs by mouth daily.   03/26/2017 at Unknown time  . warfarin (COUMADIN) 1 MG tablet Take 1.5 mg by mouth at bedtime. Pt takes this dose every  day but Tuesday.   03/25/2017 at 2100  . warfarin (COUMADIN) 2 MG tablet Take 2 mg by mouth every Tuesday.    03/21/2017 at 2100   Review of Systems - Positive for Chronic leg edema worse recently, marked fatigue, decreased appetite, bilateral weakness in the lower extremity. Negative for bleeding diathesis, dark stools, change in bowel habits, no headache or visual disturbances, no hemoptysis. other systems were negative    Physical Exam: Blood pressure 117/63, pulse 89, temperature 97.7 F (36.5 C), resp. rate 15, height 5' 3"  (1.6 m), weight 70 kg (154 lb 5.2 oz), SpO2 (!) 88 %.  Oxygen saturation 99% on BiPAP.   General appearance: appears stated age, fatigued, moderate distress and slowed mentation Lungs: Bilateral basal coarse crackles present left worse on the right. Heart: S1 is variable, S2 is normal. 2/6 midsystolic murmur in the tricuspid area. JVD elevated to the angle of jaw, hepatojugular reflux present. Abdomen: soft, non-tender; bowel sounds normal; no masses,  no organomegaly and Obese Ileostomy bag left iliac fossa noted and appears healthy. Extremities: 1-2 plus edema + Pulses: Carotid pulses normal, femoral pulse normal, popliteal pulse., Absent pedal pulses. Absent hair, thin shiny skin. No evidence of acute arterial insufficiency. Neurologic: Grossly normal  Labs:   Lab Results  Component Value Date   WBC 7.2 03/27/2017   HGB 9.3 (L) 03/27/2017   HCT 32.2 (L) 03/27/2017   MCV 97.6 03/27/2017   PLT 245 03/27/2017     Recent Labs Lab 03/27/17 0308  NA 146*  K 4.5  CL 100*  CO2 38*  BUN 38*  CREATININE 1.12*  CALCIUM 8.3*  PROT 6.8  BILITOT 1.1  ALKPHOS 58  ALT 18  AST 26  GLUCOSE 90    BNP (last 3 results)  Recent Labs  03/13/17 1835 03/26/17 1100  BNP 761.6* 828.6*    Recent Labs  03/26/17 1100  TROPONINI 0.04*   Recent Labs  03/14/17 1010  TSH 2.077    Radiology: Dg Chest Port 1 View  Result Date: 03/26/2017 CLINICAL DATA:   83 year old female with a history of trouble breathing EXAM: PORTABLE CHEST 1 VIEW COMPARISON:  03/17/2017, 03/14/2017 FINDINGS: Worsening right sided opacity obscuring the right heart border and the right hemidiaphragm. Thickening of the fissure with pleuroparenchymal thickening along the periphery of the right lung. New airspace opacity in the right suprahilar region. Retrocardiac opacity with obscuration of the left hemidiaphragm. Interlobular septal thickening Similar appearance of cardiomegaly. IMPRESSION: Worsening asymmetric pleural effusions, with enlarging right-sided pleural effusion and associated atelectasis/consolidation. Likely persisting small left-sided pleural effusion/atelectasis. Evidence of interstitial edema. Persisting cardiomegaly. Electronically Signed   By: Corrie Mckusick D.O.   On: 03/26/2017 11:47    Scheduled Meds: . chlorhexidine  15 mL Mouth Rinse BID  . furosemide  60 mg Intravenous BID  . mouth rinse  15 mL Mouth  Rinse q12n4p  . metoprolol tartrate  5 mg Intravenous Q6H   Continuous Infusions: . diltiazem (CARDIZEM) infusion 5 mg/hr (03/27/17 0922)   PRN Meds:.polyvinyl alcohol  CARDIAC STUDIES:  EKG 03/26/2017: Atrial fibrillation with controlled ventricular response at the rate of 90 beats a minute, normal axis, poor progression, cannot exclude anterior infarct old. LVH with repolarization abnormality, cannot exclude lateral wall ischemia.  Echo: 03/14/2017: Hyperdynamic LV, EF 65-70%, unable to evaluate diastolic function, mildly dilated left atrium, right ventricle mildly dilated with low normal RV systolic function, moderate TR, moderate to severe pulmonary hypertension.  ASSESSMENT AND PLAN:  1. Chronic atrial fibrillation, rate controlled and now on IV Dilt and IV Metoprolol as patient is NPO for pleurocentesis CHA2DS2-VASCScore: Risk Score  4,  Yearly risk of stroke  4. Recommendation: ASA No/Anticoagulation Yes. Not on anticoagulation and was on Warfarin  on admission. INR yesterday 1.9.  2. Shortness of breath  And acute respiratory distress due to pulmonary edema and acute on chronic  diastolic heart failure 3. Moderate to severe pulmonary hypertension, probably secondary to # 1 &  2. 5. Hypertension 6. Chronic stage III kidney disease, stable  Recommendation: Previously in a stable setting, her main issues where A. fib with RVR and rate has been well controlled on combination of beta blocker and calcium channel blocker without heart block. Continue diuresis for now, agree with pleurocentesis, old off on anticoagulation due to underlying comorbidities.  I know the family well, I met the daughter at the bedside, we have discussed regarding palliative care/comfort care if she does not improve and continues to be in respiratory distress in spite of pleurocentesis. Patient's children are also on the way. I'll continue to follow sidelines for now, no specific recommendation from cardiac standpoint.   Consider DVT prophylaxis with heparin and will order as INR is subtherapeutic and Coumadin on hold.   Adrian Prows, MD 03/27/2017, 9:24 AM [facility] [place] Pager: [phone] Office: [phone] If no answer Cell [phone]

## 2017-03-27 NOTE — Procedures (Addendum)
PROCEDURE SUMMARY:  Successful US guided right diagnostic and therapeutic thoracentesis. Yielded 520 mL of blood-tinged fluid. Pt tolerated procedure well, however procedure was stopped prior to removal of all fluid due to patient discomfort. No immediate complications.  Specimen was sent for labs. CXR ordered.  Hoyt Koch PA-C 03/27/2017 4:41 PM

## 2017-03-27 NOTE — Progress Notes (Signed)
[facility] PROGRESS NOTE    Progress Note  Emily Elliott  EXB:[id number] DOB: 14-Nov-1920 DOA: 03/26/2017 PCP: Patient, No Pcp Per     Brief Narrative:   Emily Elliott is an 83 y.o. female past medical history of coronary artery disease, diastolic congestive heart failure with an EF of 65%, and pulmonary hypertension, stage III chronic kidney disease with baseline creatinine 1.1-1.3, recently discharged from the hospital for acute respiratory failure with hypoxia due to large pleural effusion possible aspiration comes in for shortness of breath,  Assessment/Plan:   Acute respiratory failure with hypoxia and hypercapnia (HCC)Due to acute decompensated diastolic heart failure/Pleural effusion, right: Consulted to perform thoracocentesis. BNP is elevated, she she has JVD on physical exam and trace edema or increase IV Lasix. Monitor electrolytes. At a long discussion with family about long-term prognoses. He would like to meet with palliative care. I will request a cardiology to be consulted.  Acute encephalopathy: Likely due to hypercarbia, No increase her PEEP on BiPAP, will diurese her aggressively. We'll check an ABG in 2 hours.  Atrial fibrillation, chronic (HCC) Rate controlled INR subtherapeutic hold Coumadin.  Essential hypertension:  Blood pressure was controlled continue current regimen.  Hypernatremia She seems to be fluid overloaded was started on IV Lasix.    CKD (chronic kidney disease), stage III Seems at this time we'll continue to monitor.  Respiratory acidosis: Due to hypercarbia will increase her PEEP.  DVT prophylaxis: coumadin for now Family Communication:daughter Disposition Plan/Barrier to D/C: unable to determine Code Status:     Code Status Orders        Start     Ordered   03/26/17 1405  Full code  Continuous     03/26/17 1405    Code Status History    Date Active Date Inactive Code Status Order ID Comments User Context   03/13/2017  9:19 PM 03/21/2017  4:45 PM Full Code [id number]  Briscoe Deutscher, MD ED    Advance Directive Documentation     Most Recent Value  Type of Advance Directive  Healthcare Power of Attorney  Pre-existing out of facility DNR order (yellow form or pink MOST form)  -  "MOST" Form in Place?  -        IV Access:    Peripheral IV   Procedures and diagnostic studies:   Dg Chest Port 1 View  Result Date: 03/26/2017 CLINICAL DATA:  83 year old female with a history of trouble breathing EXAM: PORTABLE CHEST 1 VIEW COMPARISON:  03/17/2017, 03/14/2017 FINDINGS: Worsening right sided opacity obscuring the right heart border and the right hemidiaphragm. Thickening of the fissure with pleuroparenchymal thickening along the periphery of the right lung. New airspace opacity in the right suprahilar region. Retrocardiac opacity with obscuration of the left hemidiaphragm. Interlobular septal thickening Similar appearance of cardiomegaly. IMPRESSION: Worsening asymmetric pleural effusions, with enlarging right-sided pleural effusion and associated atelectasis/consolidation. Likely persisting small left-sided pleural effusion/atelectasis. Evidence of interstitial edema. Persisting cardiomegaly. Electronically Signed   By: Gilmer Mor D.O.   On: 03/26/2017 11:47     Medical Consultants:    None.  Anti-Infectives:   None  Subjective:    Emily Elliott patient cannot respond to yes and no questions.  Objective:    Vitals:   03/26/17 2200 03/26/17 2300 03/27/17 0000 03/27/17 0322  BP: 135/76 (!) 151/82    Pulse: 83 82    Resp: 19 (!) 22    Temp:   97.6 F (36.4 C) (!) 97 F (36.1 C)  TempSrc:   Axillary Axillary  SpO2: 99% 98%    Weight:      Height:        Intake/Output Summary (Last 24 hours) at 03/27/17 0708 Last data filed at 03/26/17 1520  Gross per 24 hour  Intake                0 ml  Output              650 ml  Net             -650 ml   Filed Weights   03/26/17  1552  Weight: 70 kg (154 lb 5.2 oz)    Exam: General exam: Laying comfortably in bed Respiratory system: Good air movement and crackles on the left and decreased sounds on the right. Cardiovascular system: Irregular rate and rhythm with positive S1 and S2 positive JVD Gastrointestinal system: Abdomen is soft nontender nondistended. Central nervous system: Alert to person nonfocal. Extremities: No lower extremity edema. Skin: No rashes or ulceration. Psychiatry: Judgement and insight appear normal. Mood & affect appropriate.    Data Reviewed:    Labs: Basic Metabolic Panel:  Recent Labs Lab 03/21/17 0425 03/26/17 1100 03/27/17 0308  NA 151* 146* 146*  K 4.5 4.9 4.5  CL 106 103 100*  CO2 41* 37* 38*  GLUCOSE 100* 117* 90  BUN 41* 36* 38*  CREATININE 1.13* 1.10* 1.12*  CALCIUM 8.0* 8.2* 8.3*   GFR Estimated Creatinine Clearance: 29.4 mL/min (A) (by C-G formula based on SCr of 1.12 mg/dL (H)). Liver Function Tests:  Recent Labs Lab 03/26/17 1100 03/27/17 0308  AST 27 26  ALT 19 18  ALKPHOS 62 58  BILITOT 0.9 1.1  PROT 7.0 6.8  ALBUMIN 3.2* 3.0*   No results for input(s): LIPASE, AMYLASE in the last 168 hours. No results for input(s): AMMONIA in the last 168 hours. Coagulation profile  Recent Labs Lab 03/21/17 0425 03/26/17 1100 03/27/17 0308  INR 1.59 1.90 1.80    CBC:  Recent Labs Lab 03/26/17 1100 03/27/17 0308  WBC 7.4 7.2  NEUTROABS 6.2  --   HGB 9.5* 9.3*  HCT 33.2* 32.2*  MCV 98.5 97.6  PLT 278 245   Cardiac Enzymes:  Recent Labs Lab 03/26/17 1100  TROPONINI 0.04*   BNP (last 3 results) No results for input(s): PROBNP in the last 8760 hours. CBG: No results for input(s): GLUCAP in the last 168 hours. D-Dimer: No results for input(s): DDIMER in the last 72 hours. Hgb A1c: No results for input(s): HGBA1C in the last 72 hours. Lipid Profile: No results for input(s): CHOL, HDL, LDLCALC, TRIG, CHOLHDL, LDLDIRECT in the last 72  hours. Thyroid function studies: No results for input(s): TSH, T4TOTAL, T3FREE, THYROIDAB in the last 72 hours.  Invalid input(s): FREET3 Anemia work up: No results for input(s): VITAMINB12, FOLATE, FERRITIN, TIBC, IRON, RETICCTPCT in the last 72 hours. Sepsis Labs:  Recent Labs Lab 03/26/17 1100 03/27/17 0308  WBC 7.4 7.2   Microbiology Recent Results (from the past 240 hour(s))  MRSA PCR Screening     Status: None   Collection Time: 03/26/17  3:41 PM  Result Value Ref Range Status   MRSA by PCR NEGATIVE NEGATIVE Final    Comment:        The GeneXpert MRSA Assay (FDA approved for NASAL specimens only), is one component of a comprehensive MRSA colonization surveillance program. It is not intended to diagnose MRSA infection nor to guide or monitor  treatment for MRSA infections.      Medications:   . atorvastatin  10 mg Oral QHS  . chlorhexidine  15 mL Mouth Rinse BID  . diltiazem  120 mg Oral Daily  . furosemide  40 mg Intravenous BID  . mouth rinse  15 mL Mouth Rinse q12n4p  . metoprolol succinate  50 mg Oral BID WC  . warfarin  1.5 mg Oral Once per day on Sun Mon Wed Thu Fri Sat   And  . [START ON 03/28/2017] warfarin  2 mg Oral Once per day on Tue  . Warfarin - Physician Dosing Inpatient   Does not apply q1800   Continuous Infusions:   LOS: 1 day   Marinda Elk  [facility] Pager [phone]  *Please refer to [email or web address], password [email or web address] to get updated schedule on who will round on this patient, as hospitalists switch teams weekly. If 7PM-7AM, please contact night-coverage at [email or web address], password [email or web address] for any overnight needs.  03/27/2017, 7:08 AM

## 2017-03-28 DIAGNOSIS — E870 Hyperosmolality and hypernatremia: Secondary | ICD-10-CM

## 2017-03-28 DIAGNOSIS — I5033 Acute on chronic diastolic (congestive) heart failure: Secondary | ICD-10-CM

## 2017-03-28 LAB — HEPATIC FUNCTION PANEL
ALBUMIN: 3 g/dL — AB (ref 3.5–5.0)
ALK PHOS: 63 U/L (ref 38–126)
ALT: 15 U/L (ref 14–54)
AST: 24 U/L (ref 15–41)
BILIRUBIN DIRECT: 0.2 mg/dL (ref 0.1–0.5)
BILIRUBIN TOTAL: 0.8 mg/dL (ref 0.3–1.2)
Indirect Bilirubin: 0.6 mg/dL (ref 0.3–0.9)
Total Protein: 6.8 g/dL (ref 6.5–8.1)

## 2017-03-28 LAB — BASIC METABOLIC PANEL
ANION GAP: 9 (ref 5–15)
BUN: 41 mg/dL — AB (ref 6–20)
CO2: 48 mmol/L — AB (ref 22–32)
Calcium: 8.5 mg/dL — ABNORMAL LOW (ref 8.9–10.3)
Chloride: 91 mmol/L — ABNORMAL LOW (ref 101–111)
Creatinine, Ser: 1.24 mg/dL — ABNORMAL HIGH (ref 0.44–1.00)
GFR calc Af Amer: 42 mL/min — ABNORMAL LOW (ref >60)
GFR, EST NON AFRICAN AMERICAN: 36 mL/min — AB (ref >60)
GLUCOSE: 105 mg/dL — AB (ref 65–99)
POTASSIUM: 3.4 mmol/L — AB (ref 3.5–5.1)
Sodium: 148 mmol/L — ABNORMAL HIGH (ref 135–145)

## 2017-03-28 LAB — LACTATE DEHYDROGENASE: LDH: 158 U/L (ref 98–192)

## 2017-03-28 LAB — PROTIME-INR
INR: 2.57
PROTHROMBIN TIME: 28.1 s — AB (ref 11.4–15.2)

## 2017-03-28 MED ORDER — ATORVASTATIN CALCIUM 10 MG PO TABS
10.0000 mg | ORAL_TABLET | Freq: Every day | ORAL | Status: DC
  Administered 2017-03-28 – 2017-03-29 (×2): 10 mg via ORAL
  Filled 2017-03-28 (×2): qty 1

## 2017-03-28 MED ORDER — FUROSEMIDE 10 MG/ML IJ SOLN
40.0000 mg | Freq: Two times a day (BID) | INTRAMUSCULAR | Status: DC
Start: 2017-03-28 — End: 2017-03-31
  Administered 2017-03-28 – 2017-03-31 (×6): 40 mg via INTRAVENOUS
  Filled 2017-03-28 (×7): qty 4

## 2017-03-28 MED ORDER — METOPROLOL SUCCINATE ER 50 MG PO TB24
50.0000 mg | ORAL_TABLET | Freq: Two times a day (BID) | ORAL | Status: DC
  Administered 2017-03-28 – 2017-03-31 (×5): 50 mg via ORAL
  Filled 2017-03-28 (×2): qty 1
  Filled 2017-03-28: qty 2
  Filled 2017-03-28: qty 1
  Filled 2017-03-28: qty 2
  Filled 2017-03-28 (×2): qty 1

## 2017-03-28 MED ORDER — NUTRITIONAL SUPPLEMENT PLUS PO LIQD: 15.0000 mL | Freq: Every day | ORAL | Status: DC

## 2017-03-28 MED ORDER — DILTIAZEM HCL ER COATED BEADS 120 MG PO CP24
120.0000 mg | ORAL_CAPSULE | Freq: Every day | ORAL | Status: DC
  Administered 2017-03-28 – 2017-03-31 (×4): 120 mg via ORAL
  Filled 2017-03-28 (×4): qty 1

## 2017-03-28 MED ORDER — MAGNESIUM OXIDE 400 (241.3 MG) MG PO TABS
200.0000 mg | ORAL_TABLET | Freq: Every day | ORAL | Status: DC
  Administered 2017-03-28 – 2017-03-31 (×4): 200 mg via ORAL
  Filled 2017-03-28 (×4): qty 1

## 2017-03-28 MED ORDER — ADULT MULTIVITAMIN LIQUID CH
15.0000 mL | Freq: Every day | ORAL | Status: DC
  Administered 2017-03-29 – 2017-03-30 (×2): 15 mL via ORAL
  Filled 2017-03-28 (×4): qty 15

## 2017-03-28 MED ORDER — FUROSEMIDE 10 MG/ML IJ SOLN: 20.0000 mg | Freq: Two times a day (BID) | INTRAMUSCULAR | Status: DC

## 2017-03-28 NOTE — Clinical Social Work Note (Signed)
Clinical Social Work Assessment  Patient Details  Name: Emily Elliott MRN: [id number] Date of Birth: [birth year]  Date of referral:  03/28/17               Reason for consult:  Facility Placement, Discharge Planning                Permission sought to share information with:  Facility Art therapist granted to share information::  Yes, Verbal Permission Granted  Name::        Agency::     Relationship::     Contact Information:     Housing/Transportation Living arrangements for the past 2 months:  [place], [place] of Information:  Adult Children Patient Interpreter Needed:  None Criminal Activity/Legal Involvement Pertinent to Current Situation/Hospitalization:  No - Comment as needed Significant Relationships:  Adult Children Lives with:  Facility Resident, Adult Children Do you feel safe going back to the place where you live?  Yes Need for family participation in patient care:  Yes (Comment)  Care giving concerns:  No concerns voiced at this time.   Social Worker assessment / plan:  Pt hospitalized from [facility] ( [facility] ) on 03/26/17 with respiratory failure. Pt had recently been hospitalized at [facility] ( 8/6-8/14 ) and sent to [facility] for rehab. Palliative Care Team is following fore [place]. CSW met with pt's daughter ( from [place] ) to assist with dc planning. Pt was sleeping during visit. Daughter reports that she just arrived from [place] and recommended CSW contact her sister for assist with dc planning. CSW has left a VM for [name] McLee [phone]. Awaiting return call. CSW will continue to follow to assist with dc planning needs.  Employment status:  Retired Forensic scientist:  Medicare PT Recommendations:  Not assessed at this time Information / Referral to community resources:     Patient/Family's Response to care:  Disposition to be determined.  Patient/Family's Understanding of and Emotional Response to Diagnosis, Current  Treatment, and Prognosis:  Still assessing.  Emotional Assessment Appearance:  Appears stated age Attitude/Demeanor/Rapport:  Unable to Assess Affect (typically observed):  Unable to Assess Orientation:  Oriented to Self Alcohol / Substance use:  Not Applicable Psych involvement (Current and /or in the community):  No (Comment)  Discharge Needs  Concerns to be addressed:  Discharge Planning Concerns Readmission within the last 30 days:  Yes Current discharge risk:  None Barriers to Discharge:  No Barriers Identified   Luretha Rued, [place] 03/28/2017, 1:28 PM

## 2017-03-28 NOTE — Progress Notes (Signed)
   03/28/17 1300  Clinical Encounter Type  Visited With Patient  Visit Type Initial;Psychological support;Spiritual support;Critical Care  Referral From Nurse  Consult/Referral To Chaplain  Spiritual Encounters  Spiritual Needs Emotional;Other (Comment) (Spiritual Conversation/Support)  Stress Factors  Patient Stress Factors None identified   Chaplain was referred to patient with request for a priest. During our conversation, the patient stated that she does not need a priest at this time.   Please, contact Spiritual Care for further assistance.   Chaplain Clint Bolder M.Div.

## 2017-03-28 NOTE — Progress Notes (Signed)
Daily Progress Note   Patient Name: Emily Elliott       Date: 03/28/2017 DOB: May 17, 1921  Age: 83 y.o. MRN#: [id number] Attending Physician: Marinda Elk, MD Primary Care Physician: Patient, No Pcp Per Admit Date: 03/26/2017  Reason for Consultation/Follow-up: Establishing goals of care  Subjective: Ms. Emily Elliott is sitting in bed comfortably and conversing with family. She is slightly confused today. Discussion with daughters and patient. She is feeling better today after her thoracentesis. She states she was happy to have the procedure done as she is breathing easier.   Discussed trajectory of her illness and aggressive vs comfort path including rehospitalization and repeat thoracentesis. Difficult discussion as they say they were not aware she had heart failure until yesterday. She wants to continue with current treatment at this time, and will take one day at a time. Hospice discussed, and patient and family unsure. One daughter is very unsure and explains how her mother has overcome so many obstacles in her life. On the other hand they understand that she is elderly and that "parts wear out." She states she has doctors in the family they will speak with. Will revisit and continue discussion tomorrow.     Length of Stay: 2  Current Medications: Scheduled Meds:  . atorvastatin  10 mg Oral QHS  . chlorhexidine  15 mL Mouth Rinse BID  . diltiazem  120 mg Oral Daily  . furosemide  40 mg Intravenous BID  . heparin  5,000 Units Subcutaneous Q8H  . magnesium oxide  200 mg Oral Daily  . mouth rinse  15 mL Mouth Rinse q12n4p  . metoprolol succinate  50 mg Oral BID WC  . multivitamin  15 mL Oral Daily     PRN Meds: polyvinyl alcohol  Physical Exam  Constitutional: No distress.    HENT:  Head: Normocephalic and atraumatic.  Eyes: EOM are normal.  Cardiovascular:  Warm and dry  Pulmonary/Chest: Effort normal.  Musculoskeletal: She exhibits no edema.  Neurological: She is alert.            Vital Signs: BP 117/66   Pulse 85   Temp 98.6 F (37 C) (Oral)   Resp 18   Ht 5\' 3"  (1.6 m)   Wt 70 kg (154 lb 5.2 oz)   SpO2 100%   BMI 27.34  kg/m  SpO2: SpO2: 100 % O2 Device: O2 Device: Nasal Cannula O2 Flow Rate: O2 Flow Rate (L/min): 2 L/min  Intake/output summary:  Intake/Output Summary (Last 24 hours) at 03/28/17 1603 Last data filed at 03/28/17 1100  Gross per 24 hour  Intake              161 ml  Output             2350 ml  Net            -2189 ml   LBM: Last BM Date: 03/28/17 (colostomy) Baseline Weight: Weight: 70 kg (154 lb 5.2 oz) Most recent weight: Weight: 70 kg (154 lb 5.2 oz)       Palliative Assessment/Data: 20%      Patient Active Problem List   Diagnosis Date Noted  . Goals of care, counseling/discussion   . Palliative care encounter   . Respiratory failure (HCC) 03/26/2017  . Acute CHF (congestive heart failure) (HCC) 03/14/2017  . Coagulopathy (HCC) 03/14/2017  . Atrial fibrillation, chronic (HCC) 03/13/2017  . Essential hypertension 03/13/2017  . Hypernatremia 03/13/2017  . AKI (acute kidney injury) (HCC) 03/13/2017  . Acute encephalopathy 03/13/2017  . UTI (urinary tract infection) 03/13/2017  . History of colon cancer 03/13/2017  . Pleural effusion, right 03/13/2017  . Dysphagia 03/13/2017  . CKD (chronic kidney disease), stage III 03/13/2017  . Acute respiratory failure with hypoxia and hypercapnia (HCC)   . Foot pain, bilateral 05/10/2016    Palliative Care Assessment & Plan   Patient Profile: is a 83 y.o.femalewith She has history of chronic atrial fibrillation, chronic diastolic heart failure, stage III chronic kidney disease and chronic venous stasis lower extremity edema.    Assessment: Ms. Emily Elliott is a  frail woman who would like to continue current treatment.    Recommendations/Plan:  Placement with SNF at discharge.   Consider pleural drain for comfort with hospice in the future.   Goals of Care and Additional Recommendations:  DNR  Code Status:    Code Status Orders        Start     Ordered   03/27/17 1740  Do not attempt resuscitation (DNR)  Continuous    Question Answer Comment  In the event of cardiac or respiratory ARREST Do not call a "code blue"   In the event of cardiac or respiratory ARREST Do not perform Intubation, CPR, defibrillation or ACLS   In the event of cardiac or respiratory ARREST Use medication by any route, position, wound care, and other measures to relive pain and suffering. May use oxygen, suction and manual treatment of airway obstruction as needed for comfort.      03/27/17 1739    Code Status History    Date Active Date Inactive Code Status Order ID Comments User Context   03/26/2017  2:05 PM 03/27/2017  5:39 PM Full Code [id number]  Alwyn Ren, MD ED   03/13/2017  9:19 PM 03/21/2017  4:45 PM Full Code [id number]  Briscoe Deutscher, MD ED    Advance Directive Documentation     Most Recent Value  Type of Advance Directive  Healthcare Power of Attorney  Pre-existing out of facility DNR order (yellow form or pink MOST form)  -  "MOST" Form in Place?  -       Prognosis:   < 6 months is likely.   Discharge Planning:  SNF  Thank you for allowing the Palliative Medicine Team to assist in the care  of this patient.   Total Time 1 hour Prolonged Time Billed  No      Greater than 50%  of this time was spent counseling and coordinating care related to the above assessment and plan.  Morton Stall, NP   Yong Channel, NP Palliative Medicine Team Pager # [phone] (M-F 8a-5p) Team Phone # [phone] (Nights/Weekends)  Please contact Palliative Medicine Team phone at [phone] for questions and concerns.

## 2017-03-28 NOTE — Progress Notes (Addendum)
[facility] PROGRESS NOTE    Progress Note  Emily Elliott  WUJ:[id number] DOB: 1920-09-10 DOA: 03/26/2017 PCP: Patient, No Pcp Per     Brief Narrative:   Emily Elliott is an 83 y.o. female past medical history of coronary artery disease, diastolic congestive heart failure with an EF of 65%, and pulmonary hypertension, stage III chronic kidney disease with baseline creatinine 1.1-1.3, recently discharged from the hospital for acute respiratory failure with hypoxia due to large pleural effusion possible aspiration comes in for shortness of breath,  Assessment/Plan:   Acute respiratory failure with hypoxia and hypercapnia (HCC)Due to acute decompensated diastolic heart failure/Pleural effusion, right: Status post centesis-year-old 520 ML's of blood tinged sputum, cytology is pending Cont IV lasix for one more day. Monitor electrolytes.  At a long discussion with family about long-term prognoses. Daughter would like to meet with palliative care. Cardiology on board nothing to offer.  Acute encephalopathy: Likely due to hypercarbia, resolved.  Atrial fibrillation, chronic (HCC) Resume coumadin diltiazem and metoprolol.  Essential hypertension:  Blood pressure was controlled continue current regimen.  Hypernatremia Resolved with diuresis.  CKD (chronic kidney disease), stage III Seems at this time we'll continue to monitor.  Respiratory acidosis: Resolved.  DVT prophylaxis: coumadin for now Family Communication:daughter Disposition Plan/Barrier to D/C:transfer to med surg. Code Status:     Code Status Orders        Start     Ordered   03/26/17 1405  Full code  Continuous     03/26/17 1405    Code Status History    Date Active Date Inactive Code Status Order ID Comments User Context   03/13/2017  9:19 PM 03/21/2017  4:45 PM Full Code [id number]  Briscoe Deutscher, MD ED    Advance Directive Documentation     Most Recent Value  Type of Advance Directive   Healthcare Power of Attorney  Pre-existing out of facility DNR order (yellow form or pink MOST form)  -  "MOST" Form in Place?  -        IV Access:    Peripheral IV   Procedures and diagnostic studies:   Dg Chest Port 1 View  Result Date: 03/27/2017 CLINICAL DATA:  Chest pain after RIGHT thoracentesis EXAM: PORTABLE CHEST 1 VIEW COMPARISON:  Chest radiograph 03/26/2017 FINDINGS: Reduction in volume of the RIGHT pleural effusion following thoracentesis. No clear pneumothorax identified in the RIGHT or LEFT hemithorax. Small bilateral pleural effusions remain. IMPRESSION: 1. No appreciable pneumothorax following thoracentesis 2. Reduction in volume of RIGHT pleural effusion. 3. Persistent bilateral small effusions Electronically Signed   By: Genevive Bi M.D.   On: 03/27/2017 17:21   Dg Chest Port 1 View  Result Date: 03/26/2017 CLINICAL DATA:  83 year old female with a history of trouble breathing EXAM: PORTABLE CHEST 1 VIEW COMPARISON:  03/17/2017, 03/14/2017 FINDINGS: Worsening right sided opacity obscuring the right heart border and the right hemidiaphragm. Thickening of the fissure with pleuroparenchymal thickening along the periphery of the right lung. New airspace opacity in the right suprahilar region. Retrocardiac opacity with obscuration of the left hemidiaphragm. Interlobular septal thickening Similar appearance of cardiomegaly. IMPRESSION: Worsening asymmetric pleural effusions, with enlarging right-sided pleural effusion and associated atelectasis/consolidation. Likely persisting small left-sided pleural effusion/atelectasis. Evidence of interstitial edema. Persisting cardiomegaly. Electronically Signed   By: Gilmer Mor D.O.   On: 03/26/2017 11:47   US Thoracentesis Asp Pleural Space W/img Guide  Result Date: 03/27/2017 INDICATION: Patient with right pleural effusion. Request is made for diagnostic and  therapeutic thoracentesis. EXAM: ULTRASOUND GUIDED DIAGNOSTIC AND  THERAPEUTIC RIGHT THORACENTESIS MEDICATIONS: 10 mL 1% lidocaine COMPLICATIONS: None immediate. PROCEDURE: An ultrasound guided thoracentesis was thoroughly discussed with the patient and questions answered. The benefits, risks, alternatives and complications were also discussed. The patient understands and wishes to proceed with the procedure. Written consent was obtained. Ultrasound was performed to localize and mark an adequate pocket of fluid in the right chest. The area was then prepped and draped in the normal sterile fashion. 1% Lidocaine was used for local anesthesia. Under ultrasound guidance a Safe-T-Centesis catheter was introduced. Thoracentesis was performed. The catheter was removed and a dressing applied. FINDINGS: A total of approximately 520 mL of blood-tinged fluid was removed. Samples were sent to the laboratory as requested by the clinical team. IMPRESSION: Successful ultrasound guided diagnostic and therapeutic thoracentesis yielding 520 mL of pleural fluid. Read by:  Loyce Dys PA-C Electronically Signed   By: Richarda Overlie M.D.   On: 03/27/2017 17:12     Medical Consultants:    None.  Anti-Infectives:   None  Subjective:    Emily Elliott she has no new complains able to comunicate  Objective:    Vitals:   03/28/17 0500 03/28/17 0600 03/28/17 0612 03/28/17 0712  BP: 131/67 140/80    Pulse: 86 81 78   Resp: 17 20 (!) 23   Temp:    98.5 F (36.9 C)  TempSrc:    Oral  SpO2: 97% 100% 99%   Weight:      Height:        Intake/Output Summary (Last 24 hours) at 03/28/17 0723 Last data filed at 03/28/17 9450  Gross per 24 hour  Intake              136 ml  Output             4200 ml  Net            -4064 ml   Filed Weights   03/26/17 1552  Weight: 70 kg (154 lb 5.2 oz)    Exam: General exam: Laying comfortably in bed Respiratory system: Good air movement and clear to auscultation Cardiovascular system: Irregular rate and rhythm with positive S1 and S2 -  JVD Gastrointestinal system: Abdomen is soft nontender nondistended. Central nervous system: Alert to person nonfocal. Extremities: No lower extremity edema. Skin: No rashes or ulceration. Psychiatry: Judgement and insight appear normal. Mood & affect appropriate.    Data Reviewed:    Labs: Basic Metabolic Panel:  Recent Labs Lab 03/26/17 1100 03/27/17 0308  NA 146* 146*  K 4.9 4.5  CL 103 100*  CO2 37* 38*  GLUCOSE 117* 90  BUN 36* 38*  CREATININE 1.10* 1.12*  CALCIUM 8.2* 8.3*   GFR Estimated Creatinine Clearance: 29.4 mL/min (A) (by C-G formula based on SCr of 1.12 mg/dL (H)). Liver Function Tests:  Recent Labs Lab 03/26/17 1100 03/27/17 0308  AST 27 26  ALT 19 18  ALKPHOS 62 58  BILITOT 0.9 1.1  PROT 7.0 6.8  ALBUMIN 3.2* 3.0*   No results for input(s): LIPASE, AMYLASE in the last 168 hours. No results for input(s): AMMONIA in the last 168 hours. Coagulation profile  Recent Labs Lab 03/26/17 1100 03/27/17 0308 03/28/17 0240  INR 1.90 1.80 2.57    CBC:  Recent Labs Lab 03/26/17 1100 03/27/17 0308  WBC 7.4 7.2  NEUTROABS 6.2  --   HGB 9.5* 9.3*  HCT 33.2* 32.2*  MCV 98.5 97.6  PLT 278 245   Cardiac Enzymes:  Recent Labs Lab 03/26/17 1100  TROPONINI 0.04*   BNP (last 3 results) No results for input(s): PROBNP in the last 8760 hours. CBG: No results for input(s): GLUCAP in the last 168 hours. D-Dimer: No results for input(s): DDIMER in the last 72 hours. Hgb A1c: No results for input(s): HGBA1C in the last 72 hours. Lipid Profile: No results for input(s): CHOL, HDL, LDLCALC, TRIG, CHOLHDL, LDLDIRECT in the last 72 hours. Thyroid function studies: No results for input(s): TSH, T4TOTAL, T3FREE, THYROIDAB in the last 72 hours.  Invalid input(s): FREET3 Anemia work up: No results for input(s): VITAMINB12, FOLATE, FERRITIN, TIBC, IRON, RETICCTPCT in the last 72 hours. Sepsis Labs:  Recent Labs Lab 03/26/17 1100 03/27/17 0308    WBC 7.4 7.2   Microbiology Recent Results (from the past 240 hour(s))  MRSA PCR Screening     Status: None   Collection Time: 03/26/17  3:41 PM  Result Value Ref Range Status   MRSA by PCR NEGATIVE NEGATIVE Final    Comment:        The GeneXpert MRSA Assay (FDA approved for NASAL specimens only), is one component of a comprehensive MRSA colonization surveillance program. It is not intended to diagnose MRSA infection nor to guide or monitor treatment for MRSA infections.   Gram stain     Status: None   Collection Time: 03/27/17  5:07 PM  Result Value Ref Range Status   Specimen Description FLUID PLEURAL RIGHT  Final   Special Requests NONE  Final   Gram Stain   Final    MODERATE WBC PRESENT,BOTH PMN AND MONONUCLEAR NO ORGANISMS SEEN Performed at [facility], [address], [place], [place] [address]    Report Status 03/27/2017 FINAL  Final     Medications:   . chlorhexidine  15 mL Mouth Rinse BID  . furosemide  60 mg Intravenous BID  . heparin  5,000 Units Subcutaneous Q8H  . mouth rinse  15 mL Mouth Rinse q12n4p  . metoprolol tartrate  5 mg Intravenous Q6H   Continuous Infusions: . diltiazem (CARDIZEM) infusion 5 mg/hr (03/28/17 0612)     LOS: 2 days   Marinda Elk  [facility] Pager [phone]  *Please refer to [email or web address], password [email or web address] to get updated schedule on who will round on this patient, as hospitalists switch teams weekly. If 7PM-7AM, please contact night-coverage at [email or web address], password [email or web address] for any overnight needs.  03/28/2017, 7:23 AM

## 2017-03-29 LAB — BASIC METABOLIC PANEL
BUN: 36 mg/dL — ABNORMAL HIGH (ref 6–20)
CHLORIDE: 91 mmol/L — AB (ref 101–111)
Calcium: 8 mg/dL — ABNORMAL LOW (ref 8.9–10.3)
Creatinine, Ser: 1 mg/dL (ref 0.44–1.00)
GFR calc Af Amer: 55 mL/min — ABNORMAL LOW (ref >60)
GFR calc non Af Amer: 47 mL/min — ABNORMAL LOW (ref >60)
GLUCOSE: 96 mg/dL (ref 65–99)
POTASSIUM: 3.3 mmol/L — AB (ref 3.5–5.1)
Sodium: 146 mmol/L — ABNORMAL HIGH (ref 135–145)

## 2017-03-29 LAB — PH, BODY FLUID: PH, BODY FLUID: 7.9

## 2017-03-29 LAB — PATHOLOGIST SMEAR REVIEW

## 2017-03-29 LAB — PROTIME-INR
INR: 2.27
Prothrombin Time: 25.4 seconds — ABNORMAL HIGH (ref 11.4–15.2)

## 2017-03-29 MED ORDER — POTASSIUM CHLORIDE CRYS ER 20 MEQ PO TBCR
40.0000 meq | EXTENDED_RELEASE_TABLET | Freq: Once | ORAL | Status: AC
  Administered 2017-03-29: 40 meq via ORAL
  Filled 2017-03-29: qty 2

## 2017-03-29 NOTE — Progress Notes (Signed)
Daily Progress Note   Patient Name: Emily Elliott       Date: 03/29/2017 DOB: 08/20/1920  Age: 83 y.o. MRN#: [id number] Attending Physician: Leatha Gilding, MD Primary Care Physician: Patient, No Pcp Per Admit Date: 03/26/2017  Reason for Consultation/Follow-up: Establishing goals of care  Subjective: Emily Elliott is resting in bed this afternoon. No complaints. Spoke with daughters who are present. They would like for her to proceed to [facility] and not be brought back to the hospital as they are aware there is nothing that can be done to fix her heart and lungs, would not want repeat thoracentesis, and amendable to transition to comfort and hospice care upon this decline. They state "if there is nothing else that can be done, there is no reason for her to be here."   They would like to continue her current medications and physical rehab at [facility] with palliative care following. They are aware that she may transition to Hospice care if they would like, as she is a Hospice candidate. Today, the daughters state there is a son who is a doctor that is the POA for the patient. Spoke with him via telephone to inquire about code status and GOC planning, he is aware we spoke with his sisters and he states " that is up to them, I do not want anything to do with it."    Length of Stay: 3  Current Medications: Scheduled Meds:  . atorvastatin  10 mg Oral QHS  . chlorhexidine  15 mL Mouth Rinse BID  . diltiazem  120 mg Oral Daily  . furosemide  40 mg Intravenous BID  . heparin  5,000 Units Subcutaneous Q8H  . magnesium oxide  200 mg Oral Daily  . mouth rinse  15 mL Mouth Rinse q12n4p  . metoprolol succinate  50 mg Oral BID WC  . multivitamin  15 mL Oral Daily     PRN  Meds: polyvinyl alcohol  Physical Exam  Constitutional: No distress.  Sitting up bed.   HENT:  Head: Normocephalic and atraumatic.  Eyes: EOM are normal.  Cardiovascular:  Warm and dry  Pulmonary/Chest: Effort normal.  Musculoskeletal: She exhibits no edema.  Neurological: She is alert.  Psychiatric: Her behavior is normal.  Vital Signs: BP 137/74 (BP Location: Left Arm)   Pulse 82   Temp 97.7 F (36.5 C) (Oral)   Resp 18   Ht 5\' 3"  (1.6 m)   Wt 65.4 kg (144 lb 1.6 oz)   SpO2 99%   BMI 25.53 kg/m  SpO2: SpO2: 99 % O2 Device: O2 Device: Nasal Cannula O2 Flow Rate: O2 Flow Rate (L/min): 2.5 L/min  Intake/output summary:   Intake/Output Summary (Last 24 hours) at 03/29/17 1411 Last data filed at 03/29/17 6962  Gross per 24 hour  Intake              105 ml  Output             1000 ml  Net             -895 ml   LBM: Last BM Date: 03/29/17 Baseline Weight: Weight: 70 kg (154 lb 5.2 oz) Most recent weight: Weight: 65.4 kg (144 lb 1.6 oz)       Palliative Assessment/Data: 20%      Patient Active Problem List   Diagnosis Date Noted  . Acute on chronic diastolic congestive heart failure (HCC)   . Goals of care, counseling/discussion   . Palliative care encounter   . Respiratory failure (HCC) 03/26/2017  . Acute CHF (congestive heart failure) (HCC) 03/14/2017  . Coagulopathy (HCC) 03/14/2017  . Atrial fibrillation, chronic (HCC) 03/13/2017  . Essential hypertension 03/13/2017  . Hypernatremia 03/13/2017  . AKI (acute kidney injury) (HCC) 03/13/2017  . Acute encephalopathy 03/13/2017  . UTI (urinary tract infection) 03/13/2017  . History of colon cancer 03/13/2017  . Pleural effusion, right 03/13/2017  . Dysphagia 03/13/2017  . CKD (chronic kidney disease), stage III 03/13/2017  . Acute respiratory failure with hypoxia and hypercapnia (HCC)   . Foot pain, bilateral 05/10/2016    Palliative Care Assessment & Plan   Patient Profile: is a 83  y.o.femalewith She has history of chronic atrial fibrillation, chronic diastolic heart failure, stage III chronic kidney disease and chronic venous stasis lower extremity edema.    Assessment: Emily Elliott is a frail woman who would like to continue current treatment and physical rehab for strengthening.    Recommendations/Plan:  Placement to SNF when medically ready for discharge.   Consider pleural drain for comfort with hospice in the future.   Goals of Care and Additional Recommendations:  DNR  Code Status:    Code Status Orders        Start     Ordered   03/27/17 1740  Do not attempt resuscitation (DNR)  Continuous    Question Answer Comment  In the event of cardiac or respiratory ARREST Do not call a "code blue"   In the event of cardiac or respiratory ARREST Do not perform Intubation, CPR, defibrillation or ACLS   In the event of cardiac or respiratory ARREST Use medication by any route, position, wound care, and other measures to relive pain and suffering. May use oxygen, suction and manual treatment of airway obstruction as needed for comfort.      03/27/17 1739    Code Status History    Date Active Date Inactive Code Status Order ID Comments User Context   03/26/2017  2:05 PM 03/27/2017  5:39 PM Full Code [id number]  Alwyn Ren, MD ED   03/13/2017  9:19 PM 03/21/2017  4:45 PM Full Code [id number]  Briscoe Deutscher, MD ED    Advance Directive Documentation     Most  Recent Value  Type of Advance Directive  Healthcare Power of Attorney  Pre-existing out of facility DNR order (yellow form or pink MOST form)  -  "MOST" Form in Place?  -       Prognosis:   < 2 weeks depending on reaccumilation of pleural fluid .   Discharge Planning:  SNF when medically appropriate with palliative care. May transition to Hospice when patient/ family decides.   Thank you for allowing the Palliative Medicine Team to assist in the care of this patient.   Total Time 1 hour  Prolonged Time Billed  No      Greater than 50%  of this time was spent counseling and coordinating care related to the above assessment and plan.  Morton Stall, NP   Yong Channel, NP Palliative Medicine Team Pager # [phone] (M-F 8a-5p) Team Phone # [phone] (Nights/Weekends)  Please contact Palliative Medicine Team phone at [phone] for questions and concerns.

## 2017-03-29 NOTE — Progress Notes (Signed)
PROGRESS NOTE  Kailen Name NWG:[id number] DOB: Jul 22, 1921 DOA: 03/26/2017 PCP: Patient, No Pcp Per   LOS: 3 days   Brief Narrative / Interim history: Baila Rouse is an 83 y.o. female past medical history of coronary artery disease, diastolic congestive heart failure with an EF of 65%, and pulmonary hypertension, stage III chronic kidney disease with baseline creatinine 1.1-1.3, recently discharged from the hospital for acute respiratory failure with hypoxia due to large pleural effusion possible aspiration comes in for shortness of breath,  Assessment & Plan: Active Problems:   Atrial fibrillation, chronic (HCC)   Essential hypertension   Hypernatremia   Pleural effusion, right   CKD (chronic kidney disease), stage III   Acute respiratory failure with hypoxia and hypercapnia (HCC)   Respiratory failure (HCC)   Goals of care, counseling/discussion   Palliative care encounter   Acute on chronic diastolic congestive heart failure (HCC)   Acute respiratory failure with hypoxia and hypercapnia (HCC)Due to acute decompensated diastolic heart failure/Pleural effusion, right: -s/p paracentesis 8/20 with removal of ~500cc of fluid. Cytology not sent unfortunately -continue IV Lasix -d/w Dr. Jacinto Halim today  Acute encephalopathy -Likely due to hypercarbia, resolved for most part however still lethargic and weak  Atrial fibrillation, chronic (HCC) -Resume coumadin diltiazem and metoprolol. -cardiology recommends to hold anticoagulation due to underlying co morbidities. Wait for INR, if < 2 will need heparin s.q  Essential hypertension: -Blood pressure was controlled continue current regimen.  Hypernatremia -ongoing problem, BMP today is pending. Last time she was hoapitalizedher Na was as high as 156  CKD (chronic kidney disease), stage III -stable  Respiratory acidosis -Resolved  DVT prophylaxis: Coumadin Code Status: DNR Family Communication: daughter  bedside Disposition Plan: TBD  Consultants:   Cardiology   IR  Procedures:   Thoracentesis 8/20  Antimicrobials:  None    Subjective: - no chest pain, shortness of breath, no abdominal pain, nausea or vomiting. Appears very weak. Barely has energy to talk  Objective: Vitals:   03/29/17 0747 03/29/17 0749 03/29/17 0750 03/29/17 0945  BP:  100/84 100/84 103/62  Pulse:  83 83   Resp:      Temp:      TempSrc:      SpO2:  100% 100%   Weight: 65.4 kg (144 lb 2.9 oz)  65.4 kg (144 lb 1.6 oz)   Height:        Intake/Output Summary (Last 24 hours) at 03/29/17 1225 Last data filed at 03/29/17 0936  Gross per 24 hour  Intake              115 ml  Output             1000 ml  Net             -885 ml   Filed Weights   03/26/17 1552 03/29/17 0747 03/29/17 0750  Weight: 70 kg (154 lb 5.2 oz) 65.4 kg (144 lb 2.9 oz) 65.4 kg (144 lb 1.6 oz)    Examination:  Vitals:   03/29/17 0747 03/29/17 0749 03/29/17 0750 03/29/17 0945  BP:  100/84 100/84 103/62  Pulse:  83 83   Resp:      Temp:      TempSrc:      SpO2:  100% 100%   Weight: 65.4 kg (144 lb 2.9 oz)  65.4 kg (144 lb 1.6 oz)   Height:        Constitutional: weak appearing Eyes: lids and conjunctivae normal ENMT: Mucous membranes are  dry.  Respiratory: shallow breathing, no wheezing, no crackles. Weak respiratory effort Cardiovascular: irregular, no murmurs, 1-2+ LE edema. 2+ pedal pulses.  Abdomen: no tenderness. Bowel sounds positive.  Neurologic: non focal. Symmetric but weak   Data Reviewed: I have independently reviewed following labs and imaging studies  CBC:  Recent Labs Lab 03/26/17 1100 03/27/17 0308  WBC 7.4 7.2  NEUTROABS 6.2  --   HGB 9.5* 9.3*  HCT 33.2* 32.2*  MCV 98.5 97.6  PLT 278 245   Basic Metabolic Panel:  Recent Labs Lab 03/26/17 1100 03/27/17 0308 03/28/17 1016  NA 146* 146* 148*  K 4.9 4.5 3.4*  CL 103 100* 91*  CO2 37* 38* 48*  GLUCOSE 117* 90 105*  BUN 36* 38* 41*   CREATININE 1.10* 1.12* 1.24*  CALCIUM 8.2* 8.3* 8.5*   GFR: Estimated Creatinine Clearance: 25.8 mL/min (A) (by C-G formula based on SCr of 1.24 mg/dL (H)). Liver Function Tests:  Recent Labs Lab 03/26/17 1100 03/27/17 0308 03/28/17 1016  AST 27 26 24   ALT 19 18 15   ALKPHOS 62 58 63  BILITOT 0.9 1.1 0.8  PROT 7.0 6.8 6.8  ALBUMIN 3.2* 3.0* 3.0*   No results for input(s): LIPASE, AMYLASE in the last 168 hours. No results for input(s): AMMONIA in the last 168 hours. Coagulation Profile:  Recent Labs Lab 03/26/17 1100 03/27/17 0308 03/28/17 0240  INR 1.90 1.80 2.57   Cardiac Enzymes:  Recent Labs Lab 03/26/17 1100  TROPONINI 0.04*   BNP (last 3 results) No results for input(s): PROBNP in the last 8760 hours. HbA1C: No results for input(s): HGBA1C in the last 72 hours. CBG: No results for input(s): GLUCAP in the last 168 hours. Lipid Profile: No results for input(s): CHOL, HDL, LDLCALC, TRIG, CHOLHDL, LDLDIRECT in the last 72 hours. Thyroid Function Tests: No results for input(s): TSH, T4TOTAL, FREET4, T3FREE, THYROIDAB in the last 72 hours. Anemia Panel: No results for input(s): VITAMINB12, FOLATE, FERRITIN, TIBC, IRON, RETICCTPCT in the last 72 hours. Urine analysis:    Component Value Date/Time   COLORURINE YELLOW 03/26/2017 1027   APPEARANCEUR CLEAR 03/26/2017 1027   LABSPEC 1.009 03/26/2017 1027   PHURINE 5.0 03/26/2017 1027   GLUCOSEU NEGATIVE 03/26/2017 1027   HGBUR NEGATIVE 03/26/2017 1027   BILIRUBINUR NEGATIVE 03/26/2017 1027   KETONESUR NEGATIVE 03/26/2017 1027   PROTEINUR NEGATIVE 03/26/2017 1027   NITRITE NEGATIVE 03/26/2017 1027   LEUKOCYTESUR NEGATIVE 03/26/2017 1027   Sepsis Labs: Invalid input(s): PROCALCITONIN, LACTICIDVEN  Recent Results (from the past 240 hour(s))  MRSA PCR Screening     Status: None   Collection Time: 03/26/17  3:41 PM  Result Value Ref Range Status   MRSA by PCR NEGATIVE NEGATIVE Final    Comment:        The  GeneXpert MRSA Assay (FDA approved for NASAL specimens only), is one component of a comprehensive MRSA colonization surveillance program. It is not intended to diagnose MRSA infection nor to guide or monitor treatment for MRSA infections.   Culture, body fluid-bottle     Status: None (Preliminary result)   Collection Time: 03/27/17  5:07 PM  Result Value Ref Range Status   Specimen Description FLUID PLEURAL RIGHT  Final   Special Requests BOTTLES DRAWN AEROBIC AND ANAEROBIC  Final   Culture   Final    NO GROWTH 2 DAYS Performed at [facility], [address], [place], [place] [address]    Report Status PENDING  Incomplete  Gram stain  Status: None   Collection Time: 03/27/17  5:07 PM  Result Value Ref Range Status   Specimen Description FLUID PLEURAL RIGHT  Final   Special Requests NONE  Final   Gram Stain   Final    MODERATE WBC PRESENT,BOTH PMN AND MONONUCLEAR NO ORGANISMS SEEN Performed at [facility], [address] [place], [place] [address]    Report Status 03/27/2017 FINAL  Final      Radiology Studies: Dg Chest Port 1 View  Result Date: 03/27/2017 CLINICAL DATA:  Chest pain after RIGHT thoracentesis EXAM: PORTABLE CHEST 1 VIEW COMPARISON:  Chest radiograph 03/26/2017 FINDINGS: Reduction in volume of the RIGHT pleural effusion following thoracentesis. No clear pneumothorax identified in the RIGHT or LEFT hemithorax. Small bilateral pleural effusions remain. IMPRESSION: 1. No appreciable pneumothorax following thoracentesis 2. Reduction in volume of RIGHT pleural effusion. 3. Persistent bilateral small effusions Electronically Signed   By: Genevive Bi M.D.   On: 03/27/2017 17:21   US Thoracentesis Asp Pleural Space W/img Guide  Result Date: 03/27/2017 INDICATION: Patient with right pleural effusion. Request is made for diagnostic and therapeutic thoracentesis. EXAM: ULTRASOUND GUIDED DIAGNOSTIC AND THERAPEUTIC RIGHT THORACENTESIS MEDICATIONS: 10  mL 1% lidocaine COMPLICATIONS: None immediate. PROCEDURE: An ultrasound guided thoracentesis was thoroughly discussed with the patient and questions answered. The benefits, risks, alternatives and complications were also discussed. The patient understands and wishes to proceed with the procedure. Written consent was obtained. Ultrasound was performed to localize and mark an adequate pocket of fluid in the right chest. The area was then prepped and draped in the normal sterile fashion. 1% Lidocaine was used for local anesthesia. Under ultrasound guidance a Safe-T-Centesis catheter was introduced. Thoracentesis was performed. The catheter was removed and a dressing applied. FINDINGS: A total of approximately 520 mL of blood-tinged fluid was removed. Samples were sent to the laboratory as requested by the clinical team. IMPRESSION: Successful ultrasound guided diagnostic and therapeutic thoracentesis yielding 520 mL of pleural fluid. Read by:  Loyce Dys PA-C Electronically Signed   By: Richarda Overlie M.D.   On: 03/27/2017 17:12     Scheduled Meds: . atorvastatin  10 mg Oral QHS  . chlorhexidine  15 mL Mouth Rinse BID  . diltiazem  120 mg Oral Daily  . furosemide  40 mg Intravenous BID  . heparin  5,000 Units Subcutaneous Q8H  . magnesium oxide  200 mg Oral Daily  . mouth rinse  15 mL Mouth Rinse q12n4p  . metoprolol succinate  50 mg Oral BID WC  . multivitamin  15 mL Oral Daily   Continuous Infusions:  Pamella Pert, MD, PhD [facility] Pager [phone]  If 7PM-7AM, please contact night-coverage [email or web address] Password [facility] 03/29/2017, 12:25 PM

## 2017-03-29 NOTE — Progress Notes (Signed)
Received report from [employer] Pts assessment is unchanged from the morning. Will continue to monitor

## 2017-03-30 LAB — BASIC METABOLIC PANEL
Anion gap: 6 (ref 5–15)
BUN: 35 mg/dL — AB (ref 6–20)
CALCIUM: 8 mg/dL — AB (ref 8.9–10.3)
CO2: 46 mmol/L — ABNORMAL HIGH (ref 22–32)
CREATININE: 1.17 mg/dL — AB (ref 0.44–1.00)
Chloride: 92 mmol/L — ABNORMAL LOW (ref 101–111)
GFR calc Af Amer: 45 mL/min — ABNORMAL LOW (ref >60)
GFR, EST NON AFRICAN AMERICAN: 39 mL/min — AB (ref >60)
Glucose, Bld: 110 mg/dL — ABNORMAL HIGH (ref 65–99)
Potassium: 3.6 mmol/L (ref 3.5–5.1)
SODIUM: 144 mmol/L (ref 135–145)

## 2017-03-30 LAB — PROTIME-INR
INR: 1.93
PROTHROMBIN TIME: 22.3 s — AB (ref 11.4–15.2)

## 2017-03-30 NOTE — Care Management Important Message (Signed)
Important Message  Patient Details  Name: Emily Elliott MRN: [id number] Date of Birth: Jan 05, 1921   Medicare Important Message Given:  Yes    Caren Macadam 03/30/2017, 10:27 AMImportant Message  Patient Details  Name: Emily Elliott MRN: [id number] Date of Birth: Jul 22, 1921   Medicare Important Message Given:  Yes    Caren Macadam 03/30/2017, 10:27 AM

## 2017-03-30 NOTE — Care Management Note (Signed)
Case Management Note  Patient Details  Name: Emily Elliott MRN: [id number] Date of Birth: [birth year]  Subjective/Objective:    Palliative care and snf placment/pna                Action/Plan: Date:  March 30, 2017  Chart reviewed for concurrent status and case management needs.  Will continue to follow patient progress.  Discharge Planning: following for needs  Expected discharge date: [id number]  Marcelle Smiling, BSN, [place], [place]   [phone]   Expected Discharge Date:                  Expected Discharge Plan:  Home/Self Care  In-House Referral:     Discharge planning Services  CM Consult  Post Acute Care Choice:    Choice offered to:     DME Arranged:    DME Agency:     HH Arranged:    HH Agency:     Status of Service:  In process, will continue to follow  If discussed at Long Length of Stay Meetings, dates discussed:    Additional Comments:  Golda Acre, RN 03/30/2017, 9:41 AM

## 2017-03-30 NOTE — Progress Notes (Signed)
PROGRESS NOTE  Onedia Vargus ZOX:[id number] DOB: 04-28-1921 DOA: 03/26/2017 PCP: Patient, No Pcp Per   LOS: 4 days   Brief Narrative / Interim history: Sreeja Spies is an 83 y.o. female past medical history of coronary artery disease, diastolic congestive heart failure with an EF of 65%, and pulmonary hypertension, stage III chronic kidney disease with baseline creatinine 1.1-1.3, recently discharged from the hospital for acute respiratory failure with hypoxia due to large pleural effusion possible aspiration comes in for shortness of breath,  Assessment & Plan: Active Problems:   Atrial fibrillation, chronic (HCC)   Essential hypertension   Hypernatremia   Pleural effusion, right   CKD (chronic kidney disease), stage III   Acute respiratory failure with hypoxia and hypercapnia (HCC)   Respiratory failure (HCC)   Goals of care, counseling/discussion   Palliative care encounter   Acute on chronic diastolic congestive heart failure (HCC)   Acute respiratory failure with hypoxia and hypercapnia (HCC)Due to acute decompensated diastolic heart failure/Pleural effusion, right: -s/p paracentesis 8/20 with removal of ~500cc of fluid. Cytology not sent unfortunately -keep on IV Lasix today, transition to po tomorrow   Acute encephalopathy -Likely due to hypercarbia, resolved for most part  -improved some today, she is more alert  Atrial fibrillation, chronic (HCC) -Resume coumadin diltiazem and metoprolol. -cardiology recommends to hold anticoagulation due to underlying co morbidities. -d/w Dr. Jacinto Halim again today, hold off Coumadin as will transition towards comfort in the near future  Essential hypertension: -Blood pressure was controlled continue current regimen.  Hypernatremia -improving with Lasix, continue IV for today  CKD (chronic kidney disease), stage III -stable  Respiratory acidosis -Resolved  DVT prophylaxis: Coumadin Code Status: DNR Family Communication:  daughter bedside Disposition Plan: SNF Friday  Consultants:   Cardiology   IR  Procedures:   Thoracentesis 8/20  Antimicrobials:  None    Subjective: -feels weak but talking more today. No complaints. Breathing is OK. No chest pain   Objective: Vitals:   03/29/17 0945 03/29/17 1306 03/29/17 2025 03/30/17 0617  BP: 103/62 137/74 100/60 (!) 157/85  Pulse:  82 66 (!) 112  Resp:  18 18 16   Temp:  97.7 F (36.5 C) 97.7 F (36.5 C) 99.1 F (37.3 C)  TempSrc:  Oral Oral Oral  SpO2:  99% 100% 94%  Weight:      Height:        Intake/Output Summary (Last 24 hours) at 03/30/17 1044 Last data filed at 03/30/17 0918  Gross per 24 hour  Intake              360 ml  Output              800 ml  Net             -440 ml   Filed Weights   03/26/17 1552 03/29/17 0747 03/29/17 0750  Weight: 70 kg (154 lb 5.2 oz) 65.4 kg (144 lb 2.9 oz) 65.4 kg (144 lb 1.6 oz)    Examination:  Vitals:   03/29/17 0945 03/29/17 1306 03/29/17 2025 03/30/17 0617  BP: 103/62 137/74 100/60 (!) 157/85  Pulse:  82 66 (!) 112  Resp:  18 18 16   Temp:  97.7 F (36.5 C) 97.7 F (36.5 C) 99.1 F (37.3 C)  TempSrc:  Oral Oral Oral  SpO2:  99% 100% 94%  Weight:      Height:        Constitutional: NAD Eyes: lids and conjunctivae normal Respiratory: clear to auscultation  bilaterally, no wheezing, no crackles. Weak respiratory effort.  Cardiovascular: Regular rate and rhythm, no murmurs / rubs / gallops. Trace LE edema. 2+ pedal pulses.  Abdomen: no tenderness. Bowel sounds positive.  Skin: no rashes, lesions, ulcers. No induration Neurologic: non focal  Data Reviewed: I have independently reviewed following labs and imaging studies  CBC:  Recent Labs Lab 03/26/17 1100 03/27/17 0308  WBC 7.4 7.2  NEUTROABS 6.2  --   HGB 9.5* 9.3*  HCT 33.2* 32.2*  MCV 98.5 97.6  PLT 278 245   Basic Metabolic Panel:  Recent Labs Lab 03/26/17 1100 03/27/17 0308 03/28/17 1016 03/29/17 1243  03/30/17 0918  NA 146* 146* 148* 146* 144  K 4.9 4.5 3.4* 3.3* 3.6  CL 103 100* 91* 91* 92*  CO2 37* 38* 48* >50* 46*  GLUCOSE 117* 90 105* 96 110*  BUN 36* 38* 41* 36* 35*  CREATININE 1.10* 1.12* 1.24* 1.00 1.17*  CALCIUM 8.2* 8.3* 8.5* 8.0* 8.0*   GFR: Estimated Creatinine Clearance: 27.3 mL/min (A) (by C-G formula based on SCr of 1.17 mg/dL (H)). Liver Function Tests:  Recent Labs Lab 03/26/17 1100 03/27/17 0308 03/28/17 1016  AST 27 26 24   ALT 19 18 15   ALKPHOS 62 58 63  BILITOT 0.9 1.1 0.8  PROT 7.0 6.8 6.8  ALBUMIN 3.2* 3.0* 3.0*   No results for input(s): LIPASE, AMYLASE in the last 168 hours. No results for input(s): AMMONIA in the last 168 hours. Coagulation Profile:  Recent Labs Lab 03/26/17 1100 03/27/17 0308 03/28/17 0240 03/29/17 1243 03/30/17 0918  INR 1.90 1.80 2.57 2.27 1.93   Cardiac Enzymes:  Recent Labs Lab 03/26/17 1100  TROPONINI 0.04*   BNP (last 3 results) No results for input(s): PROBNP in the last 8760 hours. HbA1C: No results for input(s): HGBA1C in the last 72 hours. CBG: No results for input(s): GLUCAP in the last 168 hours. Lipid Profile: No results for input(s): CHOL, HDL, LDLCALC, TRIG, CHOLHDL, LDLDIRECT in the last 72 hours. Thyroid Function Tests: No results for input(s): TSH, T4TOTAL, FREET4, T3FREE, THYROIDAB in the last 72 hours. Anemia Panel: No results for input(s): VITAMINB12, FOLATE, FERRITIN, TIBC, IRON, RETICCTPCT in the last 72 hours. Urine analysis:    Component Value Date/Time   COLORURINE YELLOW 03/26/2017 1027   APPEARANCEUR CLEAR 03/26/2017 1027   LABSPEC 1.009 03/26/2017 1027   PHURINE 5.0 03/26/2017 1027   GLUCOSEU NEGATIVE 03/26/2017 1027   HGBUR NEGATIVE 03/26/2017 1027   BILIRUBINUR NEGATIVE 03/26/2017 1027   KETONESUR NEGATIVE 03/26/2017 1027   PROTEINUR NEGATIVE 03/26/2017 1027   NITRITE NEGATIVE 03/26/2017 1027   LEUKOCYTESUR NEGATIVE 03/26/2017 1027   Sepsis Labs: Invalid input(s):  PROCALCITONIN, LACTICIDVEN  Recent Results (from the past 240 hour(s))  MRSA PCR Screening     Status: None   Collection Time: 03/26/17  3:41 PM  Result Value Ref Range Status   MRSA by PCR NEGATIVE NEGATIVE Final    Comment:        The GeneXpert MRSA Assay (FDA approved for NASAL specimens only), is one component of a comprehensive MRSA colonization surveillance program. It is not intended to diagnose MRSA infection nor to guide or monitor treatment for MRSA infections.   Culture, body fluid-bottle     Status: None (Preliminary result)   Collection Time: 03/27/17  5:07 PM  Result Value Ref Range Status   Specimen Description FLUID PLEURAL RIGHT  Final   Special Requests BOTTLES DRAWN AEROBIC AND ANAEROBIC  Final   Culture  Final    NO GROWTH 2 DAYS Performed at [facility], [address], [place], [place] [address]    Report Status PENDING  Incomplete  Gram stain     Status: None   Collection Time: 03/27/17  5:07 PM  Result Value Ref Range Status   Specimen Description FLUID PLEURAL RIGHT  Final   Special Requests NONE  Final   Gram Stain   Final    MODERATE WBC PRESENT,BOTH PMN AND MONONUCLEAR NO ORGANISMS SEEN Performed at [facility], [address] [place], [place] [address]    Report Status 03/27/2017 FINAL  Final      Radiology Studies: No results found.   Scheduled Meds: . chlorhexidine  15 mL Mouth Rinse BID  . diltiazem  120 mg Oral Daily  . furosemide  40 mg Intravenous BID  . heparin  5,000 Units Subcutaneous Q8H  . magnesium oxide  200 mg Oral Daily  . mouth rinse  15 mL Mouth Rinse q12n4p  . metoprolol succinate  50 mg Oral BID WC  . multivitamin  15 mL Oral Daily   Continuous Infusions:  Pamella Pert, MD, PhD [facility] Pager [phone]  If 7PM-7AM, please contact night-coverage [email or web address] Password [facility] 03/30/2017, 10:44 AM

## 2017-03-30 NOTE — Progress Notes (Signed)
CSW met with patient daughter at bedside, Discuss plan  for the patient to return to [facility] w/ palliative following at discharge. CSW confirmed with facility, patient able to return w/ palliative following.  Updated FL2 faxed.     , LCSWA, MSW Clinical Social Worker 5E and Psychiatric Service Line [phone] 03/30/2017  10:29 AM 

## 2017-03-30 NOTE — Progress Notes (Signed)
Subjective:  Daughter percent at the bedside, is much more sedated today.  Patient states she is very tired.  Objective:  Vital Signs in the last 24 hours: Temp:  [97.7 F (36.5 C)] 97.7 F (36.5 C) (08/22 2025) Pulse Rate:  [66-83] 66 (08/22 2025) Resp:  [18] 18 (08/22 2025) BP: (100-137)/(60-84) 100/60 (08/22 2025) SpO2:  [99 %-100 %] 100 % (08/22 2025) Weight:  [65.4 kg (144 lb 1.6 oz)-65.4 kg (144 lb 2.9 oz)] 65.4 kg (144 lb 1.6 oz) (08/22 0750)  Intake/Output from previous day: 08/22 0701 - 08/23 0700 In: 220 [P.O.:220] Out: 800 [Urine:800]  Physical Exam: General appearance: appears stated age, fatigued, no distress and slowed mentation Lungs: Bilateral basal coarse crackles present left worse on the right. Decreased breath sounds Heart: S1 is variable, S2 is normal. 2/6 midsystolic murmur in the tricuspid area. No JVD  Abdomen: soft, non-tender; bowel sounds normal; no masses,  no organomegaly and Obese Ileostomy bag left iliac fossa noted and appears healthy. Extremities: edema trace  + Pulses: Carotid pulse normal, femoral pulse normal, popliteal pulse., Absent pedal pulses.  Lab Results: BMP  Recent Labs  03/27/17 0308 03/28/17 1016 03/29/17 1243  NA 146* 148* 146*  K 4.5 3.4* 3.3*  CL 100* 91* 91*  CO2 38* 48* >50*  GLUCOSE 90 105* 96  BUN 38* 41* 36*  CREATININE 1.12* 1.24* 1.00  CALCIUM 8.3* 8.5* 8.0*  GFRNONAA 41* 36* 47*  GFRAA 48* 42* 55*    CBC  Recent Labs Lab 03/26/17 1100 03/27/17 0308  WBC 7.4 7.2  RBC 3.37* 3.30*  HGB 9.5* 9.3*  HCT 33.2* 32.2*  PLT 278 245  MCV 98.5 97.6  MCH 28.2 28.2  MCHC 28.6* 28.9*  RDW 17.1* 16.7*  LYMPHSABS 0.8  --   MONOABS 0.3  --   EOSABS 0.1  --   BASOSABS 0.0  --     Recent Labs  03/26/17 1100  TROPONINI 0.04*   Recent Labs  03/14/17 1010  TSH 2.077    Recent Labs  03/26/17 1100 03/27/17 0308 03/28/17 1016  PROT 7.0 6.8 6.8  ALBUMIN 3.2* 3.0* 3.0*  AST 27 26 24   ALT 19 18 15    ALKPHOS 62 58 63  BILITOT 0.9 1.1 0.8  BILIDIR  --   --  0.2  IBILI  --   --  0.6    Imaging: Imaging results have been reviewed  Cardiac Studies:  EKG 03/26/2017: Atrial fibrillation with controlled ventricular response at the rate of 90 beats a minute, normal axis, poor progression, cannot exclude anterior infarct old. LVH with repolarization abnormality, cannot exclude lateral wall ischemia.  Echo: 03/14/2017: Hyperdynamic LV, EF 65-70%, unable to evaluate diastolic function, mildly dilated left atrium, right ventricle mildly dilated with low normal RV systolic function, moderate TR, moderate to severe pulmonary hypertension.  Assessment/Plan:  1. Acute on chronic diastolic CHF. 2. Chronic A. Fib rate controlled  CHA2DS2-VASCScore: Risk Score  4,  Yearly risk of stroke  4. 3. Chronic renal failure stage 3 CKD. Stable 4. Failure to thrive in an adult.  Recommendation: I had a long discussion with the 2 daughters, we discussed palliative care, discussed regarding anticoagulation for atrial fibrillation, and very guarded prognosis.  Her breathing has improved significantly since pleurocentesis.  Continue percent dose of diuretics.  After long discussions, we do agree that she needs to be DO NOT RESUSCITATE and palliative care, I will discontinue Lipitor, no need for further anticoagulation and therapy is directed  towards comfort measures only.  Both daughters are comfortable with the decision.  Please call me if he have any further questions.  Hypernatremia is probably a combination of aggressive diuresis and also poor oral intake.  I have also discussed with the patient's family regarding tube feeds, IV fluids, however this is futile and they are in agreement.   Yates Decamp, M.D. 03/30/2017, 6:26 AM [facility] Pager: [phone] Office: [phone] If no answer: [phone]

## 2017-03-30 NOTE — Progress Notes (Signed)
Daily Progress Note   Patient Name: Emily Elliott       Date: 03/30/2017 DOB: Nov 15, 1920  Age: 83 y.o. MRN#: [id number] Attending Physician: Leatha Gilding, MD Primary Care Physician: Patient, No Pcp Per Admit Date: 03/26/2017  Reason for Consultation/Follow-up: Establishing goals of care  Subjective: Sitting in bed this morning. No complaints. Spoke with daughter who is present. Per daughter, the current plan is to proceed to [facility] tomorrow and not be brought back to the hospital. MOST form started and patient made DNR with plans for no return to the hospital. She states she will need to speak to other family members to discuss IV fluid/ tube feeds, and antibiotics as they do not want her brought back to the hospital, but would like to have discussion so they are on the same page before documenting the two decisions on the form.   Please document in d/c summary recommendation for palliative to follow at SNF.   8/22: Today, the daughters state there is a son who is a doctor that is the legal guardian for the patient. Spoke with him via telephone to inquire about code status and GOC planning, he is aware we spoke with his sisters and he states " that is up to them, I do not want anything to do with it."    Length of Stay: 4  Current Medications: Scheduled Meds:  . chlorhexidine  15 mL Mouth Rinse BID  . diltiazem  120 mg Oral Daily  . furosemide  40 mg Intravenous BID  . heparin  5,000 Units Subcutaneous Q8H  . magnesium oxide  200 mg Oral Daily  . mouth rinse  15 mL Mouth Rinse q12n4p  . metoprolol succinate  50 mg Oral BID WC  . multivitamin  15 mL Oral Daily     PRN Meds: polyvinyl alcohol  Physical Exam  Constitutional: No distress.  Sitting up bed.   HENT:    Head: Normocephalic and atraumatic.  Eyes: EOM are normal.  Cardiovascular:  Warm and dry  Pulmonary/Chest: Effort normal.  Musculoskeletal: She exhibits no edema.  Neurological: She is alert.  Skin: No erythema.  Psychiatric: Her behavior is normal.            Vital Signs: BP (!) 157/85 (BP Location: Right Arm)   Pulse (!) 112  Temp 99.1 F (37.3 C) (Oral)   Resp 16   Ht 5\' 3"  (1.6 m)   Wt 65.4 kg (144 lb 1.6 oz)   SpO2 94%   BMI 25.53 kg/m  SpO2: SpO2: 94 % O2 Device: O2 Device: Nasal Cannula O2 Flow Rate: O2 Flow Rate (L/min): 2.5 L/min  Intake/output summary:   Intake/Output Summary (Last 24 hours) at 03/30/17 1314 Last data filed at 03/30/17 1610  Gross per 24 hour  Intake              360 ml  Output              800 ml  Net             -440 ml   LBM: Last BM Date: 03/29/17 Baseline Weight: Weight: 70 kg (154 lb 5.2 oz) Most recent weight: Weight: 65.4 kg (144 lb 1.6 oz)       Palliative Assessment/Data: 20%      Patient Active Problem List   Diagnosis Date Noted  . Acute on chronic diastolic congestive heart failure (HCC)   . Goals of care, counseling/discussion   . Palliative care encounter   . Respiratory failure (HCC) 03/26/2017  . Acute CHF (congestive heart failure) (HCC) 03/14/2017  . Coagulopathy (HCC) 03/14/2017  . Atrial fibrillation, chronic (HCC) 03/13/2017  . Essential hypertension 03/13/2017  . Hypernatremia 03/13/2017  . AKI (acute kidney injury) (HCC) 03/13/2017  . Acute encephalopathy 03/13/2017  . UTI (urinary tract infection) 03/13/2017  . History of colon cancer 03/13/2017  . Pleural effusion, right 03/13/2017  . Dysphagia 03/13/2017  . CKD (chronic kidney disease), stage III 03/13/2017  . Acute respiratory failure with hypoxia and hypercapnia (HCC)   . Foot pain, bilateral 05/10/2016    Palliative Care Assessment & Plan   Patient Profile: is a 83 y.o.femalewith She has history of chronic atrial fibrillation, chronic  diastolic heart failure, stage III chronic kidney disease and chronic venous stasis lower extremity edema.     Assessment: Ms. Rohm is a frail woman who would like to continue current treatment and physical rehab for strengthening, but no return to the hospital.  Recommendations/Plan:  Placement to [facility] tomorrow with palliative to follow and likely transition to hospice.   Goals of Care and Additional Recommendations:  DNR  Code Status:    Code Status Orders        Start     Ordered   03/27/17 1740  Do not attempt resuscitation (DNR)  Continuous    Question Answer Comment  In the event of cardiac or respiratory ARREST Do not call a "code blue"   In the event of cardiac or respiratory ARREST Do not perform Intubation, CPR, defibrillation or ACLS   In the event of cardiac or respiratory ARREST Use medication by any route, position, wound care, and other measures to relive pain and suffering. May use oxygen, suction and manual treatment of airway obstruction as needed for comfort.      03/27/17 1739    Code Status History    Date Active Date Inactive Code Status Order ID Comments User Context   03/26/2017  2:05 PM 03/27/2017  5:39 PM Full Code [id number]  Alwyn Ren, MD ED   03/13/2017  9:19 PM 03/21/2017  4:45 PM Full Code [id number]  Briscoe Deutscher, MD ED    Advance Directive Documentation     Most Recent Value  Type of Advance Directive  Healthcare Power of Attorney  Pre-existing  out of facility DNR order (yellow form or pink MOST form)  -  "MOST" Form in Place?  -       Prognosis:   < 2 weeks depending on reaccumilation of pleural fluid.   Discharge Planning:  SNF when medically appropriate with palliative care. May transition to Hospice when patient/ family decides.   Thank you for allowing the Palliative Medicine Team to assist in the care of this patient.   Total Time 1 hour Prolonged Time Billed  No      Greater than 50%  of this time was  spent counseling and coordinating care related to the above assessment and plan.   Please contact Palliative Medicine Team phone at [phone] for questions and concerns.

## 2017-03-30 NOTE — [place] FL2 (Signed)
[place] MEDICAID FL2 LEVEL OF CARE SCREENING TOOL     IDENTIFICATION  Patient Name: Emily Elliott Birthdate: Aug 07, 1921 Sex: female Admission Date (Current Location): 03/26/2017  [facility] and [place] Number:  Producer, television/film/video and Address:  [facility],  [address] [place] [address]      Provider Number: [id number]  Attending Physician Name and Address:  Leatha Gilding, MD  Relative Name and Phone Number:       Current Level of Care: Hospital Recommended Level of Care: Nursing Facility Prior Approval Number:    Date Approved/Denied:   PASRR Number:   [id number]  Discharge Plan: SNF    Current Diagnoses: Patient Active Problem List   Diagnosis Date Noted  . Acute on chronic diastolic congestive heart failure (HCC)   . Goals of care, counseling/discussion   . Palliative care encounter   . Respiratory failure (HCC) 03/26/2017  . Acute CHF (congestive heart failure) (HCC) 03/14/2017  . Coagulopathy (HCC) 03/14/2017  . Atrial fibrillation, chronic (HCC) 03/13/2017  . Essential hypertension 03/13/2017  . Hypernatremia 03/13/2017  . AKI (acute kidney injury) (HCC) 03/13/2017  . Acute encephalopathy 03/13/2017  . UTI (urinary tract infection) 03/13/2017  . History of colon cancer 03/13/2017  . Pleural effusion, right 03/13/2017  . Dysphagia 03/13/2017  . CKD (chronic kidney disease), stage III 03/13/2017  . Acute respiratory failure with hypoxia and hypercapnia (HCC)   . Foot pain, bilateral 05/10/2016    Orientation RESPIRATION BLADDER Height & Weight     Self  O2 (2.5L) Continent, External catheter Weight: 144 lb 1.6 oz (65.4 kg) Height:  5\' 3"  (160 cm)  BEHAVIORAL SYMPTOMS/MOOD NEUROLOGICAL BOWEL NUTRITION STATUS      Incontinent  (Dysphagia 1)  AMBULATORY STATUS COMMUNICATION OF NEEDS Skin   Extensive Assist Verbally Other (Comment), Skin abrasions, Bruising                       Personal Care Assistance Level of Assistance   Bathing, Feeding, Dressing Bathing Assistance: Maximum assistance Feeding assistance: Limited assistance Dressing Assistance: Maximum assistance     Functional Limitations Info  Sight, Hearing, Speech Sight Info: Impaired Hearing Info: Impaired Speech Info: Adequate    SPECIAL CARE FACTORS FREQUENCY        PT Frequency: 3X a Week  OT Frequency: 3X a Week            Contractures Contractures Info: Not present    Additional Factors Info  Code Status, Allergies Code Status Info: DNR Allergies Info: Benadryl Diphenhydramine            Current Medications (03/30/2017):  This is the current hospital active medication list Current Facility-Administered Medications  Medication Dose Route Frequency Provider Last Rate Last Dose  . chlorhexidine (PERIDEX) 0.12 % solution 15 mL  15 mL Mouth Rinse BID Alwyn Ren, MD   15 mL at 03/29/17 2118  . diltiazem (CARDIZEM CD) 24 hr capsule 120 mg  120 mg Oral Daily Marinda Elk, MD   120 mg at 03/29/17 0946  . furosemide (LASIX) injection 40 mg  40 mg Intravenous BID Marinda Elk, MD   40 mg at 03/29/17 1724  . heparin injection 5,000 Units  5,000 Units Subcutaneous Q8H Yates Decamp, MD   5,000 Units at 03/30/17 0610  . magnesium oxide (MAG-OX) tablet 200 mg  200 mg Oral Daily Marinda Elk, MD   200 mg at 03/29/17 0946  . MEDLINE mouth rinse  15 mL Mouth Rinse q12n4p Alwyn Ren, MD   15 mL at 03/29/17 1627  . metoprolol succinate (TOPROL-XL) 24 hr tablet 50 mg  50 mg Oral BID WC Marinda Elk, MD   50 mg at 03/29/17 1626  . multivitamin liquid 15 mL  15 mL Oral Daily Marinda Elk, MD   15 mL at 03/29/17 0945  . polyvinyl alcohol (LIQUIFILM TEARS) 1.4 % ophthalmic solution 2 drop  2 drop Both Eyes TID PRN Alwyn Ren, MD   2 drop at 03/26/17 1647     Discharge Medications: Please see discharge summary for a list of discharge medications.  Relevant Imaging  Results:  Relevant Lab Results:   Additional Information SSN:  [id number]    Clearance Coots, LCSW

## 2017-03-31 LAB — PROTIME-INR
INR: 1.52
Prothrombin Time: 18.4 seconds — ABNORMAL HIGH (ref 11.4–15.2)

## 2017-03-31 MED ORDER — FUROSEMIDE 40 MG PO TABS: 40.0000 mg | ORAL_TABLET | Freq: Every day | ORAL | Status: DC

## 2017-03-31 NOTE — Progress Notes (Signed)
Date: March 31, 2017 Chart reviewed for discharge orders: None found for case management. Rhonda Davis,BSN,RN3, CCM/[phone] 

## 2017-03-31 NOTE — Progress Notes (Addendum)
CSW sent d/c summary to SNF.  Med Nes. Completed 2.5 L 02 Patient going to Room:104A. Facility will be ready at noon. Patient will transport by EMS. 12:20pm [facility] Called for transport. Daughter informed.   Vivi Barrack, Theresia Majors, MSW Clinical Social Worker 5E and Psychiatric Service Line [phone] 03/31/2017  9:32 AM

## 2017-03-31 NOTE — Discharge Summary (Signed)
Physician Discharge Summary  Emily Elliott WUJ:[id number] DOB: [birth year] DOA: 03/26/2017  PCP: Patient, No Pcp Per  Admit date: 03/26/2017 Discharge date: 03/31/2017  Admitted From: SNF Disposition:  SNF  Recommendations for Outpatient Follow-up:  1. Please obtain BMP/CBC in one week 2. Please palliative care follow up at SNF  Home Health: none Equipment/Devices: none  Discharge Condition: stable CODE STATUS: DNR Diet recommendation: Dysphagia 1 with nectar thick  HPI: Per Dr. Ashley Royalty, Emily Elliott is a 83 y.o. female with complex medical history including coronary artery disease congestive heart failure, stage III chronic kidney disease, readmitted with increasing shortness of breath. Patient's oxygen saturation was in the 80s on room air. She was placed on a nonrebreather mask. Patient was recently discharged from here after diagnosis with diagnosing with hypoxic hypercapnic respiratory failure due to large right pleural effusion and possible aspiration. She had a thoracentesis done she was treated with Rocephin and azithromycin. And then she was discharged to a skilled nursing facility and then she was discharged home. History is obtained from the caregiver who is also patient's daughter. Patient does have a recent history of urinary tract infection. The daughter reports patient's dry weight is 140 pounds. In the rehabilitation her weight was 155 pounds. She denied any fever chills cough nausea vomiting. Patient is incontinent of urine. Patient does have a colostomy bag. Patient is followed by nephrologist, cardiologist. ED Cour se: In the ER she was found to be in respiratory acidosis with a pH of 7.2 and a CO2 of 85. She was placed on BiPAP. Chest x-ray showed bilateral pleural effusion.   Hospital Course: Discharge Diagnoses:  Active Problems:   Atrial fibrillation, chronic (HCC)   Essential hypertension   Hypernatremia   Pleural effusion, right   CKD (chronic kidney  disease), stage III   Acute respiratory failure with hypoxia and hypercapnia (HCC)   Respiratory failure (HCC)   Goals of care, counseling/discussion   Palliative care encounter   Acute on chronic diastolic congestive heart failure (HCC)   Acute respiratory failure with hypoxia and hypercapnia (HCC)Due to acute decompensated diastolic heart failure/Pleural effusion, right -s/p paracentesis 8/20 with removal of ~500cc of fluid. Cytology not sent with the tap. Patient was diuresed with IV Lasix and has been net negative 7.5 L. Cardiology was consulted and have followed patient while hospitalized. Reocmmendations were for medical management with palliative care involvement since medical options may be limited. Her respiratory status is improved, she will be transitioned to po Lasix.  Goals of care -patient with deconditioning/weakness/somnolence in the setting of acute diastolic CHF with recurrent pleural effusion. Palliative was consulted while patient was hospitalized. She is now a DNR. Per palliative discussions with the family, MOST form is in process "Per daughter, the current plan is to proceed to [facility] (Friday) and not be brought back to the hospital. MOST form started and patient made DNR with plans for no return to the hospital. She states she will need to speak to other family members to discuss IV fluid/ tube feeds, and antibiotics as they do not want her brought back to the hospital, but would like to have discussion so they are on the same page before documenting the two decisions on the form" Acute encephalopathy -Likely due to hypercarbia, resolved Atrial fibrillation, chronic (HCC) -Resume diltiazem and metoprolol. Cardiology discussed with patient/family risks/benefits of anticoagulation and it was decided to discontinue Coumadin at this juncture.  Essential hypertension -Blood pressure was controlled continue current regimen. Hypernatremia -improved  CKD (chronic kidney  disease), stage III -stable Respiratory acidosis -Resolved   Discharge Instructions   Allergies as of 03/31/2017      Reactions   Benadryl [diphenhydramine] Rash      Medication List    STOP taking these medications   COUMADIN 2 MG tablet Generic drug:  warfarin   hydrochlorothiazide 12.5 MG capsule Commonly known as:  MICROZIDE   warfarin 1 MG tablet Commonly known as:  COUMADIN     TAKE these medications   atorvastatin 10 MG tablet Commonly known as:  LIPITOR Take 10 mg by mouth at bedtime.   COD LIVER OIL/LOW VITAMIN A Caps Take 1 capsule by mouth daily.   cyanocobalamin 500 MCG tablet Take 500 mcg by mouth daily.   diltiazem 120 MG 24 hr capsule Commonly known as:  DILACOR XR Take 120 mg by mouth daily.   ferrous sulfate 324 (65 Fe) MG Tbec Take 1 tablet by mouth daily.   furosemide 40 MG tablet Commonly known as:  LASIX Take 1 tablet (40 mg total) by mouth daily. What changed:  when to take this  reasons to take this   Magnesium 250 MG Tabs Take 250 mg by mouth daily.   metoprolol succinate 50 MG 24 hr tablet Commonly known as:  TOPROL-XL Take 50 mg by mouth 2 (two) times daily. Take with or immediately following a meal.   NUTRITIONAL SUPPLEMENT PLUS Liqd Take 15 mLs by mouth daily.   vitamin C 1000 MG tablet Take 1,000 mg by mouth daily.   Vitamin D3 5000 units Caps Take 5,000 Units by mouth daily.            Discharge Care Instructions        Start     Ordered   03/31/17 0000  furosemide (LASIX) 40 MG tablet  Daily     03/31/17 0915     Contact information for after-discharge care    Destination    [facility] Follow up.   Specialty:  Skilled Nursing Facility Contact information: [address] [place] [address] [phone]             Allergies  Allergen Reactions  . Benadryl [Diphenhydramine] Rash    Consultations:  Cardiology   Palliative  care  Procedures/Studies:  Ct Abdomen Pelvis Wo Contrast  Result Date: 03/13/2017 CLINICAL DATA:  Abdominal distention and pain. EXAM: CT ABDOMEN AND PELVIS WITHOUT CONTRAST TECHNIQUE: Multidetector CT imaging of the abdomen and pelvis was performed following the standard protocol without IV contrast. COMPARISON:  None. FINDINGS: Lower chest: Large right pleural effusion is noted with adjacent subsegmental atelectasis. Hepatobiliary: No gallstones are noted. No focal abnormality is noted in the liver. Pancreas: Unremarkable. No pancreatic ductal dilatation or surrounding inflammatory changes. Spleen: Normal in size without focal abnormality. Adrenals/Urinary Tract: Adrenal glands are unremarkable. Large left renal cysts are noted. No hydronephrosis or renal obstruction is noted. Urinary bladder is unremarkable. Stomach/Bowel: Stomach is unremarkable. Colostomy is noted in left lower quadrant. Diverticulosis is noted throughout the colon without inflammation. Vascular/Lymphatic: Aortic atherosclerosis. No enlarged abdominal or pelvic lymph nodes. Reproductive: Uterus and bilateral adnexa are unremarkable. Other: Mild amount of free fluid is noted in the pelvis. Fluid is also noted around the liver and spleen. Musculoskeletal: No acute or significant osseous findings. IMPRESSION: Large right pleural effusion with adjacent subsegmental atelectasis. Large left renal cysts. Colostomy seen in left lower quadrant. Aortic atherosclerosis. Mild ascites is noted. Electronically Signed   By: Fayrene Fearing  Christen Butter, M.D.   On: 03/13/2017 20:25   Dg Chest 1 View  Result Date: 03/14/2017 CLINICAL DATA:  Status post thoracentesis EXAM: CHEST 1 VIEW COMPARISON:  Chest radiograph 03/13/2017 FINDINGS: Status post right thoracentesis with decreased amount of pleural fluid. Right basilar opacities may indicate a degree of re-expansion pulmonary edema. No pneumothorax. Unchanged small left pleural effusion and associated atelectasis.  IMPRESSION: Decreased size of right pleural effusion without post thoracentesis pneumothorax. Electronically Signed   By: Deatra Robinson M.D.   On: 03/14/2017 16:20   Dg Chest 2 View  Result Date: 03/13/2017 CLINICAL DATA:  Hypoxia, possible UTI, history hypertension, coronary artery disease, atrial fibrillation EXAM: CHEST  2 VIEW COMPARISON:  None FINDINGS: Very low lung volumes. Borderline enlargement of cardiac silhouette. Mediastinal contours normal. RIGHT pleural effusion and basilar atelectasis versus infiltrate. Minimal atelectasis at LEFT base. Mild central peribronchial thickening. No pneumothorax. Bones demineralized. IMPRESSION: Bronchitic changes with low lung volumes and mild LEFT basilar atelectasis. RIGHT pleural effusion with atelectasis versus consolidation at lower RIGHT lung. Electronically Signed   By: Ulyses Southward M.D.   On: 03/13/2017 18:15   Ct Head Wo Contrast  Result Date: 03/13/2017 CLINICAL DATA:  Altered level of consciousness. Urinary symptoms for 1 week. History of hypertension and atrial fibrillation. EXAM: CT HEAD WITHOUT CONTRAST TECHNIQUE: Contiguous axial images were obtained from the base of the skull through the vertex without intravenous contrast. COMPARISON:  None. FINDINGS: BRAIN: No intraparenchymal hemorrhage, mass effect nor midline shift. The ventricles and sulci are normal for age. Patchy supratentorial white matter hypodensities within normal range for patient's age, though non-specific are most compatible with chronic small vessel ischemic disease. Old bilateral basal ganglia and thalamus lacunar infarcts. Symmetric basal ganglia mineralization. No acute large vascular territory infarcts. No abnormal extra-axial fluid collections. Basal cisterns are patent. VASCULAR: Moderate calcific atherosclerosis of the carotid siphons. 7 mm LEFT carotid terminus aneurysm. SKULL: No skull fracture. No significant scalp soft tissue swelling. SINUSES/ORBITS: The mastoid air-cells  and included paranasal sinuses are well-aerated.The included ocular globes and orbital contents are non-suspicious. OTHER: None. IMPRESSION: 1. No acute intracranial process. 2. Moderate chronic small vessel ischemic disease and old lacunar infarcts. 3. 7 mm LEFT carotid terminus aneurysm would be better assessed on CT or MR angiogram on nonemergent basis. Electronically Signed   By: Awilda Metro M.D.   On: 03/13/2017 20:22   Dg Chest Port 1 View  Result Date: 03/27/2017 CLINICAL DATA:  Chest pain after RIGHT thoracentesis EXAM: PORTABLE CHEST 1 VIEW COMPARISON:  Chest radiograph 03/26/2017 FINDINGS: Reduction in volume of the RIGHT pleural effusion following thoracentesis. No clear pneumothorax identified in the RIGHT or LEFT hemithorax. Small bilateral pleural effusions remain. IMPRESSION: 1. No appreciable pneumothorax following thoracentesis 2. Reduction in volume of RIGHT pleural effusion. 3. Persistent bilateral small effusions Electronically Signed   By: Genevive Bi M.D.   On: 03/27/2017 17:21   Dg Chest Port 1 View  Result Date: 03/26/2017 CLINICAL DATA:  83 year old female with a history of trouble breathing EXAM: PORTABLE CHEST 1 VIEW COMPARISON:  03/17/2017, 03/14/2017 FINDINGS: Worsening right sided opacity obscuring the right heart border and the right hemidiaphragm. Thickening of the fissure with pleuroparenchymal thickening along the periphery of the right lung. New airspace opacity in the right suprahilar region. Retrocardiac opacity with obscuration of the left hemidiaphragm. Interlobular septal thickening Similar appearance of cardiomegaly. IMPRESSION: Worsening asymmetric pleural effusions, with enlarging right-sided pleural effusion and associated atelectasis/consolidation. Likely persisting small left-sided pleural effusion/atelectasis. Evidence  of interstitial edema. Persisting cardiomegaly. Electronically Signed   By: Gilmer Mor D.O.   On: 03/26/2017 11:47   Dg Chest  Port 1 View  Result Date: 03/17/2017 CLINICAL DATA:  Shortness of breath. EXAM: PORTABLE CHEST 1 VIEW COMPARISON:  Radiograph of March 14, 2017. FINDINGS: Stable cardiomediastinal silhouette. No pneumothorax is noted. Stable left basilar atelectasis or edema is noted with associated pleural effusion. Increased right basilar edema, atelectasis or infiltrate is noted with associated pleural effusion. Bony thorax is unremarkable. IMPRESSION: Stable left basilar opacity as described above. Increased right basilar opacity is noted concerning for worsening edema, atelectasis or infiltrate with associated pleural effusion. Electronically Signed   By: Lupita Raider, M.D.   On: 03/17/2017 09:33   Dg Swallowing Func-speech Pathology  Result Date: 03/15/2017 Objective Swallowing Evaluation: Type of Study: MBS-Modified Barium Swallow Study Patient Details Name: Barara Coggeshall MRN: [id number] Date of Birth: 1921-02-10 Today's Date: 03/15/2017 Time: SLP Start Time (ACUTE ONLY): 0900-SLP Stop Time (ACUTE ONLY): 0919 SLP Time Calculation (min) (ACUTE ONLY): 19 min Past Medical History: Past Medical History: Diagnosis Date . Atrial fibrillation (HCC)  . Coronary artery disease  . Hypertension  Past Surgical History: Past Surgical History: Procedure Laterality Date . COLOSTOMY   HPI: 83 yo female adm [facility] with weakness, confusion, somnolence - pt diagnosed with Urinary Tract Infection.  PMH + for old lacunar CVAs - bilateral basal ganglia, thalamic CVA.  Per daughter Colvin Caroli  - pt has been coughing with intake at home and reports issues with "feeling full" pointing to proximal esophagus.  Pt denies this being a significant issue.  She has resided with daughter for the last 18 months.  Pt with ? ascities per imaging study.  CXR showed large right pleural effusion - diagnosed with respiratory acidosis - for thoracentesis today.   Subjective: pt awake in bed, desires liquid intake Assessment / Plan / Recommendation CHL IP CLINICAL  IMPRESSIONS 03/15/2017 Clinical Impression Pt with mild oropharyngeal dysphagia with delayed oral transiting/coordination due to lingual discoordination.  Pharyngeal swallow is timely without aspiration/penetration nor significant residuals.  Minimal pharyngeal residuals presents which pt does not consistently sense but CUED dry swallows helpful to decrease.  Pt does take large boluses - most notably liquids - suspect more of self feeding deficit.  Recommend dys1/thin with strict precautions. Using live video, educated pt and daughter to findings/recommendations.  Spoke to MD and obtained diet order and posted signs in pt's room.  Of note, pt does become fatigued easily and may benefit from nutritional supplements.  SLP Visit Diagnosis Dysphagia, oropharyngeal phase (R13.12);Dysphagia, pharyngoesophageal phase (R13.14) Attention and concentration deficit following -- Frontal lobe and executive function deficit following -- Impact on safety and function Mild aspiration risk;Risk for inadequate nutrition/hydration   CHL IP TREATMENT RECOMMENDATION 03/15/2017 Treatment Recommendations F/U MBS in --- days (Comment)   Prognosis 03/15/2017 Prognosis for Safe Diet Advancement Fair Barriers to Reach Goals -- Barriers/Prognosis Comment -- CHL IP DIET RECOMMENDATION 03/15/2017 SLP Diet Recommendations Dysphagia 1 (Puree) solids;Thin liquid Liquid Administration via Cup;Straw Medication Administration Whole meds with puree Compensations Slow rate;Small sips/bites Postural Changes --   CHL IP OTHER RECOMMENDATIONS 03/15/2017 Recommended Consults -- Oral Care Recommendations -- Other Recommendations Order thickener from pharmacy   CHL IP FOLLOW UP RECOMMENDATIONS 03/15/2017 Follow up Recommendations (No Data)   CHL IP FREQUENCY AND DURATION 03/15/2017 Speech Therapy Frequency (ACUTE ONLY) min 2x/week Treatment Duration 1 week      CHL IP ORAL PHASE 03/15/2017 Oral Phase Impaired  Oral - Pudding Teaspoon -- Oral - Pudding Cup -- Oral - Honey  Teaspoon -- Oral - Honey Cup -- Oral - Nectar Teaspoon -- Oral - Nectar Cup Premature spillage;Weak lingual manipulation Oral - Nectar Straw -- Oral - Thin Teaspoon Weak lingual manipulation;Premature spillage Oral - Thin Cup Weak lingual manipulation;Premature spillage Oral - Thin Straw Premature spillage;Weak lingual manipulation Oral - Puree Premature spillage;Weak lingual manipulation;Decreased bolus cohesion Oral - Mech Soft Weak lingual manipulation;Premature spillage;Decreased bolus cohesion Oral - Regular -- Oral - Multi-Consistency -- Oral - Pill Weak lingual manipulation;Premature spillage Oral Phase - Comment --  CHL IP PHARYNGEAL PHASE 03/15/2017 Pharyngeal Phase Impaired Pharyngeal- Pudding Teaspoon -- Pharyngeal -- Pharyngeal- Pudding Cup -- Pharyngeal -- Pharyngeal- Honey Teaspoon -- Pharyngeal -- Pharyngeal- Honey Cup -- Pharyngeal -- Pharyngeal- Nectar Teaspoon WFL Pharyngeal -- Pharyngeal- Nectar Cup Pharyngeal residue - valleculae Pharyngeal -- Pharyngeal- Nectar Straw -- Pharyngeal -- Pharyngeal- Thin Teaspoon WFL Pharyngeal -- Pharyngeal- Thin Cup WFL;Pharyngeal residue - valleculae Pharyngeal -- Pharyngeal- Thin Straw WFL;Pharyngeal residue - valleculae Pharyngeal -- Pharyngeal- Puree Delayed swallow initiation-vallecula Pharyngeal -- Pharyngeal- Mechanical Soft Delayed swallow initiation-vallecula Pharyngeal -- Pharyngeal- Regular -- Pharyngeal -- Pharyngeal- Multi-consistency -- Pharyngeal -- Pharyngeal- Pill WFL Pharyngeal -- Pharyngeal Comment --  CHL IP CERVICAL ESOPHAGEAL PHASE 03/15/2017 Cervical Esophageal Phase Impaired Pudding Teaspoon -- Pudding Cup -- Honey Teaspoon -- Honey Cup -- Nectar Teaspoon -- Nectar Cup -- Nectar Straw -- Thin Teaspoon -- Thin Cup -- Thin Straw -- Puree -- Mechanical Soft -- Regular -- Multi-consistency -- Pill -- Cervical Esophageal Comment appearance of adequate clearance of barium = radiologist not present to confirm No flowsheet data found. Chales Abrahams 03/15/2017, 11:33 AM  Donavan Burnet, MS [facility] SLP [phone]             US Thoracentesis Asp Pleural Space W/img Guide  Result Date: 03/27/2017 INDICATION: Patient with right pleural effusion. Request is made for diagnostic and therapeutic thoracentesis. EXAM: ULTRASOUND GUIDED DIAGNOSTIC AND THERAPEUTIC RIGHT THORACENTESIS MEDICATIONS: 10 mL 1% lidocaine COMPLICATIONS: None immediate. PROCEDURE: An ultrasound guided thoracentesis was thoroughly discussed with the patient and questions answered. The benefits, risks, alternatives and complications were also discussed. The patient understands and wishes to proceed with the procedure. Written consent was obtained. Ultrasound was performed to localize and mark an adequate pocket of fluid in the right chest. The area was then prepped and draped in the normal sterile fashion. 1% Lidocaine was used for local anesthesia. Under ultrasound guidance a Safe-T-Centesis catheter was introduced. Thoracentesis was performed. The catheter was removed and a dressing applied. FINDINGS: A total of approximately 520 mL of blood-tinged fluid was removed. Samples were sent to the laboratory as requested by the clinical team. IMPRESSION: Successful ultrasound guided diagnostic and therapeutic thoracentesis yielding 520 mL of pleural fluid. Read by:  Loyce Dys PA-C Electronically Signed   By: Richarda Overlie M.D.   On: 03/27/2017 17:12   US Thoracentesis Asp Pleural Space W/img Guide  Result Date: 03/14/2017 INDICATION: Decompensated heart failure with a right-sided pleural effusion. Request is made for diagnostic and therapeutic thoracentesis. EXAM: ULTRASOUND GUIDED DIAGNOSTIC AND THERAPEUTIC THORACENTESIS MEDICATIONS: 1% lidocaine COMPLICATIONS: None immediate. PROCEDURE: An ultrasound guided thoracentesis was thoroughly discussed with the patient's son and questions answered. The benefits, risks, alternatives and complications were also discussed. The patient's son understands  and wishes to proceed with the procedure. Telephone consent was obtained. Ultrasound was performed to localize and mark an adequate pocket of fluid in the right chest. The  area was then prepped and draped in the normal sterile fashion. 1% Lidocaine was used for local anesthesia. Under ultrasound guidance a Safe-T-Centesis catheter was introduced. Thoracentesis was performed. The catheter was removed and a dressing applied. FINDINGS: A total of approximately 0.6 L of serous fluid was removed. Samples were sent to the laboratory as requested by the clinical team. IMPRESSION: Successful ultrasound guided right thoracentesis yielding 0.6 L of pleural fluid. Read by: Barnetta Chapel, PA-C Electronically Signed   By: Corlis Leak M.D.   On: 03/14/2017 16:20     Subjective: - no chest pain, shortness of breath, no abdominal pain, nausea or vomiting.   Discharge Exam: Vitals:   03/31/17 0553 03/31/17 0909  BP: 113/79 116/73  Pulse: 89 83  Resp: 18   Temp: 98 F (36.7 C)   SpO2: 100%    Vitals:   03/30/17 1738 03/30/17 2212 03/31/17 0553 03/31/17 0909  BP: (!) 95/57 106/61 113/79 116/73  Pulse: 67 85 89 83  Resp:  18 18   Temp:  98.5 F (36.9 C) 98 F (36.7 C)   TempSrc:  Oral Oral   SpO2:  100% 100%   Weight:      Height:        General: Pt is alert, awake, not in acute distress Cardiovascular: irregular, no rubs, no gallops Respiratory: CTA bilaterally, no wheezing, no rhonchi Abdominal: Soft, NT, ND, bowel sounds + Extremities: no edema, no cyanosis   The results of significant diagnostics from this hospitalization (including imaging, microbiology, ancillary and laboratory) are listed below for reference.     Microbiology: Recent Results (from the past 240 hour(s))  MRSA PCR Screening     Status: None   Collection Time: 03/26/17  3:41 PM  Result Value Ref Range Status   MRSA by PCR NEGATIVE NEGATIVE Final    Comment:        The GeneXpert MRSA Assay (FDA approved for NASAL  specimens only), is one component of a comprehensive MRSA colonization surveillance program. It is not intended to diagnose MRSA infection nor to guide or monitor treatment for MRSA infections.   Culture, body fluid-bottle     Status: None (Preliminary result)   Collection Time: 03/27/17  5:07 PM  Result Value Ref Range Status   Specimen Description FLUID PLEURAL RIGHT  Final   Special Requests BOTTLES DRAWN AEROBIC AND ANAEROBIC  Final   Culture   Final    NO GROWTH 3 DAYS Performed at [facility], [address] [place], [place] [address]    Report Status PENDING  Incomplete  Gram stain     Status: None   Collection Time: 03/27/17  5:07 PM  Result Value Ref Range Status   Specimen Description FLUID PLEURAL RIGHT  Final   Special Requests NONE  Final   Gram Stain   Final    MODERATE WBC PRESENT,BOTH PMN AND MONONUCLEAR NO ORGANISMS SEEN Performed at [facility], [address] [place], [place] [address]    Report Status 03/27/2017 FINAL  Final     Labs: BNP (last 3 results)  Recent Labs  03/13/17 1835 03/26/17 1100  BNP 761.6* 828.6*   Basic Metabolic Panel:  Recent Labs Lab 03/26/17 1100 03/27/17 0308 03/28/17 1016 03/29/17 1243 03/30/17 0918  NA 146* 146* 148* 146* 144  K 4.9 4.5 3.4* 3.3* 3.6  CL 103 100* 91* 91* 92*  CO2 37* 38* 48* >50* 46*  GLUCOSE 117* 90 105* 96 110*  BUN 36* 38* 41* 36* 35*  CREATININE 1.10* 1.12* 1.24* 1.00 1.17*  CALCIUM 8.2* 8.3* 8.5* 8.0* 8.0*   Liver Function Tests:  Recent Labs Lab 03/26/17 1100 03/27/17 0308 03/28/17 1016  AST 27 26 24   ALT 19 18 15   ALKPHOS 62 58 63  BILITOT 0.9 1.1 0.8  PROT 7.0 6.8 6.8  ALBUMIN 3.2* 3.0* 3.0*   No results for input(s): LIPASE, AMYLASE in the last 168 hours. No results for input(s): AMMONIA in the last 168 hours. CBC:  Recent Labs Lab 03/26/17 1100 03/27/17 0308  WBC 7.4 7.2  NEUTROABS 6.2  --   HGB 9.5* 9.3*  HCT 33.2* 32.2*  MCV 98.5 97.6  PLT  278 245   Cardiac Enzymes:  Recent Labs Lab 03/26/17 1100  TROPONINI 0.04*   BNP: Invalid input(s): POCBNP CBG: No results for input(s): GLUCAP in the last 168 hours. D-Dimer No results for input(s): DDIMER in the last 72 hours. Hgb A1c No results for input(s): HGBA1C in the last 72 hours. Lipid Profile No results for input(s): CHOL, HDL, LDLCALC, TRIG, CHOLHDL, LDLDIRECT in the last 72 hours. Thyroid function studies No results for input(s): TSH, T4TOTAL, T3FREE, THYROIDAB in the last 72 hours.  Invalid input(s): FREET3 Anemia work up No results for input(s): VITAMINB12, FOLATE, FERRITIN, TIBC, IRON, RETICCTPCT in the last 72 hours. Urinalysis    Component Value Date/Time   COLORURINE YELLOW 03/26/2017 1027   APPEARANCEUR CLEAR 03/26/2017 1027   LABSPEC 1.009 03/26/2017 1027   PHURINE 5.0 03/26/2017 1027   GLUCOSEU NEGATIVE 03/26/2017 1027   HGBUR NEGATIVE 03/26/2017 1027   BILIRUBINUR NEGATIVE 03/26/2017 1027   KETONESUR NEGATIVE 03/26/2017 1027   PROTEINUR NEGATIVE 03/26/2017 1027   NITRITE NEGATIVE 03/26/2017 1027   LEUKOCYTESUR NEGATIVE 03/26/2017 1027   Sepsis Labs Invalid input(s): PROCALCITONIN,  WBC,  LACTICIDVEN Microbiology Recent Results (from the past 240 hour(s))  MRSA PCR Screening     Status: None   Collection Time: 03/26/17  3:41 PM  Result Value Ref Range Status   MRSA by PCR NEGATIVE NEGATIVE Final    Comment:        The GeneXpert MRSA Assay (FDA approved for NASAL specimens only), is one component of a comprehensive MRSA colonization surveillance program. It is not intended to diagnose MRSA infection nor to guide or monitor treatment for MRSA infections.   Culture, body fluid-bottle     Status: None (Preliminary result)   Collection Time: 03/27/17  5:07 PM  Result Value Ref Range Status   Specimen Description FLUID PLEURAL RIGHT  Final   Special Requests BOTTLES DRAWN AEROBIC AND ANAEROBIC  Final   Culture   Final    NO GROWTH 3  DAYS Performed at [facility], [address], [place], [place] [address]    Report Status PENDING  Incomplete  Gram stain     Status: None   Collection Time: 03/27/17  5:07 PM  Result Value Ref Range Status   Specimen Description FLUID PLEURAL RIGHT  Final   Special Requests NONE  Final   Gram Stain   Final    MODERATE WBC PRESENT,BOTH PMN AND MONONUCLEAR NO ORGANISMS SEEN Performed at [facility], [address], [place], [place] [address]    Report Status 03/27/2017 FINAL  Final     Time coordinating discharge: 35 minutes  SIGNED:  Pamella Pert, MD  [facility] 03/31/2017, 9:16 AM Pager [phone]  If 7PM-7AM, please contact night-coverage [email or web address] Password [email or web address]

## 2017-03-31 NOTE — Progress Notes (Signed)
Called report to [facility]. Gave report to RN

## 2017-04-01 LAB — CULTURE, BODY FLUID-BOTTLE

## 2017-04-01 LAB — CULTURE, BODY FLUID W GRAM STAIN -BOTTLE: Culture: NO GROWTH

## 2017-04-13 ENCOUNTER — Encounter (HOSPITAL_COMMUNITY): Payer: Self-pay | Admitting: Emergency Medicine

## 2017-04-13 ENCOUNTER — Inpatient Hospital Stay (HOSPITAL_COMMUNITY)
Admission: EM | Admit: 2017-04-13 | Discharge: 2017-04-21 | DRG: 377 | Disposition: A | Discharge status: Hospice | Payer: Medicare Other | Attending: Internal Medicine | Admitting: Internal Medicine

## 2017-04-13 ENCOUNTER — Emergency Department (HOSPITAL_COMMUNITY): Payer: Medicare Other

## 2017-04-13 DIAGNOSIS — Z7401 Bed confinement status: Secondary | ICD-10-CM

## 2017-04-13 DIAGNOSIS — R41841 Cognitive communication deficit: Secondary | ICD-10-CM | POA: Diagnosis present

## 2017-04-13 DIAGNOSIS — B370 Candidal stomatitis: Secondary | ICD-10-CM | POA: Diagnosis present

## 2017-04-13 DIAGNOSIS — R64 Cachexia: Secondary | ICD-10-CM | POA: Diagnosis present

## 2017-04-13 DIAGNOSIS — D62 Acute posthemorrhagic anemia: Secondary | ICD-10-CM | POA: Diagnosis not present

## 2017-04-13 DIAGNOSIS — Z888 Allergy status to other drugs, medicaments and biological substances status: Secondary | ICD-10-CM

## 2017-04-13 DIAGNOSIS — R1310 Dysphagia, unspecified: Secondary | ICD-10-CM | POA: Diagnosis present

## 2017-04-13 DIAGNOSIS — R29810 Facial weakness: Secondary | ICD-10-CM | POA: Diagnosis present

## 2017-04-13 DIAGNOSIS — R54 Age-related physical debility: Secondary | ICD-10-CM | POA: Diagnosis present

## 2017-04-13 DIAGNOSIS — I10 Essential (primary) hypertension: Secondary | ICD-10-CM | POA: Diagnosis present

## 2017-04-13 DIAGNOSIS — Z933 Colostomy status: Secondary | ICD-10-CM

## 2017-04-13 DIAGNOSIS — E559 Vitamin D deficiency, unspecified: Secondary | ICD-10-CM | POA: Diagnosis present

## 2017-04-13 DIAGNOSIS — K922 Gastrointestinal hemorrhage, unspecified: Principal | ICD-10-CM

## 2017-04-13 DIAGNOSIS — G9340 Encephalopathy, unspecified: Secondary | ICD-10-CM | POA: Diagnosis present

## 2017-04-13 DIAGNOSIS — Z8249 Family history of ischemic heart disease and other diseases of the circulatory system: Secondary | ICD-10-CM

## 2017-04-13 DIAGNOSIS — J9622 Acute and chronic respiratory failure with hypercapnia: Secondary | ICD-10-CM | POA: Diagnosis present

## 2017-04-13 DIAGNOSIS — R1013 Epigastric pain: Secondary | ICD-10-CM | POA: Diagnosis present

## 2017-04-13 DIAGNOSIS — Z66 Do not resuscitate: Secondary | ICD-10-CM | POA: Diagnosis present

## 2017-04-13 DIAGNOSIS — D500 Iron deficiency anemia secondary to blood loss (chronic): Secondary | ICD-10-CM

## 2017-04-13 DIAGNOSIS — E785 Hyperlipidemia, unspecified: Secondary | ICD-10-CM | POA: Diagnosis present

## 2017-04-13 DIAGNOSIS — Z79899 Other long term (current) drug therapy: Secondary | ICD-10-CM

## 2017-04-13 DIAGNOSIS — N183 Chronic kidney disease, stage 3 unspecified: Secondary | ICD-10-CM | POA: Diagnosis present

## 2017-04-13 DIAGNOSIS — J90 Pleural effusion, not elsewhere classified: Secondary | ICD-10-CM

## 2017-04-13 DIAGNOSIS — Z9049 Acquired absence of other specified parts of digestive tract: Secondary | ICD-10-CM

## 2017-04-13 DIAGNOSIS — N179 Acute kidney failure, unspecified: Secondary | ICD-10-CM | POA: Diagnosis present

## 2017-04-13 DIAGNOSIS — I482 Chronic atrial fibrillation, unspecified: Secondary | ICD-10-CM | POA: Diagnosis present

## 2017-04-13 DIAGNOSIS — R059 Cough, unspecified: Secondary | ICD-10-CM

## 2017-04-13 DIAGNOSIS — Z6825 Body mass index (BMI) 25.0-25.9, adult: Secondary | ICD-10-CM

## 2017-04-13 DIAGNOSIS — I5033 Acute on chronic diastolic (congestive) heart failure: Secondary | ICD-10-CM | POA: Diagnosis present

## 2017-04-13 DIAGNOSIS — N184 Chronic kidney disease, stage 4 (severe): Secondary | ICD-10-CM | POA: Diagnosis present

## 2017-04-13 DIAGNOSIS — E860 Dehydration: Secondary | ICD-10-CM | POA: Diagnosis present

## 2017-04-13 DIAGNOSIS — Z85038 Personal history of other malignant neoplasm of large intestine: Secondary | ICD-10-CM

## 2017-04-13 DIAGNOSIS — J449 Chronic obstructive pulmonary disease, unspecified: Secondary | ICD-10-CM | POA: Diagnosis present

## 2017-04-13 DIAGNOSIS — D649 Anemia, unspecified: Secondary | ICD-10-CM | POA: Diagnosis present

## 2017-04-13 DIAGNOSIS — R05 Cough: Secondary | ICD-10-CM

## 2017-04-13 DIAGNOSIS — J9601 Acute respiratory failure with hypoxia: Secondary | ICD-10-CM | POA: Diagnosis present

## 2017-04-13 DIAGNOSIS — E43 Unspecified severe protein-calorie malnutrition: Secondary | ICD-10-CM | POA: Diagnosis present

## 2017-04-13 DIAGNOSIS — I130 Hypertensive heart and chronic kidney disease with heart failure and stage 1 through stage 4 chronic kidney disease, or unspecified chronic kidney disease: Secondary | ICD-10-CM | POA: Diagnosis present

## 2017-04-13 DIAGNOSIS — I878 Other specified disorders of veins: Secondary | ICD-10-CM | POA: Diagnosis present

## 2017-04-13 DIAGNOSIS — R627 Adult failure to thrive: Secondary | ICD-10-CM | POA: Diagnosis present

## 2017-04-13 DIAGNOSIS — Z515 Encounter for palliative care: Secondary | ICD-10-CM | POA: Diagnosis present

## 2017-04-13 DIAGNOSIS — I272 Pulmonary hypertension, unspecified: Secondary | ICD-10-CM | POA: Diagnosis present

## 2017-04-13 DIAGNOSIS — E870 Hyperosmolality and hypernatremia: Secondary | ICD-10-CM | POA: Diagnosis present

## 2017-04-13 HISTORY — DX: Respiratory failure, unspecified with hypoxia: J96.91

## 2017-04-13 HISTORY — DX: Muscle weakness (generalized): M62.81

## 2017-04-13 HISTORY — DX: Acute respiratory failure, unspecified whether with hypoxia or hypercapnia: J96.00

## 2017-04-13 HISTORY — DX: Acute on chronic diastolic (congestive) heart failure: I50.33

## 2017-04-13 HISTORY — DX: Cognitive communication deficit: R41.841

## 2017-04-13 HISTORY — DX: Dysphagia, unspecified: R13.10

## 2017-04-13 HISTORY — DX: Chronic kidney disease, stage 3 (moderate): N18.3

## 2017-04-13 HISTORY — DX: Unsteadiness on feet: R26.81

## 2017-04-13 HISTORY — DX: Other lack of coordination: R27.8

## 2017-04-13 HISTORY — DX: Vitamin D deficiency, unspecified: E55.9

## 2017-04-13 HISTORY — DX: Respiratory failure, unspecified with hypercapnia: J96.92

## 2017-04-13 HISTORY — DX: Hyperlipidemia, unspecified: E78.5

## 2017-04-13 HISTORY — DX: Chronic kidney disease, stage 3 unspecified: N18.30

## 2017-04-13 HISTORY — DX: Deficiency of other specified B group vitamins: E53.8

## 2017-04-13 HISTORY — DX: Anemia, unspecified: D64.9

## 2017-04-13 LAB — CBC WITH DIFFERENTIAL/PLATELET
BASOS ABS: 0 10*3/uL (ref 0.0–0.1)
BASOS PCT: 0 %
Eosinophils Absolute: 0.5 10*3/uL (ref 0.0–0.7)
Eosinophils Relative: 5 %
HEMATOCRIT: 29.2 % — AB (ref 36.0–46.0)
HEMOGLOBIN: 8.5 g/dL — AB (ref 12.0–15.0)
LYMPHS PCT: 8 %
Lymphs Abs: 0.8 10*3/uL (ref 0.7–4.0)
MCH: 28.1 pg (ref 26.0–34.0)
MCHC: 29.1 g/dL — ABNORMAL LOW (ref 30.0–36.0)
MCV: 96.4 fL (ref 78.0–100.0)
MONO ABS: 0.5 10*3/uL (ref 0.1–1.0)
MONOS PCT: 5 %
NEUTROS ABS: 8.2 10*3/uL — AB (ref 1.7–7.7)
NEUTROS PCT: 82 %
Platelets: 237 10*3/uL (ref 150–400)
RBC: 3.03 MIL/uL — ABNORMAL LOW (ref 3.87–5.11)
RDW: 16.5 % — AB (ref 11.5–15.5)
WBC: 10 10*3/uL (ref 4.0–10.5)

## 2017-04-13 LAB — IRON AND TIBC
Iron: 34 ug/dL (ref 28–170)
Saturation Ratios: 11 % (ref 10.4–31.8)
TIBC: 321 ug/dL (ref 250–450)
UIBC: 287 ug/dL

## 2017-04-13 LAB — RETICULOCYTES
RBC.: 3.03 MIL/uL — AB (ref 3.87–5.11)
RETIC COUNT ABSOLUTE: 48.5 10*3/uL (ref 19.0–186.0)
RETIC CT PCT: 1.6 % (ref 0.4–3.1)

## 2017-04-13 LAB — COMPREHENSIVE METABOLIC PANEL
ALBUMIN: 3.1 g/dL — AB (ref 3.5–5.0)
ALK PHOS: 78 U/L (ref 38–126)
ALT: 23 U/L (ref 14–54)
AST: 31 U/L (ref 15–41)
Anion gap: 10 (ref 5–15)
BUN: 39 mg/dL — AB (ref 6–20)
CALCIUM: 8.5 mg/dL — AB (ref 8.9–10.3)
CO2: 37 mmol/L — AB (ref 22–32)
CREATININE: 1.17 mg/dL — AB (ref 0.44–1.00)
Chloride: 96 mmol/L — ABNORMAL LOW (ref 101–111)
GFR calc Af Amer: 45 mL/min — ABNORMAL LOW (ref >60)
GFR calc non Af Amer: 39 mL/min — ABNORMAL LOW (ref >60)
GLUCOSE: 131 mg/dL — AB (ref 65–99)
Potassium: 3.8 mmol/L (ref 3.5–5.1)
SODIUM: 143 mmol/L (ref 135–145)
Total Bilirubin: 0.5 mg/dL (ref 0.3–1.2)
Total Protein: 7.5 g/dL (ref 6.5–8.1)

## 2017-04-13 LAB — APTT: aPTT: 31 seconds (ref 24–36)

## 2017-04-13 LAB — PROTIME-INR
INR: 1.06
PROTHROMBIN TIME: 13.7 s (ref 11.4–15.2)

## 2017-04-13 LAB — VITAMIN B12: VITAMIN B 12: 2581 pg/mL — AB (ref 180–914)

## 2017-04-13 LAB — FERRITIN: FERRITIN: 154 ng/mL (ref 11–307)

## 2017-04-13 LAB — ABO/RH: ABO/RH(D): O POS

## 2017-04-13 LAB — FOLATE: Folate: 9.9 ng/mL (ref >5.9)

## 2017-04-13 MED ORDER — ONDANSETRON HCL 4 MG/2ML IJ SOLN: 4.0000 mg | Freq: Four times a day (QID) | INTRAMUSCULAR | Status: DC | PRN

## 2017-04-13 MED ORDER — METOPROLOL SUCCINATE ER 25 MG PO TB24
50.0000 mg | ORAL_TABLET | Freq: Two times a day (BID) | ORAL | Status: DC
  Administered 2017-04-14 – 2017-04-18 (×8): 50 mg via ORAL
  Filled 2017-04-13 (×8): qty 1

## 2017-04-13 MED ORDER — ACETAMINOPHEN 325 MG PO TABS: 650.0000 mg | ORAL_TABLET | Freq: Four times a day (QID) | ORAL | Status: DC | PRN

## 2017-04-13 MED ORDER — MAGNESIUM OXIDE 400 (241.3 MG) MG PO TABS
200.0000 mg | ORAL_TABLET | Freq: Every day | ORAL | Status: DC
  Administered 2017-04-14 – 2017-04-21 (×5): 200 mg via ORAL
  Filled 2017-04-13 (×5): qty 1

## 2017-04-13 MED ORDER — DILTIAZEM HCL ER COATED BEADS 120 MG PO CP24
120.0000 mg | ORAL_CAPSULE | Freq: Every day | ORAL | Status: DC
  Administered 2017-04-14 – 2017-04-18 (×4): 120 mg via ORAL
  Filled 2017-04-13 (×4): qty 1

## 2017-04-13 MED ORDER — ACETAMINOPHEN 650 MG RE SUPP: 650.0000 mg | Freq: Four times a day (QID) | RECTAL | Status: DC | PRN

## 2017-04-13 MED ORDER — ENSURE ENLIVE PO LIQD
15.0000 mL | Freq: Every day | ORAL | Status: DC
  Administered 2017-04-14 – 2017-04-21 (×5): 15 mL via ORAL

## 2017-04-13 MED ORDER — FERROUS SULFATE 325 (65 FE) MG PO TABS
325.0000 mg | ORAL_TABLET | Freq: Every day | ORAL | Status: DC
  Administered 2017-04-14 – 2017-04-16 (×3): 325 mg via ORAL
  Filled 2017-04-13 (×3): qty 1

## 2017-04-13 MED ORDER — ONDANSETRON HCL 4 MG PO TABS: 4.0000 mg | ORAL_TABLET | Freq: Four times a day (QID) | ORAL | Status: DC | PRN

## 2017-04-13 MED ORDER — ATORVASTATIN CALCIUM 10 MG PO TABS
10.0000 mg | ORAL_TABLET | Freq: Every day | ORAL | Status: DC
  Administered 2017-04-14 – 2017-04-16 (×4): 10 mg via ORAL
  Filled 2017-04-13 (×4): qty 1

## 2017-04-13 MED ORDER — FUROSEMIDE 40 MG PO TABS
40.0000 mg | ORAL_TABLET | Freq: Every day | ORAL | Status: DC
  Administered 2017-04-14: 40 mg via ORAL
  Filled 2017-04-13: qty 2

## 2017-04-13 NOTE — ED Notes (Signed)
Room is waiting to be zapped per RN

## 2017-04-13 NOTE — ED Provider Notes (Signed)
[facility]-EMERGENCY DEPT Provider Note   CSN: [id number] Arrival date & time: 04/13/17  1743   History   Chief Complaint Chief Complaint  Patient presents with  . Anemia    HPI Emily Elliott is a 83 y.o. female.  HPI Patient presents to the emergency room for evaluation of anemia. Patient was recently admitted to the hospital on August 19 and discharged on August 24.at that time the patient was admitted to the hospital for shortness of breath. She had been in the hospital previously for a pleural fluid and a urinary tract infection.patient was discharged after treatment in the hospital.  Patient has been residing in a skilled nursing facility. Family has noticed increasing fatigue over the last few days.  Her son is a Development worker, community in [place]. He requested laboratory tests. Her laboratory tests showed that her hemoglobin is low at 8.3.  Her son states that prior to her illnesses her hemoglobin had been around the 12 range.  No known blood in her stool. No other signs of bleeding.  Past Medical History:  Diagnosis Date  . Acute on chronic diastolic (congestive) heart failure (HCC)   . Anemia   . ARF (acute respiratory failure) (HCC)   . Atrial fibrillation (HCC)   . CKD (chronic kidney disease) stage 3, GFR 30-59 ml/min   . Cognitive communication deficit   . Deficiency of other specified B group vitamins   . Dysphagia   . Hyperlipemia   . Hypertension   . Muscle weakness (generalized)   . Other lack of coordination   . Respiratory failure with hypoxia and hypercapnia (HCC)   . Unsteadiness on feet   . Vitamin D deficiency     Patient Active Problem List   Diagnosis Date Noted  . Acute on chronic diastolic congestive heart failure (HCC)   . Goals of care, counseling/discussion   . Palliative care encounter   . Respiratory failure (HCC) 03/26/2017  . Acute CHF (congestive heart failure) (HCC) 03/14/2017  . Coagulopathy (HCC) 03/14/2017  . Atrial fibrillation, chronic (HCC)  03/13/2017  . Essential hypertension 03/13/2017  . Hypernatremia 03/13/2017  . AKI (acute kidney injury) (HCC) 03/13/2017  . Acute encephalopathy 03/13/2017  . UTI (urinary tract infection) 03/13/2017  . History of colon cancer 03/13/2017  . Pleural effusion, right 03/13/2017  . Dysphagia 03/13/2017  . CKD (chronic kidney disease), stage III 03/13/2017  . Acute respiratory failure with hypoxia and hypercapnia (HCC)   . Foot pain, bilateral 05/10/2016    Past Surgical History:  Procedure Laterality Date  . COLOSTOMY      OB History    No data available       Home Medications    Prior to Admission medications   Medication Sig Start Date End Date Taking? Authorizing Provider  Ascorbic Acid (VITAMIN C) 1000 MG tablet Take 1,000 mg by mouth daily.   Yes [provider]  atorvastatin (LIPITOR) 10 MG tablet Take 10 mg by mouth at bedtime.    Yes [provider]  Cholecalciferol (VITAMIN D3) 5000 units CAPS Take 5,000 Units by mouth daily.   Yes [provider]  COD LIVER OIL/LOW VITAMIN A CAPS Take 1 capsule by mouth daily.   Yes [provider]  diltiazem (DILACOR XR) 120 MG 24 hr capsule Take 120 mg by mouth daily.   Yes [provider]  ferrous sulfate 324 (65 Fe) MG TBEC Take 1 tablet by mouth daily.   Yes [provider]  furosemide (LASIX) 40 MG  tablet Take 1 tablet (40 mg total) by mouth daily. 03/31/17  Yes Leatha GildingGherghe, Costin M, MD  Magnesium 250 MG TABS Take 250 mg by mouth daily.   Yes [provider]  metoprolol succinate (TOPROL-XL) 50 MG 24 hr tablet Take 50 mg by mouth 2 (two) times daily. Take with or immediately following a meal.   Yes [provider]  Nutritional Supplements (NUTRITIONAL SUPPLEMENT PLUS) LIQD Take 15 mLs by mouth daily.   Yes [provider]    Family History No family history on file.  Social History Social History  Substance Use Topics  . Smoking status: Never  Smoker  . Smokeless tobacco: Never Used  . Alcohol use No     Allergies   Benadryl [diphenhydramine]   Review of Systems Review of Systems  All other systems reviewed and are negative.    Physical Exam Updated Vital Signs BP (!) 149/97 (BP Location: Right Arm)   Pulse 99   Temp 98.2 F (36.8 C) (Oral)   Resp (!) 24   SpO2 100%   Physical Exam  Constitutional: No distress.  HENT:  Head: Normocephalic and atraumatic.  Right Ear: External ear normal.  Left Ear: External ear normal.  Eyes: Conjunctivae are normal. Right eye exhibits no discharge. Left eye exhibits no discharge. No scleral icterus.  Neck: Neck supple. No tracheal deviation present.  Cardiovascular: Normal rate, regular rhythm and intact distal pulses.   Pulmonary/Chest: Effort normal and breath sounds normal. No stridor. No respiratory distress. She has no wheezes. She has no rales.  Breathing comfortably with oxygen supplementation  Abdominal: Soft. Bowel sounds are normal. She exhibits no distension. There is no tenderness. There is no rebound and no guarding.  Musculoskeletal: She exhibits edema (mild edema noted bilaterally). She exhibits no tenderness.  Neurological: She is alert. She has normal strength. No cranial nerve deficit (no facial droop, extraocular movements intact, no slurred speech) or sensory deficit. She exhibits normal muscle tone. She displays no seizure activity. Coordination normal.  Skin: Skin is warm and dry. No rash noted. She is not diaphoretic.  Psychiatric: She has a normal mood and affect.  Nursing note and vitals reviewed.    ED Treatments / Results  Labs (all labs ordered are listed, but only abnormal results are displayed) Labs Reviewed  COMPREHENSIVE METABOLIC PANEL - Abnormal; Notable for the following:       Result Value   Chloride 96 (*)    CO2 37 (*)    Glucose, Bld 131 (*)    BUN 39 (*)    Creatinine, Ser 1.17 (*)    Calcium 8.5 (*)    Albumin 3.1 (*)    GFR  calc non Af Amer 39 (*)    GFR calc Af Amer 45 (*)    All other components within normal limits  CBC WITH DIFFERENTIAL/PLATELET - Abnormal; Notable for the following:    RBC 3.03 (*)    Hemoglobin 8.5 (*)    HCT 29.2 (*)    MCHC 29.1 (*)    RDW 16.5 (*)    Neutro Abs 8.2 (*)    All other components within normal limits  RETICULOCYTES - Abnormal; Notable for the following:    RBC. 3.03 (*)    All other components within normal limits  APTT  PROTIME-INR  VITAMIN B12  FOLATE  IRON AND TIBC  FERRITIN  POC OCCULT BLOOD, ED  TYPE AND SCREEN  ABO/RH    EKG  EKG Interpretation  Date/Time:  Thursday April 13 2017 18:09:43 EDT Ventricular Rate:  96 PR Interval:    QRS Duration: 87 QT Interval:  367 QTC Calculation: 464 R Axis:     Text Interpretation:  Atrial fibrillation Repol abnrm suggests ischemia, anterolateral Baseline wander in lead(s) V3 No significant change since last tracing Confirmed by Linwood Dibbles [phone]) on 04/13/2017 6:12:46 PM       Radiology Dg Chest Portable 1 View  Result Date: 04/13/2017 CLINICAL DATA:  Anemia.  Shortness of breath. EXAM: PORTABLE CHEST 1 VIEW COMPARISON:  03/27/2017 FINDINGS: Moderate to large right pleural effusion with passive atelectasis causing hazy density thickening of the minor fissure. Moderate left pleural effusion causing retrocardiac opacity. It is difficult to compare volumes to the prior exam because of differences in positioning, but the right pleural effusion could be larger than previous. Heart size within normal limits. IMPRESSION: 1. Large right and moderate left pleural effusions with associated passive atelectasis. The right pleural effusion may have enlarged in the interim, although this is not certain due to differences in the degree of reclining between images. Electronically Signed   By: Gaylyn Rong M.D.   On: 04/13/2017 19:10    Procedures Procedures (including critical care time)  Medications Ordered in  ED Medications - No data to display   Initial Impression / Assessment and Plan / ED Course  I have reviewed the triage vital signs and the nursing notes.  Pertinent labs & imaging results that were available during my care of the patient were reviewed by me and considered in my medical decision making (see chart for details).    Previous labs were reviewed.  On August 7 patient's hemoglobin was 11.4. Seems to be trending down since then. Hemoglobin was 10.0 on August 9 9.5 on August 19, and 9.3 on August 20.  Pt presented to the ED for worsening anemia.  She has been steadily trending downward.   She is guaic positive.  Likely occult GI bleeding causing her worsening anemia. I will consult with the medical service for admission.  Consider GI consult.    Final Clinical Impressions(s) / ED Diagnoses   Final diagnoses:  Gastrointestinal hemorrhage, unspecified gastrointestinal hemorrhage type  Blood loss anemia    New Prescriptions New Prescriptions   No medications on file     Linwood Dibbles, MD 04/13/17 2110

## 2017-04-13 NOTE — H&P (Signed)
History and Physical    Emily Elliott ONG:[id number] DOB: 1920-12-07 DOA: 04/13/2017  PCP: Jackie Plum, MD  Patient coming from: Nursing home.  Chief Complaint: Low hemoglobin.  HPI: Emily Elliott is a 83 y.o. female with history of diastolic CHF, atrial fibrillation, chronic kidney disease, chronic anemia and history of colon cancer status post surgery who was recently admitted 2 weeks ago for CHF and respiratory failure was discharged to nursing home and has been largely bedbound. Patient blood work showed worsening hemoglobin. Patient's hemoglobin has been gradually declining over the last 1 month from 11-8 at this time. No obvious bleeding seen. Patient has a colostomy bag. Patient's Coumadin was recently discontinued. Denies having taken any aspirin or NSAIDs. Patient otherwise is asymptomatic.   ED Course: In the stool for occult blood done through the colostomy bag with the ER physician was positive. Hemoglobin is found to be around 8. Patient is being admitted for further observation.  Review of Systems: As per HPI, rest all negative.   Past Medical History:  Diagnosis Date  . Acute on chronic diastolic (congestive) heart failure (HCC)   . Anemia   . ARF (acute respiratory failure) (HCC)   . Atrial fibrillation (HCC)   . CKD (chronic kidney disease) stage 3, GFR 30-59 ml/min   . Cognitive communication deficit   . Deficiency of other specified B group vitamins   . Dysphagia   . Hyperlipemia   . Hypertension   . Muscle weakness (generalized)   . Other lack of coordination   . Respiratory failure with hypoxia and hypercapnia (HCC)   . Unsteadiness on feet   . Vitamin D deficiency     Past Surgical History:  Procedure Laterality Date  . COLOSTOMY       reports that she has never smoked. She has never used smokeless tobacco. She reports that she does not drink alcohol or use drugs.  Allergies  Allergen Reactions  . Benadryl [Diphenhydramine] Rash    Family  History  Problem Relation Age of Onset  . Hypertension Other     Prior to Admission medications   Medication Sig Start Date End Date Taking? Authorizing Provider  Ascorbic Acid (VITAMIN C) 1000 MG tablet Take 1,000 mg by mouth daily.   Yes [provider]  atorvastatin (LIPITOR) 10 MG tablet Take 10 mg by mouth at bedtime.    Yes [provider]  Cholecalciferol (VITAMIN D3) 5000 units CAPS Take 5,000 Units by mouth daily.   Yes [provider]  COD LIVER OIL/LOW VITAMIN A CAPS Take 1 capsule by mouth daily.   Yes [provider]  diltiazem (DILACOR XR) 120 MG 24 hr capsule Take 120 mg by mouth daily.   Yes [provider]  ferrous sulfate 324 (65 Fe) MG TBEC Take 1 tablet by mouth daily.   Yes [provider]  furosemide (LASIX) 40 MG tablet Take 1 tablet (40 mg total) by mouth daily. 03/31/17  Yes Leatha Gilding, MD  Magnesium 250 MG TABS Take 250 mg by mouth daily.   Yes [provider]  metoprolol succinate (TOPROL-XL) 50 MG 24 hr tablet Take 50 mg by mouth 2 (two) times daily. Take with or immediately following a meal.   Yes [provider]  Nutritional Supplements (NUTRITIONAL SUPPLEMENT PLUS) LIQD Take 15 mLs by mouth daily.   Yes [provider]    Physical Exam: Vitals:   04/13/17 1802 04/13/17 2110 04/13/17 2259 04/13/17 2300  BP: (!) 149/97 Marland Kitchen)  146/94  134/77  Pulse: 99 86  88  Resp: (!) 24 (!) 25  (!) 22  Temp: 98.2 F (36.8 C) 98.4 F (36.9 C)    TempSrc: Oral Oral    SpO2: 100% 99%  100%  Weight:   65.3 kg (144 lb)   Height:   5\' 3"  (1.6 m)       Constitutional: Moderately built and nourished. Vitals:   04/13/17 1802 04/13/17 2110 04/13/17 2259 04/13/17 2300  BP: (!) 149/97 (!) 146/94  134/77  Pulse: 99 86  88  Resp: (!) 24 (!) 25  (!) 22  Temp: 98.2 F (36.8 C) 98.4 F (36.9 C)    TempSrc: Oral Oral    SpO2: 100% 99%  100%  Weight:   65.3 kg (144 lb)   Height:   5\' 3"  (1.6  m)    Eyes: Anicteric mild pallor. ENMT: No discharge from the ears eyes nose or mouth. Neck: No mass felt. No neck rigidity. Respiratory: No rhonchi or crepitations. Cardiovascular: S1 and S2 heard no murmurs appreciated. Abdomen: Soft nontender colostomy bag seen. No guarding or rigidity. Musculoskeletal: No edema. No joint effusion. Skin: No rash. Skin appears warm. Neurologic: Alert awake oriented to her name and place. Moves all extremities. Psychiatric: Oriented to name and place.   Labs on Admission: I have personally reviewed following labs and imaging studies  CBC:  Recent Labs Lab 04/13/17 1906  WBC 10.0  NEUTROABS 8.2*  HGB 8.5*  HCT 29.2*  MCV 96.4  PLT 237   Basic Metabolic Panel:  Recent Labs Lab 04/13/17 1906  NA 143  K 3.8  CL 96*  CO2 37*  GLUCOSE 131*  BUN 39*  CREATININE 1.17*  CALCIUM 8.5*   GFR: Estimated Creatinine Clearance: 27.3 mL/min (A) (by C-G formula based on SCr of 1.17 mg/dL (H)). Liver Function Tests:  Recent Labs Lab 04/13/17 1906  AST 31  ALT 23  ALKPHOS 78  BILITOT 0.5  PROT 7.5  ALBUMIN 3.1*   No results for input(s): LIPASE, AMYLASE in the last 168 hours. No results for input(s): AMMONIA in the last 168 hours. Coagulation Profile:  Recent Labs Lab 04/13/17 1906  INR 1.06   Cardiac Enzymes: No results for input(s): CKTOTAL, CKMB, CKMBINDEX, TROPONINI in the last 168 hours. BNP (last 3 results) No results for input(s): PROBNP in the last 8760 hours. HbA1C: No results for input(s): HGBA1C in the last 72 hours. CBG: No results for input(s): GLUCAP in the last 168 hours. Lipid Profile: No results for input(s): CHOL, HDL, LDLCALC, TRIG, CHOLHDL, LDLDIRECT in the last 72 hours. Thyroid Function Tests: No results for input(s): TSH, T4TOTAL, FREET4, T3FREE, THYROIDAB in the last 72 hours. Anemia Panel:  Recent Labs  04/13/17 1906  FOLATE 9.9  RETICCTPCT 1.6   Urine analysis:    Component Value Date/Time    COLORURINE YELLOW 03/26/2017 1027   APPEARANCEUR CLEAR 03/26/2017 1027   LABSPEC 1.009 03/26/2017 1027   PHURINE 5.0 03/26/2017 1027   GLUCOSEU NEGATIVE 03/26/2017 1027   HGBUR NEGATIVE 03/26/2017 1027   BILIRUBINUR NEGATIVE 03/26/2017 1027   KETONESUR NEGATIVE 03/26/2017 1027   PROTEINUR NEGATIVE 03/26/2017 1027   NITRITE NEGATIVE 03/26/2017 1027   LEUKOCYTESUR NEGATIVE 03/26/2017 1027   Sepsis Labs: @LABRCNTIP (procalcitonin:4,lacticidven:4) )No results found for this or any previous visit (from the past 240 hour(s)).   Radiological Exams on Admission: Dg Chest Portable 1 View  Result Date: 04/13/2017 CLINICAL DATA:  Anemia.  Shortness of breath. EXAM: PORTABLE  CHEST 1 VIEW COMPARISON:  03/27/2017 FINDINGS: Moderate to large right pleural effusion with passive atelectasis causing hazy density thickening of the minor fissure. Moderate left pleural effusion causing retrocardiac opacity. It is difficult to compare volumes to the prior exam because of differences in positioning, but the right pleural effusion could be larger than previous. Heart size within normal limits. IMPRESSION: 1. Large right and moderate left pleural effusions with associated passive atelectasis. The right pleural effusion may have enlarged in the interim, although this is not certain due to differences in the degree of reclining between images. Electronically Signed   By: Gaylyn Rong M.D.   On: 04/13/2017 19:10    EKG: Independently reviewed. A fibb rate controlled.  Assessment/Plan Principal Problem:   Acute GI bleeding Active Problems:   Atrial fibrillation, chronic (HCC)   Essential hypertension   CKD (chronic kidney disease), stage III   Acute blood loss anemia    1. Acute GI bleed - probably acute on chronic. Stool for occult blood done through the last bag was positive per the ER physician. Will keep patient on clear liquid diet and closely follow CBC. Consult gastroenterologist in a.m. Patient  is not on any blood thinners. 2. Acute blood loss anemia - follow CBC. If hemoglobin is less than 7 will transfuse. 3. History of colon cancer status post surgery with colostomy bag. Surgery was done in 2016 as per the patient's family. 4. Chronic A. Fib - on metoprolol and Cardizem. Not on anticoagulation as decided by cardiologist last admission 2 weeks ago. Chads 2 vasc score is more than 2. 5. Chronic kidney disease stage III - creatinine appears to be at baseline. 6. Diastolic CHF - chest x-ray shows pleural effusion. Continue Lasix. Closely monitor the respiratory status. Check daily weights and intake output.  I have reviewed patient's old charts last.  Patient's CODE STATUS during last admission was DO NOT RESUSCITATE. Patient's daughter changed it to full code this time.   DVT prophylaxis: SCDs. Code Status: Full code.  Family Communication: Patient's tolerance on.  Disposition Plan: To be determined.  Consults called: Palliative care.  Admission status: Observation.    Eduard Clos MD [facility] Pager [phone].  If 7PM-7AM, please contact night-coverage [email or web address] Password [email or web address]  04/13/2017, 11:24 PM

## 2017-04-13 NOTE — ED Triage Notes (Signed)
Pt presents via [facility] from [facility] ([address] after having lab work done yesterday that showed a hgb of 8.3. Family requested that pt come in to be evaluated for anemia. Alert and oriented.

## 2017-04-14 ENCOUNTER — Encounter (HOSPITAL_COMMUNITY): Payer: Self-pay

## 2017-04-14 DIAGNOSIS — R41841 Cognitive communication deficit: Secondary | ICD-10-CM | POA: Diagnosis present

## 2017-04-14 DIAGNOSIS — D649 Anemia, unspecified: Secondary | ICD-10-CM | POA: Diagnosis present

## 2017-04-14 DIAGNOSIS — D500 Iron deficiency anemia secondary to blood loss (chronic): Secondary | ICD-10-CM | POA: Diagnosis present

## 2017-04-14 DIAGNOSIS — I482 Chronic atrial fibrillation: Secondary | ICD-10-CM

## 2017-04-14 DIAGNOSIS — I130 Hypertensive heart and chronic kidney disease with heart failure and stage 1 through stage 4 chronic kidney disease, or unspecified chronic kidney disease: Secondary | ICD-10-CM | POA: Diagnosis present

## 2017-04-14 DIAGNOSIS — J9622 Acute and chronic respiratory failure with hypercapnia: Secondary | ICD-10-CM | POA: Diagnosis present

## 2017-04-14 DIAGNOSIS — B370 Candidal stomatitis: Secondary | ICD-10-CM | POA: Diagnosis present

## 2017-04-14 DIAGNOSIS — N184 Chronic kidney disease, stage 4 (severe): Secondary | ICD-10-CM | POA: Diagnosis present

## 2017-04-14 DIAGNOSIS — I10 Essential (primary) hypertension: Secondary | ICD-10-CM | POA: Diagnosis not present

## 2017-04-14 DIAGNOSIS — E785 Hyperlipidemia, unspecified: Secondary | ICD-10-CM | POA: Diagnosis present

## 2017-04-14 DIAGNOSIS — J9601 Acute respiratory failure with hypoxia: Secondary | ICD-10-CM | POA: Diagnosis present

## 2017-04-14 DIAGNOSIS — N183 Chronic kidney disease, stage 3 (moderate): Secondary | ICD-10-CM | POA: Diagnosis not present

## 2017-04-14 DIAGNOSIS — R1013 Epigastric pain: Secondary | ICD-10-CM | POA: Diagnosis present

## 2017-04-14 DIAGNOSIS — N179 Acute kidney failure, unspecified: Secondary | ICD-10-CM | POA: Diagnosis present

## 2017-04-14 DIAGNOSIS — J984 Other disorders of lung: Secondary | ICD-10-CM | POA: Diagnosis not present

## 2017-04-14 DIAGNOSIS — Z515 Encounter for palliative care: Secondary | ICD-10-CM | POA: Diagnosis present

## 2017-04-14 DIAGNOSIS — Z66 Do not resuscitate: Secondary | ICD-10-CM | POA: Diagnosis present

## 2017-04-14 DIAGNOSIS — R1310 Dysphagia, unspecified: Secondary | ICD-10-CM | POA: Diagnosis present

## 2017-04-14 DIAGNOSIS — Z7189 Other specified counseling: Secondary | ICD-10-CM | POA: Diagnosis not present

## 2017-04-14 DIAGNOSIS — R54 Age-related physical debility: Secondary | ICD-10-CM | POA: Diagnosis present

## 2017-04-14 DIAGNOSIS — I5033 Acute on chronic diastolic (congestive) heart failure: Secondary | ICD-10-CM | POA: Diagnosis present

## 2017-04-14 DIAGNOSIS — R627 Adult failure to thrive: Secondary | ICD-10-CM | POA: Diagnosis present

## 2017-04-14 DIAGNOSIS — E559 Vitamin D deficiency, unspecified: Secondary | ICD-10-CM | POA: Diagnosis present

## 2017-04-14 DIAGNOSIS — R64 Cachexia: Secondary | ICD-10-CM | POA: Diagnosis present

## 2017-04-14 DIAGNOSIS — R195 Other fecal abnormalities: Secondary | ICD-10-CM | POA: Diagnosis not present

## 2017-04-14 DIAGNOSIS — D62 Acute posthemorrhagic anemia: Secondary | ICD-10-CM | POA: Diagnosis present

## 2017-04-14 DIAGNOSIS — K922 Gastrointestinal hemorrhage, unspecified: Secondary | ICD-10-CM | POA: Diagnosis present

## 2017-04-14 DIAGNOSIS — J90 Pleural effusion, not elsewhere classified: Secondary | ICD-10-CM | POA: Diagnosis not present

## 2017-04-14 DIAGNOSIS — G9340 Encephalopathy, unspecified: Secondary | ICD-10-CM | POA: Diagnosis present

## 2017-04-14 DIAGNOSIS — E43 Unspecified severe protein-calorie malnutrition: Secondary | ICD-10-CM | POA: Diagnosis present

## 2017-04-14 DIAGNOSIS — E870 Hyperosmolality and hypernatremia: Secondary | ICD-10-CM | POA: Diagnosis present

## 2017-04-14 LAB — BASIC METABOLIC PANEL
ANION GAP: 9 (ref 5–15)
BUN: 36 mg/dL — AB (ref 6–20)
CO2: 39 mmol/L — ABNORMAL HIGH (ref 22–32)
Calcium: 8.4 mg/dL — ABNORMAL LOW (ref 8.9–10.3)
Chloride: 100 mmol/L — ABNORMAL LOW (ref 101–111)
Creatinine, Ser: 1.07 mg/dL — ABNORMAL HIGH (ref 0.44–1.00)
GFR calc Af Amer: 50 mL/min — ABNORMAL LOW (ref >60)
GFR, EST NON AFRICAN AMERICAN: 43 mL/min — AB (ref >60)
GLUCOSE: 108 mg/dL — AB (ref 65–99)
POTASSIUM: 3.7 mmol/L (ref 3.5–5.1)
Sodium: 148 mmol/L — ABNORMAL HIGH (ref 135–145)

## 2017-04-14 LAB — PREPARE RBC (CROSSMATCH)

## 2017-04-14 LAB — CBC
HCT: 26.3 % — ABNORMAL LOW (ref 36.0–46.0)
HCT: 28.2 % — ABNORMAL LOW (ref 36.0–46.0)
HEMATOCRIT: 27.4 % — AB (ref 36.0–46.0)
HEMOGLOBIN: 7.9 g/dL — AB (ref 12.0–15.0)
HEMOGLOBIN: 8.2 g/dL — AB (ref 12.0–15.0)
Hemoglobin: 7.6 g/dL — ABNORMAL LOW (ref 12.0–15.0)
MCH: 27.5 pg (ref 26.0–34.0)
MCH: 27.8 pg (ref 26.0–34.0)
MCH: 27.9 pg (ref 26.0–34.0)
MCHC: 28.9 g/dL — ABNORMAL LOW (ref 30.0–36.0)
MCHC: 28.8 g/dL — ABNORMAL LOW (ref 30.0–36.0)
MCHC: 29.1 g/dL — AB (ref 30.0–36.0)
MCV: 95.5 fL (ref 78.0–100.0)
MCV: 95.9 fL (ref 78.0–100.0)
MCV: 96.3 fL (ref 78.0–100.0)
PLATELETS: 231 10*3/uL (ref 150–400)
PLATELETS: 239 10*3/uL (ref 150–400)
PLATELETS: 244 10*3/uL (ref 150–400)
RBC: 2.73 MIL/uL — AB (ref 3.87–5.11)
RBC: 2.87 MIL/uL — AB (ref 3.87–5.11)
RBC: 2.94 MIL/uL — AB (ref 3.87–5.11)
RDW: 16.4 % — ABNORMAL HIGH (ref 11.5–15.5)
RDW: 16.6 % — ABNORMAL HIGH (ref 11.5–15.5)
RDW: 16.7 % — ABNORMAL HIGH (ref 11.5–15.5)
WBC: 7.6 10*3/uL (ref 4.0–10.5)
WBC: 8.2 10*3/uL (ref 4.0–10.5)
WBC: 9.5 10*3/uL (ref 4.0–10.5)

## 2017-04-14 LAB — MRSA PCR SCREENING: MRSA by PCR: NEGATIVE

## 2017-04-14 LAB — OCCULT BLOOD, POC DEVICE
FECAL OCCULT BLD: POSITIVE — AB
Fecal Occult Bld: POSITIVE — AB

## 2017-04-14 MED ORDER — RESOURCE THICKENUP CLEAR PO POWD
ORAL | Status: DC | PRN
  Filled 2017-04-14: qty 125

## 2017-04-14 MED ORDER — SODIUM CHLORIDE 0.9 % IV SOLN
Freq: Once | INTRAVENOUS | Status: AC
  Administered 2017-04-14: 11:00:00 via INTRAVENOUS

## 2017-04-14 MED ORDER — FUROSEMIDE 10 MG/ML IJ SOLN
20.0000 mg | Freq: Once | INTRAMUSCULAR | Status: AC
  Administered 2017-04-14: 20 mg via INTRAVENOUS
  Filled 2017-04-14: qty 2

## 2017-04-14 MED ORDER — PANTOPRAZOLE SODIUM 40 MG IV SOLR
40.0000 mg | INTRAVENOUS | Status: DC
  Administered 2017-04-14 – 2017-04-17 (×4): 40 mg via INTRAVENOUS
  Filled 2017-04-14 (×4): qty 40

## 2017-04-14 NOTE — ED Notes (Signed)
Changed old ostomy bag to new ostomy bag.

## 2017-04-14 NOTE — Care Management Note (Signed)
Case Management Note  Patient Details  Name: Emily Elliott MRN: [id number] Date of Birth: Mar 15, 1921  Subjective/Objective: 83 y/o f admitted w/Acute GIB. From home. PT cons-await recc.                   Action/Plan:d/c home.   Expected Discharge Date:                  Expected Discharge Plan:  Home w Home Health Services  In-House Referral:     Discharge planning Services  CM Consult  Post Acute Care Choice:    Choice offered to:     DME Arranged:    DME Agency:     HH Arranged:    HH Agency:     Status of Service:  In process, will continue to follow  If discussed at Long Length of Stay Meetings, dates discussed:    Additional Comments:  Emily Elliott, Emily Luckow, RN 04/14/2017, 2:02 PM

## 2017-04-14 NOTE — Consult Note (Signed)
Consultation  Referring Provider:  Dr. Jomarie Longs    Primary Care Physician:  Jackie Plum, MD Primary Gastroenterologist: Gentry Fitz        Reason for Consultation:     Anemia         HPI:   Emily Elliott is a 83 y.o. female with a past medical history of acute on chronic diastolic heart failure (last EF 65-70% on 03/14/17), CK-MB stage III, colon cancer status post partial colectomy with colostomy and A. fib (Coumadin stopped 2 weeks ago), as well as multiple others listed below, who presented to the hospital from a nursing facility with a low hemoglobin.   Per chart review patient was admitted 2 weeks ago for CHF and respiratory failure and was discharged to the nursing home and hs been largely bed bound. During that admission, palliative care was seen and suggested no further hospital admissions, patient was also made DO NOT RESUSCITATE. This has since been reversed. Patient's hemoglobin has been gradually declining over the past month from 11 to 8 at this time. No obvious bleeding seen. Patient does have a colostomy bag and it was found to be Hemoccult-positive from its contents. Coumadin was discontinued about 2 weeks ago.    Today, the patient is unable to answer questioning as she is lethargic and asleep and mostly mumbles when questioned. Her daughter is by her bedside as well as her son-in-law. They provide much of her history. Today, they explain that the patient is generally well other than feeling weak and tired. She is able to eat a soft diet/pured diet while in the nursing facility due to lack of teeth. She has been eating okay, but has just been growing weaker. They have not observed any large amounts of bright red blood from ostomy. Patient did have this placed 2 years ago per their history and has done well with this.  ED Course: In the stool for occult blood done through the colostomy bag with the ER physician was positive. Hemoglobin is found to be around 8. Patient is being  admitted for further observation  Past Medical History:  Diagnosis Date  . Acute on chronic diastolic (congestive) heart failure (HCC)   . Anemia   . ARF (acute respiratory failure) (HCC)   . Atrial fibrillation (HCC)   . CKD (chronic kidney disease) stage 3, GFR 30-59 ml/min   . Cognitive communication deficit   . Deficiency of other specified B group vitamins   . Dysphagia   . Hyperlipemia   . Hypertension   . Muscle weakness (generalized)   . Other lack of coordination   . Respiratory failure with hypoxia and hypercapnia (HCC)   . Unsteadiness on feet   . Vitamin D deficiency     Past Surgical History:  Procedure Laterality Date  . COLOSTOMY      Family History  Problem Relation Age of Onset  . Hypertension Other     Social History  Substance Use Topics  . Smoking status: Never Smoker  . Smokeless tobacco: Never Used  . Alcohol use No    Prior to Admission medications   Medication Sig Start Date End Date Taking? Authorizing Provider  Ascorbic Acid (VITAMIN C) 1000 MG tablet Take 1,000 mg by mouth daily.   Yes [provider]  atorvastatin (LIPITOR) 10 MG tablet Take 10 mg by mouth at bedtime.    Yes [provider]  Cholecalciferol (VITAMIN D3) 5000 units CAPS Take 5,000 Units by mouth daily.  Yes [provider]  COD LIVER OIL/LOW VITAMIN A CAPS Take 1 capsule by mouth daily.   Yes [provider]  diltiazem (DILACOR XR) 120 MG 24 hr capsule Take 120 mg by mouth daily.   Yes [provider]  ferrous sulfate 324 (65 Fe) MG TBEC Take 1 tablet by mouth daily.   Yes [provider]  furosemide (LASIX) 40 MG tablet Take 1 tablet (40 mg total) by mouth daily. 03/31/17  Yes Leatha Gilding, MD  Magnesium 250 MG TABS Take 250 mg by mouth daily.   Yes [provider]  metoprolol succinate (TOPROL-XL) 50 MG 24 hr tablet Take 50 mg by mouth 2 (two) times daily. Take with or immediately following a meal.   Yes  [provider]  Nutritional Supplements (NUTRITIONAL SUPPLEMENT PLUS) LIQD Take 15 mLs by mouth daily.   Yes [provider]    Current Facility-Administered Medications  Medication Dose Route Frequency Provider Last Rate Last Dose  . acetaminophen (TYLENOL) tablet 650 mg  650 mg Oral Q6H PRN Eduard Clos, MD       Or  . acetaminophen (TYLENOL) suppository 650 mg  650 mg Rectal Q6H PRN Eduard Clos, MD      . atorvastatin (LIPITOR) tablet 10 mg  10 mg Oral QHS Eduard Clos, MD   10 mg at 04/14/17 0100  . diltiazem (CARDIZEM CD) 24 hr capsule 120 mg  120 mg Oral Daily Eduard Clos, MD      . feeding supplement (ENSURE ENLIVE) (ENSURE ENLIVE) liquid 15 mL  15 mL Oral Daily Eduard Clos, MD      . ferrous sulfate tablet 325 mg  325 mg Oral Daily Eduard Clos, MD      . furosemide (LASIX) tablet 40 mg  40 mg Oral Daily Eduard Clos, MD      . magnesium oxide (MAG-OX) tablet 200 mg  200 mg Oral Daily Eduard Clos, MD      . metoprolol succinate (TOPROL-XL) 24 hr tablet 50 mg  50 mg Oral BID Eduard Clos, MD   50 mg at 04/14/17 0101  . ondansetron (ZOFRAN) tablet 4 mg  4 mg Oral Q6H PRN Eduard Clos, MD       Or  . ondansetron [facility]) injection 4 mg  4 mg Intravenous Q6H PRN Eduard Clos, MD      . RESOURCE THICKENUP CLEAR   Oral PRN Briscoe Deutscher, MD        Allergies as of 04/13/2017 - Review Complete 04/13/2017  Allergen Reaction Noted  . Benadryl [diphenhydramine] Rash 01/08/2016     Review of Systems:    Constitutional: No fever or chills Skin: No rash  Cardiovascular: No chest pain Respiratory: No SOB Gastrointestinal: See HPI and otherwise negative Genitourinary: No dysuria Neurological: No syncope Musculoskeletal: No new muscle or joint pain Hematologic: No bleeding or bruising Psychiatric: No history of depression or anxiety   Physical Exam:  Vital signs in last 24  hours: Temp:  [97.7 F (36.5 C)-98.9 F (37.2 C)] 97.7 F (36.5 C) (09/07 0425) Pulse Rate:  [82-99] 82 (09/07 0425) Resp:  [20-25] 20 (09/07 0425) BP: (127-156)/(77-97) 127/83 (09/07 0425) SpO2:  [99 %-100 %] 100 % (09/07 0425) Weight:  [144 lb (65.3 kg)-150 lb 9.2 oz (68.3 kg)] 150 lb 9.2 oz (68.3 kg) (09/07 0027) Last BM Date: 04/14/17 General:   Pleasant Elderly AA female appears to be in  NAD, Well developed, Well nourished, alert and cooperative Head:  Normocephalic and atraumatic. Eyes:   PEERL, EOMI. No icterus. Conjunctiva pink. Ears:  Normal auditory acuity. Neck:  Supple Throat: Oral cavity and pharynx without inflammation, swelling or lesion.  Lungs: Respirations even and unlabored. Lungs clear to auscultation bilaterally.   No wheezes, crackles, or rhonchi.  Heart: Normal S1, S2. No MRG. Regular rate and rhythm. No peripheral edema, cyanosis or pallor.  Abdomen:  Soft, nondistended, nontender. No rebound or guarding. Normal bowel sounds. No appreciable masses or hepatomegaly.Colostomy bag Rectal:  Not performed.  Msk:  Symmetrical without gross deformities.  Extremities:  Without edema, no deformity or joint abnormality.  Neurologic:   grossly normal neurologically.  Skin:   Dry and intact without significant lesions or rashes. Psychiatric:  Lethargic, mumbles   LAB RESULTS:  Recent Labs  04/13/17 1906 04/14/17 0046 04/14/17 0459  WBC 10.0 9.5 8.2  HGB 8.5* 8.2* 7.6*  HCT 29.2* 28.2* 26.3*  PLT 237 244 231   BMET  Recent Labs  04/13/17 1906 04/14/17 0459  NA 143 148*  K 3.8 3.7  CL 96* 100*  CO2 37* 39*  GLUCOSE 131* 108*  BUN 39* 36*  CREATININE 1.17* 1.07*  CALCIUM 8.5* 8.4*   LFT  Recent Labs  04/13/17 1906  PROT 7.5  ALBUMIN 3.1*  AST 31  ALT 23  ALKPHOS 78  BILITOT 0.5   PT/INR  Recent Labs  04/13/17 1906  LABPROT 13.7  INR 1.06    STUDIES: Dg Chest Portable 1 View  Result Date: 04/13/2017 CLINICAL DATA:  Anemia.   Shortness of breath. EXAM: PORTABLE CHEST 1 VIEW COMPARISON:  03/27/2017 FINDINGS: Moderate to large right pleural effusion with passive atelectasis causing hazy density thickening of the minor fissure. Moderate left pleural effusion causing retrocardiac opacity. It is difficult to compare volumes to the prior exam because of differences in positioning, but the right pleural effusion could be larger than previous. Heart size within normal limits. IMPRESSION: 1. Large right and moderate left pleural effusions with associated passive atelectasis. The right pleural effusion may have enlarged in the interim, although this is not certain due to differences in the degree of reclining between images. Electronically Signed   By: Gaylyn RongWalter  Liebkemann M.D.   On: 04/13/2017 19:10     PREVIOUS ENDOSCOPIES:            None recently-[place] in [place]   Impression / Plan:   Impression: 1. Anemia: occult positive from colostomy-suspected acute on chronic Gi bleed with slow drift in hgb; this could be from ostomy or other Gi source 2. Colon cancer: S/p colonic resection with colostomy in 2016 3. AFib 4. CKD stage III 5. Diastolic CHF  Plan: 1. Discussion with family regarding procedures. At this time, these are of greater risk to the patient with her chronic medical conditions and advanced age than they are of benefit. The family agrees. Apparently the patient's son [place] an internal [place] in [place] and plans to arrive later tonight or tomorrow morning and per the sister will likely want to have further discussion regarding this. At this time the family is okay with not having these procedures. 2. Advanced the patient to a soft diet 3. Patient will receive one unit PRBCs today and hemoglobin should be monitored with transfusion less than 7 4. Please await final recommendations from Dr. Lavon PaganiniNandigam later today.   Violet BaldyJennifer Lynne [facility]  04/14/2017, 10:29 AM Pager #: [phone]   Attending physician's  note   I have taken a history, examined the patient and reviewed the chart. I agree with the Advanced Practitioner's note, impression and recommendations. 6 yr F with multiple co-morbidities including CHF, colon ca s/p resection and colostomy, afib admitted with anemia.  Anemia is likely multifactorial, including slow GI blood loss with heme positive stool Patient is cachetic appearing, bed bound Continue supportive care and conservative management Will hold off endoscopic evaluation unless patient develops overt GI bleed requiring therapeutic endoscopic intervention Dr Elnoria Howard will see the patient tomorrow   K Scherry Ran, MD [phone] Mon-Fri 8a-5p [phone] after 5p, weekends, holidays

## 2017-04-14 NOTE — Progress Notes (Signed)
Received call from [facility] stating pt was admitted from their facility. Was there for ST rehab- [facility].  CSWs will follow.   Ilean SkillMeghan Jannifer Fischler, MSW, LCSW Clinical Social Work 04/14/2017 [phone]

## 2017-04-14 NOTE — Progress Notes (Signed)
Patient password is [place].  Pt daughter would only like information given to children ([name], Baldo [place] and [place])

## 2017-04-14 NOTE — Progress Notes (Signed)
PROGRESS NOTE    Emily Elliott  ZOX:[id number] DOB: April 20, 1921 DOA: 04/13/2017 PCP: Jackie Plumsei-Bonsu, George, MD  Brief Narrative:Emily Elliott is a 30F with chronic diastolic heart failure, CKD3, colon cancer status post partial colectomy with colostomy, P A. fib (Coumadin stopped 2 weeks ago) who presented to the hospital from a nursing facility with a low hemoglobin. She was just [facility] after hospitalization for CHF/Resp failure, was felt to have ongoing failure to thrive and during that admission, palliative care was consulted, she was made DNR and MOST Form suggested no further hospital admissions, now all this has been reversed and brought back to hospital for evaluation of anemia Coumadin was discontinued about 2 weeks ago. Gradual drop in Hb from 9.5/10 range last admit to 8 and 7.6 now, spoke to son who is an MD in [place] who insists on GI workup   Assessment & Plan:   Anemia/Heme positive ostomy output -anemia panel suggests mild iron deficiency -no active bleeding, suspect some intermittent oozing around stoma site, no longer on coumadin -I recommended PRBC and supportive care to family and then spoke to son in [place] who is an MD and reportedly one of the decision makers for pt who was adamant abt having GI workup -will order 1 unit PRBC -add PPI -GI consulted based on family demand  H/o COlon CA -s/p hemicolectomy in [place] 2[place]3years ago and colostomy  Chronic Afib -in NSR now -Coumadin stopped by [name] few weeks ago -continue diltiazem and metoprolol  CHronic diastolic CHF -lasix Iv after blood today and then resume PO 9/9  CKD 3 -stable  Dysphagia  -on Dysphagia 1 diet with nectar thick liquids at SNF, resume same  Debility, advanced age, ongoing failure to thrive, CHF, recurrent hospitalizations -declining functional and cognitive status -s/p DNR and MOST form filled last admit with plan for no Hospitalizations, now all this has been reversed by son who  is an MD and next of kin  DVT prophylaxis: SCDs Code Status: Full COde Family Communication: daughter at bedside, called and d/w Dr.Carl Dziedzic-son Disposition Plan: to be determined  Consultants:  GI  Subjective: -family insists on GI workup -remains very weak, poor Po intake  Objective: Vitals:   04/13/17 2300 04/14/17 0026 04/14/17 0027 04/14/17 0425  BP: 134/77 (!) 156/89  127/83  Pulse: 88 98  82  Resp: (!) 22 20  20   Temp:  98.9 F (37.2 C)  97.7 F (36.5 C)  TempSrc:  Oral  Oral  SpO2: 100% 100%  100%  Weight:   68.3 kg (150 lb 9.2 oz)   Height:        Intake/Output Summary (Last 24 hours) at 04/14/17 1302 Last data filed at 04/14/17 0900  Gross per 24 hour  Intake              320 ml  Output                0 ml  Net              320 ml   Filed Weights   04/13/17 2259 04/14/17 0027  Weight: 65.3 kg (144 lb) 68.3 kg (150 lb 9.2 oz)    Examination:  General exam: Frail elderly cachectic female, somnolent, easily arousable Respiratory system: decreased BS at bases Cardiovascular system: S1 & S2 heard, RRR. No JVD, murmurs Gastrointestinal system: Abdomen is nondistended, soft and nontender. Normal bowel sounds heard. Central nervous system:moves all extremities. No focal neurological deficits. Extremities:  Symmetric 5 x 5 power. Skin: No rashes, lesions or ulcers Psychiatry: flat affect    Data Reviewed:   CBC:  Recent Labs Lab 04/13/17 1906 04/14/17 0046 04/14/17 0459 04/14/17 0938  WBC 10.0 9.5 8.2 7.6  NEUTROABS 8.2*  --   --   --   HGB 8.5* 8.2* 7.6* 7.9*  HCT 29.2* 28.2* 26.3* 27.4*  MCV 96.4 95.9 96.3 95.5  PLT 237 244 231 239   Basic Metabolic Panel:  Recent Labs Lab 04/13/17 1906 04/14/17 0459  NA 143 148*  K 3.8 3.7  CL 96* 100*  CO2 37* 39*  GLUCOSE 131* 108*  BUN 39* 36*  CREATININE 1.17* 1.07*  CALCIUM 8.5* 8.4*   GFR: Estimated Creatinine Clearance: 30.5 mL/min (A) (by C-G formula based on SCr of 1.07 mg/dL  (H)). Liver Function Tests:  Recent Labs Lab 04/13/17 1906  AST 31  ALT 23  ALKPHOS 78  BILITOT 0.5  PROT 7.5  ALBUMIN 3.1*   No results for input(s): LIPASE, AMYLASE in the last 168 hours. No results for input(s): AMMONIA in the last 168 hours. Coagulation Profile:  Recent Labs Lab 04/13/17 1906  INR 1.06   Cardiac Enzymes: No results for input(s): CKTOTAL, CKMB, CKMBINDEX, TROPONINI in the last 168 hours. BNP (last 3 results) No results for input(s): PROBNP in the last 8760 hours. HbA1C: No results for input(s): HGBA1C in the last 72 hours. CBG: No results for input(s): GLUCAP in the last 168 hours. Lipid Profile: No results for input(s): CHOL, HDL, LDLCALC, TRIG, CHOLHDL, LDLDIRECT in the last 72 hours. Thyroid Function Tests: No results for input(s): TSH, T4TOTAL, FREET4, T3FREE, THYROIDAB in the last 72 hours. Anemia Panel:  Recent Labs  04/13/17 1906  VITAMINB12 2,581*  FOLATE 9.9  FERRITIN 154  TIBC 321  IRON 34  RETICCTPCT 1.6   Urine analysis:    Component Value Date/Time   COLORURINE YELLOW 03/26/2017 1027   APPEARANCEUR CLEAR 03/26/2017 1027   LABSPEC 1.009 03/26/2017 1027   PHURINE 5.0 03/26/2017 1027   GLUCOSEU NEGATIVE 03/26/2017 1027   HGBUR NEGATIVE 03/26/2017 1027   BILIRUBINUR NEGATIVE 03/26/2017 1027   KETONESUR NEGATIVE 03/26/2017 1027   PROTEINUR NEGATIVE 03/26/2017 1027   NITRITE NEGATIVE 03/26/2017 1027   LEUKOCYTESUR NEGATIVE 03/26/2017 1027   Sepsis Labs: (procalcitonin:4,lacticidven:4)  ) Recent Results (from the past 240 hour(s))  MRSA PCR Screening     Status: None   Collection Time: 04/14/17  1:30 AM  Result Value Ref Range Status   MRSA by PCR NEGATIVE NEGATIVE Final    Comment:        The GeneXpert MRSA Assay (FDA approved for NASAL specimens only), is one component of a comprehensive MRSA colonization surveillance program. It is not intended to diagnose MRSA infection nor to guide or monitor  treatment for MRSA infections.          Radiology Studies: Dg Chest Portable 1 View  Result Date: 04/13/2017 CLINICAL DATA:  Anemia.  Shortness of breath. EXAM: PORTABLE CHEST 1 VIEW COMPARISON:  03/27/2017 FINDINGS: Moderate to large right pleural effusion with passive atelectasis causing hazy density thickening of the minor fissure. Moderate left pleural effusion causing retrocardiac opacity. It is difficult to compare volumes to the prior exam because of differences in positioning, but the right pleural effusion could be larger than previous. Heart size within normal limits. IMPRESSION: 1. Large right and moderate left pleural effusions with associated passive atelectasis. The right pleural effusion may have enlarged in the interim,  although this is not certain due to differences in the degree of reclining between images. Electronically Signed   By: Gaylyn Rong M.D.   On: 04/13/2017 19:10        Scheduled Meds: . atorvastatin  10 mg Oral QHS  . diltiazem  120 mg Oral Daily  . feeding supplement (ENSURE ENLIVE)  15 mL Oral Daily  . ferrous sulfate  325 mg Oral Daily  . furosemide  40 mg Oral Daily  . magnesium oxide  200 mg Oral Daily  . metoprolol succinate  50 mg Oral BID   Continuous Infusions: . sodium chloride       LOS: 0 days    Time spent:    Zannie Cove, MD [facility] Pager [phone]  If 7PM-7AM, please contact night-coverage [email or web address] Password [email or web address] 04/14/2017, 1:02 PM

## 2017-04-14 NOTE — Care Management Obs Status (Signed)
MEDICARE OBSERVATION STATUS NOTIFICATION   Patient Details  Name: Emily NettleMavis Larivee MRN: [id number] Date of Birth: July 04, 1921   Medicare Observation Status Notification Given:  Yes    MahabirOlegario Messier, Dezhane Staten, RN 04/14/2017, 2:01 PM

## 2017-04-14 NOTE — ED Notes (Signed)
Patient taken upstairs by tech 

## 2017-04-15 LAB — TYPE AND SCREEN
ABO/RH(D): O POS
ANTIBODY SCREEN: NEGATIVE
Unit division: 0

## 2017-04-15 LAB — CBC
HCT: 32.9 % — ABNORMAL LOW (ref 36.0–46.0)
Hemoglobin: 9.6 g/dL — ABNORMAL LOW (ref 12.0–15.0)
MCH: 28 pg (ref 26.0–34.0)
MCHC: 29.2 g/dL — ABNORMAL LOW (ref 30.0–36.0)
MCV: 95.9 fL (ref 78.0–100.0)
PLATELETS: 218 10*3/uL (ref 150–400)
RBC: 3.43 MIL/uL — ABNORMAL LOW (ref 3.87–5.11)
RDW: 17.1 % — AB (ref 11.5–15.5)
WBC: 7.8 10*3/uL (ref 4.0–10.5)

## 2017-04-15 LAB — BASIC METABOLIC PANEL WITH GFR
Anion gap: 7 (ref 5–15)
BUN: 37 mg/dL — ABNORMAL HIGH (ref 6–20)
CO2: 39 mmol/L — ABNORMAL HIGH (ref 22–32)
Calcium: 8.4 mg/dL — ABNORMAL LOW (ref 8.9–10.3)
Chloride: 100 mmol/L — ABNORMAL LOW (ref 101–111)
Creatinine, Ser: 1.14 mg/dL — ABNORMAL HIGH (ref 0.44–1.00)
GFR calc Af Amer: 47 mL/min — ABNORMAL LOW
GFR calc non Af Amer: 40 mL/min — ABNORMAL LOW
Glucose, Bld: 118 mg/dL — ABNORMAL HIGH (ref 65–99)
Potassium: 3.9 mmol/L (ref 3.5–5.1)
Sodium: 146 mmol/L — ABNORMAL HIGH (ref 135–145)

## 2017-04-15 LAB — BPAM RBC
Blood Product Expiration Date: 201810042359
ISSUE DATE / TIME: 201809071319
UNIT TYPE AND RH: 5100

## 2017-04-15 MED ORDER — FUROSEMIDE 10 MG/ML IJ SOLN
20.0000 mg | Freq: Once | INTRAMUSCULAR | Status: AC
  Administered 2017-04-15: 20 mg via INTRAVENOUS
  Filled 2017-04-15: qty 2

## 2017-04-15 NOTE — Progress Notes (Addendum)
PROGRESS NOTE    Emily NettleMavis Lerch  ZOX:[id number] DOB: [birth year] DOA: 04/13/2017 PCP: Jackie Plumsei-Bonsu, George, MD  Brief Narrative:Emily Elliott is a 84F with chronic diastolic heart failure, CKD3, colon cancer status post partial colectomy with colostomy, P A. fib (Coumadin stopped 2 weeks ago) who presented to the hospital from a nursing facility with a low hemoglobin. She was just discharged from [facility] 8/24 after hospitalization for CHF/Resp failure, was felt to have ongoing failure to thrive and during that admission, palliative care was consulted, she was made DNR and MOST Form suggested no further hospital admissions, now all this has been reversed and brought back to hospital for evaluation of anemia Coumadin was discontinued about 2 weeks ago. Gradual drop in Hb from 9.5/10 range last admit to 8 and 7.6 now, spoke to son who is an [place] in [place] who insists on GI workup   Assessment & Plan:   Anemia/Heme positive ostomy output -anemia panel suggests mild iron deficiency -no active bleeding, suspect some intermittent oozing around stoma site, no longer on coumadin -s/p 1 unit PRBC, Hb improving -son in [place] who is an MD and reportedly one of the decision makers for pt who was adamant abt having GI workup -GI consulted based on family demand, agree with plan for conservative management, due to risk of complications  H/o COlon CA -s/p hemicolect[place] in [place] 2-3years ago and colostomy  Chronic Afib -in NSR now -Coumadin stopped by [name] few weeks ago -continue diltiazem and metoprolol  Acute on Chronic diastolic CHF -given lasix Iv after blood 9/8 -give a dose of IV lasix now  CKD 3 -stable  Dysphagia  -on Dysphagia 1 diet with nectar thick liquids at SNF, resume same  Debility, advanced age, ongoing failure to thrive, CHF, recurrent hospitalizations -declining functional and cognitive status -s/p DNR and MOST form filled last admit with plan for no Hospitalizations, now all this has  been reversed by son who is an MD and next of kin  DVT prophylaxis: SCDs Code Status: Partial code Family Communication: daughter at bedside, called and d/w Dr.Carl Rini-son, he agrees to Partial Code now Disposition Plan: back to SNF in 1-2days  Consultants:  GI  Subjective: -feels weak, no other complaints  Objective: Vitals:   04/14/17 1350 04/14/17 1631 04/14/17 2034 04/15/17 0531  BP: 131/85 120/75 132/80 (!) 147/80  Pulse: 91 81 83 89  Resp: 20 (!) 24 (!) 22 (!) 22  Temp: 97.7 F (36.5 C) 98.9 F (37.2 C) 98.5 F (36.9 C) 98.4 F (36.9 C)  TempSrc: Oral Oral Oral Oral  SpO2: 100% 100% 100% 96%  Weight:      Height:        Intake/Output Summary (Last 24 hours) at 04/15/17 1312 Last data filed at 04/15/17 1132  Gross per 24 hour  Intake              572 ml  Output              700 ml  Net             -128 ml   Filed Weights   04/13/17 2259 04/14/17 0027  Weight: 65.3 kg (144 lb) 68.3 kg (150 lb 9.2 oz)    Examination:   Gen: frail, elderly female, somnolent, no distress HEENT: PERRLA, Neck supple, no JVD Lungs: few basilar crackles CVS: RRR,No Gallops,Rubs or new Murmurs Abd: soft, Non tender, non distended, BS present Extremities: No Cyanosis, Clubbing or edema Skin: no new rashes  Psychiatry: flat affect    Data Reviewed:   CBC:  Recent Labs Lab 04/13/17 1906 04/14/17 0046 04/14/17 0459 04/14/17 0938 04/15/17 0601  WBC 10.0 9.5 8.2 7.6 7.8  NEUTROABS 8.2*  --   --   --   --   HGB 8.5* 8.2* 7.6* 7.9* 9.6*  HCT 29.2* 28.2* 26.3* 27.4* 32.9*  MCV 96.4 95.9 96.3 95.5 95.9  PLT 237 244 231 239 218   Basic Metabolic Panel:  Recent Labs Lab 04/13/17 1906 04/14/17 0459 04/15/17 0601  NA 143 148* 146*  K 3.8 3.7 3.9  CL 96* 100* 100*  CO2 37* 39* 39*  GLUCOSE 131* 108* 118*  BUN 39* 36* 37*  CREATININE 1.17* 1.07* 1.14*  CALCIUM 8.5* 8.4* 8.4*   GFR: Estimated Creatinine Clearance: 28.6 mL/min (A) (by C-G formula based on SCr  of 1.14 mg/dL (H)). Liver Function Tests:  Recent Labs Lab 04/13/17 1906  AST 31  ALT 23  ALKPHOS 78  BILITOT 0.5  PROT 7.5  ALBUMIN 3.1*   No results for input(s): LIPASE, AMYLASE in the last 168 hours. No results for input(s): AMMONIA in the last 168 hours. Coagulation Profile:  Recent Labs Lab 04/13/17 1906  INR 1.06   Cardiac Enzymes: No results for input(s): CKTOTAL, CKMB, CKMBINDEX, TROPONINI in the last 168 hours. BNP (last 3 results) No results for input(s): PROBNP in the last 8760 hours. HbA1C: No results for input(s): HGBA1C in the last 72 hours. CBG: No results for input(s): GLUCAP in the last 168 hours. Lipid Profile: No results for input(s): CHOL, HDL, LDLCALC, TRIG, CHOLHDL, LDLDIRECT in the last 72 hours. Thyroid Function Tests: No results for input(s): TSH, T4TOTAL, FREET4, T3FREE, THYROIDAB in the last 72 hours. Anemia Panel:  Recent Labs  04/13/17 1906  VITAMINB12 2,581*  FOLATE 9.9  FERRITIN 154  TIBC 321  IRON 34  RETICCTPCT 1.6   Urine analysis:    Component Value Date/Time   COLORURINE YELLOW 03/26/2017 1027   APPEARANCEUR CLEAR 03/26/2017 1027   LABSPEC 1.009 03/26/2017 1027   PHURINE 5.0 03/26/2017 1027   GLUCOSEU NEGATIVE 03/26/2017 1027   HGBUR NEGATIVE 03/26/2017 1027   BILIRUBINUR NEGATIVE 03/26/2017 1027   KETONESUR NEGATIVE 03/26/2017 1027   PROTEINUR NEGATIVE 03/26/2017 1027   NITRITE NEGATIVE 03/26/2017 1027   LEUKOCYTESUR NEGATIVE 03/26/2017 1027   Sepsis Labs: (procalcitonin:4,lacticidven:4)  ) Recent Results (from the past 240 hour(s))  MRSA PCR Screening     Status: None   Collection Time: 04/14/17  1:30 AM  Result Value Ref Range Status   MRSA by PCR NEGATIVE NEGATIVE Final    Comment:        The GeneXpert MRSA Assay (FDA approved for NASAL specimens only), is one component of a comprehensive MRSA colonization surveillance program. It is not intended to diagnose MRSA infection nor to guide  or monitor treatment for MRSA infections.          Radiology Studies: Dg Chest Portable 1 View  Result Date: 04/13/2017 CLINICAL DATA:  Anemia.  Shortness of breath. EXAM: PORTABLE CHEST 1 VIEW COMPARISON:  03/27/2017 FINDINGS: Moderate to large right pleural effusion with passive atelectasis causing hazy density thickening of the minor fissure. Moderate left pleural effusion causing retrocardiac opacity. It is difficult to compare volumes to the prior exam because of differences in positioning, but the right pleural effusion could be larger than previous. Heart size within normal limits. IMPRESSION: 1. Large right and moderate left pleural effusions with associated passive atelectasis. The  right pleural effusion may have enlarged in the interim, although this is not certain due to differences in the degree of reclining between images. Electronically Signed   By: Gaylyn Rong M.D.   On: 04/13/2017 19:10        Scheduled Meds: . atorvastatin  10 mg Oral QHS  . diltiazem  120 mg Oral Daily  . feeding supplement (ENSURE ENLIVE)  15 mL Oral Daily  . ferrous sulfate  325 mg Oral Daily  . magnesium oxide  200 mg Oral Daily  . metoprolol succinate  50 mg Oral BID  . pantoprazole (PROTONIX) IV  40 mg Intravenous Q24H   Continuous Infusions:    LOS: 1 day    Time spent:    Zannie Cove, MD [facility] Pager [phone]  If 7PM-7AM, please contact night-coverage [email or web address] Password [email or web address] 04/15/2017, 1:12 PM

## 2017-04-15 NOTE — Progress Notes (Signed)
Subjective: No acute events.  Objective: Vital signs in last 24 hours: Temp:  [97.5 F (36.4 C)-98.9 F (37.2 C)] 98.4 F (36.9 C) (09/08 0531) Pulse Rate:  [81-92] 89 (09/08 0531) Resp:  [20-24] 22 (09/08 0531) BP: (120-147)/(75-88) 147/80 (09/08 0531) SpO2:  [96 %-100 %] 96 % (09/08 0531) Last BM Date: 04/14/17  Intake/Output from previous day: 09/07 0701 - 09/08 0700 In: 1072 [P.O.:720; Blood:352] Out: 700 [Urine:700] Intake/Output this shift: Total I/O In: -  Out: 200 [Urine:200]  General appearance: arousable, responds to voice, soft spoken. GI: soft, non-tender; bowel sounds normal; no masses,  no organomegaly  Lab Results:  Recent Labs  04/14/17 0459 04/14/17 0938 04/15/17 0601  WBC 8.2 7.6 7.8  HGB 7.6* 7.9* 9.6*  HCT 26.3* 27.4* 32.9*  PLT 231 239 218   BMET  Recent Labs  04/13/17 1906 04/14/17 0459  NA 143 148*  K 3.8 3.7  CL 96* 100*  CO2 37* 39*  GLUCOSE 131* 108*  BUN 39* 36*  CREATININE 1.17* 1.07*  CALCIUM 8.5* 8.4*   LFT  Recent Labs  04/13/17 1906  PROT 7.5  ALBUMIN 3.1*  AST 31  ALT 23  ALKPHOS 78  BILITOT 0.5   PT/INR  Recent Labs  04/13/17 1906  LABPROT 13.7  INR 1.06   Hepatitis Panel No results for input(s): HEPBSAG, HCVAB, HEPAIGM, HEPBIGM in the last 72 hours. C-Diff No results for input(s): CDIFFTOX in the last 72 hours. Fecal Lactopherrin No results for input(s): FECLLACTOFRN in the last 72 hours.  Studies/Results: Dg Chest Portable 1 View  Result Date: 04/13/2017 CLINICAL DATA:  Anemia.  Shortness of breath. EXAM: PORTABLE CHEST 1 VIEW COMPARISON:  03/27/2017 FINDINGS: Moderate to large right pleural effusion with passive atelectasis causing hazy density thickening of the minor fissure. Moderate left pleural effusion causing retrocardiac opacity. It is difficult to compare volumes to the prior exam because of differences in positioning, but the right pleural effusion could be larger than previous. Heart size  within normal limits. IMPRESSION: 1. Large right and moderate left pleural effusions with associated passive atelectasis. The right pleural effusion may have enlarged in the interim, although this is not certain due to differences in the degree of reclining between images. Electronically Signed   By: Gaylyn RongWalter  Liebkemann M.D.   On: 04/13/2017 19:10    Medications:  Scheduled: . atorvastatin  10 mg Oral QHS  . diltiazem  120 mg Oral Daily  . feeding supplement (ENSURE ENLIVE)  15 mL Oral Daily  . ferrous sulfate  325 mg Oral Daily  . furosemide  40 mg Oral Daily  . magnesium oxide  200 mg Oral Daily  . metoprolol succinate  50 mg Oral BID  . pantoprazole (PROTONIX) IV  40 mg Intravenous Q24H   Continuous:   Assessment/Plan: 1) Anemia. 2) History of colon cancer. 3) Afib. 4) Stage 3 CKD. 5) Diastolic CHF.   The patient is very frail and endoscopic procedures at her age put her at high risk for complications.  Also, in this state, I doubt that she would be able to tolerate a colonic prep.  HGB has increased with blood transfusions.  Plan: 1) Follow HGB. 2) Transfuse as necessary. 3) No endoscopic procedures recommended.    LOS: 1 day   Rashunda Passon D 04/15/2017, 6:34 AM

## 2017-04-15 NOTE — Evaluation (Signed)
Physical Therapy Evaluation Patient Details Name: Emily Elliott MRN: [id number] DOB: 1921/02/23 Today's Date: 04/15/2017   History of Present Illness  83 y.o. female with history of diastolic CHF, atrial fibrillation, chronic kidney disease, chronic anemia and history of colon cancer status post hemicolectomy who was recently admitted 2 weeks ago for CHF and respiratory failure was discharged to nursing home and has been largely bedbound. Pt admitted with anemia, GIB.  Clinical Impression  Pt admitted with above diagnosis. Pt currently with functional limitations due to the deficits listed below (see PT Problem List).+2 total assist for bed mobility and sit to stand, pt unable to fully stand so lift recommended for transfers.  Pt will benefit from skilled PT to increase their independence and safety with mobility to allow discharge to the venue listed below.       Follow Up Recommendations SNF    Equipment Recommendations  None recommended by PT    Recommendations for Other Services       Precautions / Restrictions Precautions Precautions: Fall Precaution Comments: monitor O2 sats Restrictions Weight Bearing Restrictions: No      Mobility  Bed Mobility Overal bed mobility: Needs Assistance Bed Mobility: Supine to Sit;Sit to Supine     Supine to sit: +2 for physical assistance;Max assist Sit to supine: +2 for physical assistance;Total assist   General bed mobility comments: max assist to raise trunk, pt able to assist with advancing BLEs; +2 total sit to supine  Transfers Overall transfer level: Needs assistance Equipment used: Rolling walker (2 wheeled) Transfers: Sit to/from Stand Sit to Stand: +2 physical assistance;Total assist         General transfer comment: sit to stand x 2 with RW with +2 assist; pt unable to achieve full upright position, BLEs buckled, pt with flexed hips/knees; unsafe to attempt SPT so returned to bed; recommended lift to Nurse and  NT  Ambulation/Gait                Stairs            Wheelchair Mobility    Modified Rankin (Stroke Patients Only)       Balance Overall balance assessment: Needs assistance Sitting-balance support: Bilateral upper extremity supported;Feet supported Sitting balance-Leahy Scale: Poor Sitting balance - Comments: mod A initially for static sitting, leans posteriorly and to R Postural control: Right lateral lean;Posterior lean   Standing balance-Leahy Scale: Zero                               Pertinent Vitals/Pain Pain Assessment: No/denies pain    Home Living Family/patient expects to be discharged to:: Skilled nursing facility                      Prior Function Level of Independence: Needs assistance   Gait / Transfers Assistance Needed: pt reports at SNF she was transferring with assist or with lift; per prior PT note she was able to ambulate prior to early August  ADL's / Homemaking Assistance Needed: assist required  Comments: pt poor historian      Hand Dominance        Extremity/Trunk Assessment        Lower Extremity Assessment Lower Extremity Assessment: RLE deficits/detail;LLE deficits/detail RLE Deficits / Details: +2/5 grossly LLE Deficits / Details: +2/5 grossly       Communication   Communication: HOH  Cognition Arousal/Alertness: Awake/alert Behavior During Therapy: [facility] for  tasks assessed/performed Overall Cognitive Status: No family/caregiver present to determine baseline cognitive functioning                                 General Comments: poor historian, pleasant and can follow commands      General Comments General comments (skin integrity, edema, etc.): SaO2 85% on RA, 93% on 2L O2    Exercises     Assessment/Plan    PT Assessment Patient needs continued PT services  PT Problem List Decreased strength;Decreased activity tolerance;Decreased balance;Decreased mobility       PT  Treatment Interventions Functional mobility training;Therapeutic exercise;Patient/family education;Therapeutic activities    PT Goals (Current goals can be found in the Care Plan section)  Acute Rehab PT Goals Patient Stated Goal: to get up to recliner PT Goal Formulation: With patient Time For Goal Achievement: 04/29/17 Potential to Achieve Goals: Fair    Frequency Min 2X/week   Barriers to discharge        Co-evaluation               AM-PAC PT "6 Clicks" Daily Activity  Outcome Measure Difficulty turning over in bed (including adjusting bedclothes, sheets and blankets)?: Unable Difficulty moving from lying on back to sitting on the side of the bed? : Unable Difficulty sitting down on and standing up from a chair with arms (e.g., wheelchair, bedside commode, etc,.)?: Unable Help needed moving to and from a bed to chair (including a wheelchair)?: Total Help needed walking in hospital room?: Total Help needed climbing 3-5 steps with a railing? : Total 6 Click Score: 6    End of Session Equipment Utilized During Treatment: Gait belt;Oxygen Activity Tolerance: Patient limited by fatigue Patient left: in bed;with bed alarm set;with call bell/phone within reach Nurse Communication: Need for lift equipment;Mobility status PT Visit Diagnosis: Unsteadiness on feet (R26.81);Muscle weakness (generalized) (M62.81);Difficulty in walking, not elsewhere classified (R26.2)    Time: [id number] PT Time Calculation (min) (ACUTE ONLY): 26 min   Charges:   PT Evaluation $PT Eval Moderate Complexity: 1 Mod PT Treatments $Therapeutic Activity: 8-22 mins   PT G Codes:          Tamala SerUhlenberg, Peightyn Roberson Kistler 04/15/2017, 11:03 AM [phone]

## 2017-04-16 ENCOUNTER — Inpatient Hospital Stay (HOSPITAL_COMMUNITY): Payer: Medicare Other

## 2017-04-16 LAB — BASIC METABOLIC PANEL
Anion gap: 9 (ref 5–15)
BUN: 41 mg/dL — ABNORMAL HIGH (ref 6–20)
CO2: 40 mmol/L — ABNORMAL HIGH (ref 22–32)
Calcium: 8.5 mg/dL — ABNORMAL LOW (ref 8.9–10.3)
Chloride: 100 mmol/L — ABNORMAL LOW (ref 101–111)
Creatinine, Ser: 1.25 mg/dL — ABNORMAL HIGH (ref 0.44–1.00)
GFR calc Af Amer: 42 mL/min — ABNORMAL LOW (ref >60)
GFR calc non Af Amer: 36 mL/min — ABNORMAL LOW (ref >60)
Glucose, Bld: 136 mg/dL — ABNORMAL HIGH (ref 65–99)
Potassium: 4.2 mmol/L (ref 3.5–5.1)
Sodium: 149 mmol/L — ABNORMAL HIGH (ref 135–145)

## 2017-04-16 LAB — CBC
HCT: 35.8 % — ABNORMAL LOW (ref 36.0–46.0)
Hemoglobin: 10.3 g/dL — ABNORMAL LOW (ref 12.0–15.0)
MCH: 28.3 pg (ref 26.0–34.0)
MCHC: 28.8 g/dL — ABNORMAL LOW (ref 30.0–36.0)
MCV: 98.4 fL (ref 78.0–100.0)
Platelets: 225 10*3/uL (ref 150–400)
RBC: 3.64 MIL/uL — ABNORMAL LOW (ref 3.87–5.11)
RDW: 16.4 % — ABNORMAL HIGH (ref 11.5–15.5)
WBC: 9.8 10*3/uL (ref 4.0–10.5)

## 2017-04-16 LAB — GLUCOSE, CAPILLARY: Glucose-Capillary: 152 mg/dL — ABNORMAL HIGH (ref 65–99)

## 2017-04-16 MED ORDER — ALBUTEROL SULFATE (2.5 MG/3ML) 0.083% IN NEBU
2.5000 mg | INHALATION_SOLUTION | RESPIRATORY_TRACT | Status: DC | PRN
  Administered 2017-04-16: 2.5 mg via RESPIRATORY_TRACT
  Filled 2017-04-16: qty 3

## 2017-04-16 MED ORDER — FUROSEMIDE 10 MG/ML IJ SOLN
20.0000 mg | Freq: Two times a day (BID) | INTRAMUSCULAR | Status: DC
  Administered 2017-04-16 – 2017-04-17 (×2): 20 mg via INTRAVENOUS
  Filled 2017-04-16 (×2): qty 2

## 2017-04-16 NOTE — Progress Notes (Signed)
PROGRESS NOTE    Emily Elliott  VHQ:[id number] DOB: 05-Oct-1920 DOA: 04/13/2017 PCP: Jackie Plum, MD  Brief Narrative:Emily Elliott is a 43F with chronic diastolic heart failure, CKD3, colon cancer status post partial colectomy with colostomy, P A. fib (Coumadin stopped 2 weeks ago) who presented to the hospital from a nursing facility with a low hemoglobin. She was just discharged from [facility] 8/24 after hospitalization for CHF/Resp failure, was felt to have ongoing failure to thrive and during that admission, palliative care was consulted, she was made DNR and MOST Form suggested no further hospital admissions, now all this has been reversed and brought back to hospital for evaluation of anemia Coumadin was discontinued about 2 weeks ago. Gradual drop in Hb from 9.5/10 range last admit to 8 and 7.6 now, spoke to son who is an MD in [place] who insisted on GI workup upon admission   Assessment & Plan:   Anemia/Heme positive ostomy output -anemia panel suggests mild iron deficiency -no active bleeding, suspect some intermittent oozing around stoma site, no longer on coumadin -s/p 1 unit PRBC, Hb improving -son in [place] who is an MD and reportedly one of the decision makers for pt who was adamant abt having GI workup -GI consulted based on family demand, agree with plan for conservative management, due to risk of complications -Hb climbing with diuresis which suggests that low Hb on admission could have been due in part to hemodilution  H/o Colon CA -s/p hemicolectomy in [place] 2-3years ago and colostomy  Chronic Afib -in NSR now -Coumadin stopped by [name] few weeks ago -continue diltiazem and metoprolol  Acute on Chronic diastolic CHF -CXR with Fluid overload, continue IV lasix today despite rising Na  Hypernatremia -chronic, due to poor free water intake -clinically vol overloaded and CXR with edema, will give lasix today and monitor  CKD 3 -stable  Dysphagia  -on Dysphagia 1  diet with nectar thick liquids at SNF, resumed same -will ask SLP to re-assess  Debility, advanced age, ongoing failure to thrive, CHF, recurrent hospitalizations -declining functional and cognitive status -s/p DNR and MOST form filled last admit with plan for no Hospitalizations, now all this has been reversed by son who is an MD and next of kin  DVT prophylaxis: SCDs Code Status: Partial code Family Communication: daughter at bedside, called and d/w Dr.Carl Bukhari-son, he agreed to Partial Code  Disposition Plan: SNF when stable  Consultants:  GI  Subjective: -feels weak, some dyspnea  Objective: Vitals:   04/15/17 2000 04/16/17 0523 04/16/17 1225 04/16/17 1240  BP: (!) 141/83 135/85    Pulse: 86 91    Resp: 18 20    Temp: 98.3 F (36.8 C) 97.6 F (36.4 C)    TempSrc: Oral Oral    SpO2: 100% 100% 96% 96%  Weight:      Height:        Intake/Output Summary (Last 24 hours) at 04/16/17 1323 Last data filed at 04/16/17 0649  Gross per 24 hour  Intake                0 ml  Output             1100 ml  Net            -1100 ml   Filed Weights   04/13/17 2259 04/14/17 0027  Weight: 65.3 kg (144 lb) 68.3 kg (150 lb 9.2 oz)    Examination:   Gen: frail, elderly female, somnolent, no distress  HEENT: PERRLA, Neck supple, no JVD Lungs: few basilar crackles CVS: RRR,No Gallops,Rubs or new Murmurs Abd: soft, Non tender, non distended, BS present Extremities: No Cyanosis, Clubbing or edema Skin: no new rashes Psychiatry: flat affect    Data Reviewed:   CBC:  Recent Labs Lab 04/13/17 1906 04/14/17 0046 04/14/17 0459 04/14/17 0938 04/15/17 0601 04/16/17 0522  WBC 10.0 9.5 8.2 7.6 7.8 9.8  NEUTROABS 8.2*  --   --   --   --   --   HGB 8.5* 8.2* 7.6* 7.9* 9.6* 10.3*  HCT 29.2* 28.2* 26.3* 27.4* 32.9* 35.8*  MCV 96.4 95.9 96.3 95.5 95.9 98.4  PLT 237 244 231 239 218 225   Basic Metabolic Panel:  Recent Labs Lab 04/13/17 1906 04/14/17 0459 04/15/17 0601  04/16/17 0522  NA 143 148* 146* 149*  K 3.8 3.7 3.9 4.2  CL 96* 100* 100* 100*  CO2 37* 39* 39* 40*  GLUCOSE 131* 108* 118* 136*  BUN 39* 36* 37* 41*  CREATININE 1.17* 1.07* 1.14* 1.25*  CALCIUM 8.5* 8.4* 8.4* 8.5*   GFR: Estimated Creatinine Clearance: 26.1 mL/min (A) (by C-G formula based on SCr of 1.25 mg/dL (H)). Liver Function Tests:  Recent Labs Lab 04/13/17 1906  AST 31  ALT 23  ALKPHOS 78  BILITOT 0.5  PROT 7.5  ALBUMIN 3.1*   No results for input(s): LIPASE, AMYLASE in the last 168 hours. No results for input(s): AMMONIA in the last 168 hours. Coagulation Profile:  Recent Labs Lab 04/13/17 1906  INR 1.06   Cardiac Enzymes: No results for input(s): CKTOTAL, CKMB, CKMBINDEX, TROPONINI in the last 168 hours. BNP (last 3 results) No results for input(s): PROBNP in the last 8760 hours. HbA1C: No results for input(s): HGBA1C in the last 72 hours. CBG: No results for input(s): GLUCAP in the last 168 hours. Lipid Profile: No results for input(s): CHOL, HDL, LDLCALC, TRIG, CHOLHDL, LDLDIRECT in the last 72 hours. Thyroid Function Tests: No results for input(s): TSH, T4TOTAL, FREET4, T3FREE, THYROIDAB in the last 72 hours. Anemia Panel:  Recent Labs  04/13/17 1906  VITAMINB12 2,581*  FOLATE 9.9  FERRITIN 154  TIBC 321  IRON 34  RETICCTPCT 1.6   Urine analysis:    Component Value Date/Time   COLORURINE YELLOW 03/26/2017 1027   APPEARANCEUR CLEAR 03/26/2017 1027   LABSPEC 1.009 03/26/2017 1027   PHURINE 5.0 03/26/2017 1027   GLUCOSEU NEGATIVE 03/26/2017 1027   HGBUR NEGATIVE 03/26/2017 1027   BILIRUBINUR NEGATIVE 03/26/2017 1027   KETONESUR NEGATIVE 03/26/2017 1027   PROTEINUR NEGATIVE 03/26/2017 1027   NITRITE NEGATIVE 03/26/2017 1027   LEUKOCYTESUR NEGATIVE 03/26/2017 1027   Sepsis Labs: @LABRCNTIP (procalcitonin:4,lacticidven:4)  ) Recent Results (from the past 240 hour(s))  MRSA PCR Screening     Status: None   Collection Time: 04/14/17   1:30 AM  Result Value Ref Range Status   MRSA by PCR NEGATIVE NEGATIVE Final    Comment:        The GeneXpert MRSA Assay (FDA approved for NASAL specimens only), is one component of a comprehensive MRSA colonization surveillance program. It is not intended to diagnose MRSA infection nor to guide or monitor treatment for MRSA infections.          Radiology Studies: Dg Chest Port 1 View  Result Date: 04/16/2017 CLINICAL DATA:  Cough EXAM: PORTABLE CHEST 1 VIEW COMPARISON:  04/13/2017 FINDINGS: Moderate right pleural effusion and right lower lobe atelectasis unchanged. Palate moderate left pleural effusion and left lower  lobe atelectasis unchanged Pulmonary edema unchanged from the prior study. IMPRESSION: Congestive heart failure with edema and effusions unchanged. Electronically Signed   By: Marlan Palau M.D.   On: 04/16/2017 13:18        Scheduled Meds: . atorvastatin  10 mg Oral QHS  . diltiazem  120 mg Oral Daily  . feeding supplement (ENSURE ENLIVE)  15 mL Oral Daily  . ferrous sulfate  325 mg Oral Daily  . magnesium oxide  200 mg Oral Daily  . metoprolol succinate  50 mg Oral BID  . pantoprazole (PROTONIX) IV  40 mg Intravenous Q24H   Continuous Infusions:    LOS: 2 days    Time spent:    Zannie Cove, MD [facility] Pager [phone]  If 7PM-7AM, please contact night-coverage [email or web address] Password [email or web address] 04/16/2017, 1:23 PM

## 2017-04-16 NOTE — Progress Notes (Signed)
Subjective: No acute events.  Objective: Vital signs in last 24 hours: Temp:  [97.6 F (36.4 C)-98.3 F (36.8 C)] 98.1 F (36.7 C) (09/09 1325) Pulse Rate:  [86-98] 98 (09/09 1325) Resp:  [18-20] 18 (09/09 1325) BP: (135-146)/(83-93) 146/93 (09/09 1325) SpO2:  [91 %-100 %] 91 % (09/09 1325) Last BM Date: 04/16/17  Intake/Output from previous day: 09/08 0701 - 09/09 0700 In: 40 [P.O.:40] Out: 1100 [Urine:900] Intake/Output this shift: No intake/output data recorded.  General appearance: responds to questions, but very difficult to understand what she is saying GI: soft, non-tender; bowel sounds normal; no masses,  no organomegaly  Lab Results:  Recent Labs  04/14/17 0938 04/15/17 0601 04/16/17 0522  WBC 7.6 7.8 9.8  HGB 7.9* 9.6* 10.3*  HCT 27.4* 32.9* 35.8*  PLT 239 218 225   BMET  Recent Labs  04/14/17 0459 04/15/17 0601 04/16/17 0522  NA 148* 146* 149*  K 3.7 3.9 4.2  CL 100* 100* 100*  CO2 39* 39* 40*  GLUCOSE 108* 118* 136*  BUN 36* 37* 41*  CREATININE 1.07* 1.14* 1.25*  CALCIUM 8.4* 8.4* 8.5*   LFT  Recent Labs  04/13/17 1906  PROT 7.5  ALBUMIN 3.1*  AST 31  ALT 23  ALKPHOS 78  BILITOT 0.5   PT/INR  Recent Labs  04/13/17 1906  LABPROT 13.7  INR 1.06   Hepatitis Panel No results for input(s): HEPBSAG, HCVAB, HEPAIGM, HEPBIGM in the last 72 hours. C-Diff No results for input(s): CDIFFTOX in the last 72 hours. Fecal Lactopherrin No results for input(s): FECLLACTOFRN in the last 72 hours.  Studies/Results: Dg Chest Port 1 View  Result Date: 04/16/2017 CLINICAL DATA:  Cough EXAM: PORTABLE CHEST 1 VIEW COMPARISON:  04/13/2017 FINDINGS: Moderate right pleural effusion and right lower lobe atelectasis unchanged. Palate moderate left pleural effusion and left lower lobe atelectasis unchanged Pulmonary edema unchanged from the prior study. IMPRESSION: Congestive heart failure with edema and effusions unchanged. Electronically Signed   By:  Marlan Palauharles  Clark M.D.   On: 04/16/2017 13:18    Medications:  Scheduled: . atorvastatin  10 mg Oral QHS  . diltiazem  120 mg Oral Daily  . feeding supplement (ENSURE ENLIVE)  15 mL Oral Daily  . ferrous sulfate  325 mg Oral Daily  . furosemide  20 mg Intravenous Q12H  . magnesium oxide  200 mg Oral Daily  . metoprolol succinate  50 mg Oral BID  . pantoprazole (PROTONIX) IV  40 mg Intravenous Q24H   Continuous:   Assessment/Plan: 1) Anemia. 2) History of colon cancer. 3) Afib. 4) Stage 3 CKD. 5) Diastolic CHF.   Anemia improved with one unit of PRBC.    Plan: 1) No new recommendations.  LOS: 2 days   Latishia Suitt D 04/16/2017, 3:13 PM

## 2017-04-17 LAB — BASIC METABOLIC PANEL
ANION GAP: 10 (ref 5–15)
BUN: 50 mg/dL — AB (ref 6–20)
CHLORIDE: 100 mmol/L — AB (ref 101–111)
CO2: 41 mmol/L — ABNORMAL HIGH (ref 22–32)
Calcium: 8.8 mg/dL — ABNORMAL LOW (ref 8.9–10.3)
Creatinine, Ser: 1.45 mg/dL — ABNORMAL HIGH (ref 0.44–1.00)
GFR calc Af Amer: 35 mL/min — ABNORMAL LOW (ref >60)
GFR, EST NON AFRICAN AMERICAN: 30 mL/min — AB (ref >60)
GLUCOSE: 125 mg/dL — AB (ref 65–99)
POTASSIUM: 4.3 mmol/L (ref 3.5–5.1)
SODIUM: 151 mmol/L — AB (ref 135–145)

## 2017-04-17 MED ORDER — VITAMINS A & D EX OINT
TOPICAL_OINTMENT | CUTANEOUS | Status: AC
  Filled 2017-04-17: qty 5

## 2017-04-17 NOTE — Evaluation (Signed)
Clinical/Bedside Swallow Evaluation Patient Details  Name: Emily NettleMavis Lunz MRN: [id number] Date of Birth: 11/27/1920  Today's Date: 04/17/2017 Time: SLP Start Time (ACUTE ONLY): 1130 SLP Stop Time (ACUTE ONLY): 1159 SLP Time Calculation (min) (ACUTE ONLY): 29 min  Past Medical History:  Past Medical History:  Diagnosis Date  . Acute on chronic diastolic (congestive) heart failure (HCC)   . Anemia   . ARF (acute respiratory failure) (HCC)   . Atrial fibrillation (HCC)   . CKD (chronic kidney disease) stage 3, GFR 30-59 ml/min   . Cognitive communication deficit   . Deficiency of other specified B group vitamins   . Dysphagia   . Hyperlipemia   . Hypertension   . Muscle weakness (generalized)   . Other lack of coordination   . Respiratory failure with hypoxia and hypercapnia (HCC)   . Unsteadiness on feet   . Vitamin D deficiency    Past Surgical History:  Past Surgical History:  Procedure Laterality Date  . COLOSTOMY     HPI:  Emily Elliott is an 83F with chronic diastolic heart failure, CKD3, colon cancer status post partial colectomy with colostomy, P A. fib (Coumadin stopped 2 weeks ago) who presented to the hospital from a nursing facility with a low hemoglobin.   MOST form was previously filled out indicating comfort measures however appears plans/care plan was changed.   During hospital course, pt had mental status change with concern for possible new neuro event.  Swallow evaluation was subsequently ordered.  Pt was seen by SLP during August admission and MBS completed 03/15/17 with no aspiration with puree/thin diet. Per notes, pt was on a puree/nectar prior to admission.  8/20 showed right pleural effusion.    Assessment / Plan / Recommendation Clinical Impression  Pt fully awake today with open mouth posture due to weakness - excessive xerostomia noted.   SlP offered oral moisture via toothette.    Pt presents with symptoms of severe oropharyngeal dysphagia at this time with  concerns for difficulty managing secretions at this time.  She is grossly weak and even coughing/"hocking" secretions is fatiguing for her.  With maximum encouragement, pt was able to propel viscous secretions into oral cavity where she allowed SLP to suction to clear.    Pt stated she wants to drink liquids but does not desire to eat.  SlP provided her with only 1/2 tsp water and 1/2 tsp Magic Cup due to aspiration concerns.  Delayed oral transiting and swallow suspected with immediate throat clearing and delayed cough s/p delayed swallow.      SLP uncertain to impact of possible secretion retention on swallowing.  Pt reported sensation of residuals (? If food, liquid and/or secretions) on right side of throat - cues to turn right and swallow not helpful to decrease sensation.  Concern for airway protection is present of secretions and with any po intake.    Pt also repeatedly stated "I'm tired" during the session- suspect significant progression of baseline dysphagia - If pt is to consume po intake,  it should be for comfort - anticipate she will only desire small amounts of liquids.  Informed findings/concerns to pt, RN and MD.  Do not anticipate MBS would change outcomes for this 83 yo woman.    Will follow up x1 for family education. Rec npo x 1/2 tsp amounts of water only.   SLP Visit Diagnosis: Dysphagia, oropharyngeal phase (R13.12)    Aspiration Risk  Risk for inadequate nutrition/hydration;Severe aspiration risk  Diet Recommendation NPO (x 1/2 of water, fully upright)   Medication Administration: Via alternative means Postural Changes: Seated upright at 90 degrees;Remain upright for at least 30 minutes after po intake    Other  Recommendations Oral Care Recommendations: Oral care QID Other Recommendations: Have oral suction available   Follow up Recommendations None      Frequency and Duration min 1 x/week  1 week       Prognosis Prognosis for Safe Diet Advancement:  Guarded Barriers to Reach Goals: Severity of deficits;Time post onset      Swallow Study   General Date of Onset: 04/17/17 HPI: Emily Emily Elliott is an 83F with chronic diastolic heart failure, CKD3, colon cancer status post partial colectomy with colostomy, P A. fib (Coumadin stopped 2 weeks ago) who presented to the hospital from a nursing facility with a low hemoglobin.   MOST form was previously filled out indicating comfort measures however appears plans/care plan was changed.   During hospital course, pt had mental status change with concern for possible new neuro event.  Swallow evaluation was subsequently ordered.  Pt was seen by SLP during August admission and MBS completed 03/15/17 with no aspiration with puree/thin diet. Per notes, pt was on a puree/nectar prior to admission.  8/20 showed right pleural effusion.  Type of Study: Bedside Swallow Evaluation Diet Prior to this Study: Nectar-thick liquids;Dysphagia 1 (puree) Temperature Spikes Noted: No Respiratory Status: Nasal cannula History of Recent Intubation: No Behavior/Cognition: Alert Oral Cavity Assessment: Dry Oral Care Completed by SLP: Yes Oral Cavity - Dentition: Missing dentition Vision: Impaired for self-feeding Self-Feeding Abilities: Total assist Patient Positioning: Upright in bed Baseline Vocal Quality: Low vocal intensity Volitional Cough: Congested Volitional Swallow: Unable to elicit    Oral/Motor/Sensory Function Overall Oral Motor/Sensory Function: Generalized oral weakness (? increased weakness on labia left )   Ice Chips Ice chips: Not tested   Thin Liquid Thin Liquid: Impaired Presentation: Spoon (1/2 tsp water) Oral Phase Impairments: Reduced labial seal;Reduced lingual movement/coordination Oral Phase Functional Implications: Prolonged oral transit;Oral holding;Other (comment) (pt did orally suction) Pharyngeal  Phase Impairments: Suspected delayed Swallow;Throat Clearing - Immediate;Cough - Delayed     Nectar Thick Nectar Thick Liquid: Not tested   Honey Thick Honey Thick Liquid: Not tested   Puree Puree: Impaired Presentation: Spoon (1/4 tsp) Oral Phase Impairments: Reduced lingual movement/coordination;Reduced labial seal Oral Phase Functional Implications: Prolonged oral transit;Oral residue Pharyngeal Phase Impairments: Suspected delayed Swallow;Cough - Delayed;Throat Clearing - Immediate   Solid   GO   Solid: Not tested       Donavan Burnet, Emily [facility] SLP [phone]  Chales Abrahams 04/17/2017,1:21 PM

## 2017-04-17 NOTE — Care Management Note (Signed)
Case Management Note  Patient Details  Name: Emily Elliott MRN: [id number] Date of Birth: January 29, 1921  Subjective/Objective: 83 y/o f admitted w/Acute GIB. From [facility]. PT cons-recc SNF. Noted Partial code. Attending will discuss further w/family about treatment plan. CSW already following.                   Action/Plan:d/c plan SNF.   Expected Discharge Date:                  Expected Discharge Plan:  Skilled Nursing Facility  In-House Referral:  Clinical Social Work  Discharge planning Services  CM Consult  Post Acute Care Choice:    Choice offered to:     DME Arranged:    DME Agency:     HH Arranged:    HH Agency:     Status of Service:  In process, will continue to follow  If discussed at Long Length of Stay Meetings, dates discussed:    Additional Comments:  [name], Zofia Peckinpaugh, RN 04/17/2017, 1:11 PM

## 2017-04-17 NOTE — Progress Notes (Signed)
PROGRESS NOTE    Emily Elliott  ZOX:[id number] DOB: August 03, 1921 DOA: 04/13/2017 PCP: Jackie Plumsei-Bonsu, George, MD  Brief Narrative:Emily Elliott is a 11F with chronic diastolic heart failure, CKD3, colon cancer status post partial colectomy with colostomy, P A. fib (Coumadin stopped 2 weeks ago) who presented to the hospital from a nursing facility with a low hemoglobin. She was just discharged [facility] 8[facility] after hospitalization for CHF/Resp failure, was felt to have ongoing failure to thrive and during that admission, palliative care was consulted, she was made DNR and MOST Form suggested no further hospital admissions, now all this has been reversed and brought back to hospital for evaluation of anemia Coumadin was discontinued about 2 weeks ago. Gradual drop in Hb from 9.5/10 range last admit to 8 and 7.6 now, spoke to son who [place] an [place] in [place] who insisted on GI workup upon admission   Assessment & Plan:   Ongoing failure to thrive  -With CHF, worsening CHF, hypernatremia, CKD3-4, severe dysphagia, advanced age, severe malnutrition and very poor Po intake -after SLP eval today, recommended NPO/comfort feeds -I called con Dr.Tschetter and spoke to daughter in room and recommended Comfort care, comfort feeds etc -we discussed going Home with Hospice services and focusing on her Comfort only, her prognosis is likely 2-3weeks  Severe Dysphagia -worsening now -could have had a new CVA after stopping coumadin few weeks ago -now plan for comfort feeds and home with Hospice  Anemia/Heme positive ostomy output -anemia panel suggests mild iron deficiency -no active bleeding, suspect some intermittent oozing around stoma site, no longer on coumadin -s/p 1 unit PRBC, Hb improving -son in [place] who is an MD and reportedly one of the decision makers for pt who was adamant abt having GI workup -GI consulted based on family demand, agree with plan for conservative management, due to risk of  complications -Hb climbing with diuresis which suggests that low Hb on admission could have been due in part to hemodilution  H/o Colon CA -s/[place] hem[place] 2[place]years ago and colostomy  Chronic Afib -in NSR now -Coumadin stopped by [name] few weeks ago -continue diltiazem and metoprolol  Acute on Chronic diastolic CHF -CXR with Fluid overload, continue IV lasix today despite rising Na  Hypernatremia -chronic, due to poor free water intake -clinically vol overloaded and CXR with edema, hold lasix now, in light of climbing Na  CKD 3 -stable  Dysphagia  -on Dysphagia 1 diet with nectar thick liquids at SNF, resumed same -will ask SLP to re-assess  Debility, advanced age, ongoing failure to thrive, CHF, recurrent hospitalizations -declining functional and cognitive status -s/p DNR and MOST form filled last admit with plan for no Hospitalizations, now all this has been reversed by son who is an MD and next of kin -now again see discussions above, DNR and Home with Hospice tomorrow  DVT prophylaxis: SCDs Code Status: Partial code Family Communication: daughter at bedside, called and d/w Dr.Carl Tieu Disposition Plan: SNF when stable  Consultants:  GI  Subjective: -swallowing very weak/drooling, more lethargic than yesterday, but perks up from time to time  Objective: Vitals:   04/16/17 1325 04/16/17 2005 04/17/17 0427 04/17/17 1338  BP: (!) 146/93 (!) 143/82 (!) 143/82 (!) 157/92  Pulse: 98 (!) 101 97 99  Resp: 18 18 18 18   Temp: 98.1 F (36.7 C) 97.6 F (36.4 C) 98.4 F (36.9 C) 98.2 F (36.8 C)  TempSrc: Oral Oral Axillary Oral  SpO2: 91% 100% 97% 99%  Weight:      Height:        Intake/Output Summary (Last 24 hours) at 04/17/17 1446 Last data filed at 04/17/17 1300  Gross per 24 hour  Intake                0 ml  Output              450 ml  Net             -450 ml   Filed Weights   04/13/17 2259 04/14/17 0027  Weight: 65.3 kg (144 lb) 68.3 kg  (150 lb 9.2 oz)    Examination:  Gen: frail, elderly female, somnolent, no distress HEENT: PERRLA, Neck supple, no JVD Lungs: decreased BS at bases CVS: RRR,No Gallops,Rubs or new Murmurs Abd: soft, Non tender, non distended, BS present Extremities: 1plus edema Skin: no new rashes     Data Reviewed:   CBC:  Recent Labs Lab 04/13/17 1906 04/14/17 0046 04/14/17 0459 04/14/17 0938 04/15/17 0601 04/16/17 0522  WBC 10.0 9.5 8.2 7.6 7.8 9.8  NEUTROABS 8.2*  --   --   --   --   --   HGB 8.5* 8.2* 7.6* 7.9* 9.6* 10.3*  HCT 29.2* 28.2* 26.3* 27.4* 32.9* 35.8*  MCV 96.4 95.9 96.3 95.5 95.9 98.4  PLT 237 244 231 239 218 225   Basic Metabolic Panel:  Recent Labs Lab 04/13/17 1906 04/14/17 0459 04/15/17 0601 04/16/17 0522 04/17/17 0925  NA 143 148* 146* 149* 151*  K 3.8 3.7 3.9 4.2 4.3  CL 96* 100* 100* 100* 100*  CO2 37* 39* 39* 40* 41*  GLUCOSE 131* 108* 118* 136* 125*  BUN 39* 36* 37* 41* 50*  CREATININE 1.17* 1.07* 1.14* 1.25* 1.45*  CALCIUM 8.5* 8.4* 8.4* 8.5* 8.8*   GFR: Estimated Creatinine Clearance: 22.5 mL/min (A) (by C-G formula based on SCr of 1.45 mg/dL (H)). Liver Function Tests:  Recent Labs Lab 04/13/17 1906  AST 31  ALT 23  ALKPHOS 78  BILITOT 0.5  PROT 7.5  ALBUMIN 3.1*   No results for input(s): LIPASE, AMYLASE in the last 168 hours. No results for input(s): AMMONIA in the last 168 hours. Coagulation Profile:  Recent Labs Lab 04/13/17 1906  INR 1.06   Cardiac Enzymes: No results for input(s): CKTOTAL, CKMB, CKMBINDEX, TROPONINI in the last 168 hours. BNP (last 3 results) No results for input(s): PROBNP in the last 8760 hours. HbA1C: No results for input(s): HGBA1C in the last 72 hours. CBG:  Recent Labs Lab 04/16/17 1405  GLUCAP 152*   Lipid Profile: No results for input(s): CHOL, HDL, LDLCALC, TRIG, CHOLHDL, LDLDIRECT in the last 72 hours. Thyroid Function Tests: No results for input(s): TSH, T4TOTAL, FREET4, T3FREE,  THYROIDAB in the last 72 hours. Anemia Panel: No results for input(s): VITAMINB12, FOLATE, FERRITIN, TIBC, IRON, RETICCTPCT in the last 72 hours. Urine analysis:    Component Value Date/Time   COLORURINE YELLOW 03/26/2017 1027   APPEARANCEUR CLEAR 03/26/2017 1027   LABSPEC 1.009 03/26/2017 1027   PHURINE 5.0 03/26/2017 1027   GLUCOSEU NEGATIVE 03/26/2017 1027   HGBUR NEGATIVE 03/26/2017 1027   BILIRUBINUR NEGATIVE 03/26/2017 1027   KETONESUR NEGATIVE 03/26/2017 1027   PROTEINUR NEGATIVE 03/26/2017 1027   NITRITE NEGATIVE 03/26/2017 1027   LEUKOCYTESUR NEGATIVE 03/26/2017 1027   Sepsis Labs: (procalcitonin:4,lacticidven:4)  ) Recent Results (from the past 240 hour(s))  MRSA PCR Screening     Status: None   Collection Time: 04/14/17  1:30 AM  Result Value Ref Range Status   MRSA by PCR NEGATIVE NEGATIVE Final    Comment:        The GeneXpert MRSA Assay (FDA approved for NASAL specimens only), is one component of a comprehensive MRSA colonization surveillance program. It is not intended to diagnose MRSA infection nor to guide or monitor treatment for MRSA infections.          Radiology Studies: Dg Chest Port 1 View  Result Date: 04/16/2017 CLINICAL DATA:  Cough EXAM: PORTABLE CHEST 1 VIEW COMPARISON:  04/13/2017 FINDINGS: Moderate right pleural effusion and right lower lobe atelectasis unchanged. Palate moderate left pleural effusion and left lower lobe atelectasis unchanged Pulmonary edema unchanged from the prior study. IMPRESSION: Congestive heart failure with edema and effusions unchanged. Electronically Signed   By: Marlan Palau M.D.   On: 04/16/2017 13:18        Scheduled Meds: . atorvastatin  10 mg Oral QHS  . diltiazem  120 mg Oral Daily  . feeding supplement (ENSURE ENLIVE)  15 mL Oral Daily  . ferrous sulfate  325 mg Oral Daily  . magnesium oxide  200 mg Oral Daily  . metoprolol succinate  50 mg Oral BID  . pantoprazole (PROTONIX) IV  40  mg Intravenous Q24H   Continuous Infusions:    LOS: 3 days    Time spent:    Zannie Cove, MD [facility] Pager [phone]  If 7PM-7AM, please contact night-coverage [email or web address] Password [email or web address] 04/17/2017, 2:46 PM

## 2017-04-17 NOTE — Progress Notes (Signed)
OT Cancellation Note  Patient Details Name: Emily NettleMavis Elliott MRN: [id number] DOB: 22-Mar-1921   Cancelled Treatment:    Reason Eval/Treat Not Completed: Other (comment)  Noted pt from SNF and planning to return to SNF Will defer  [name] [place], [place] [phone] 04/17/2017, 9:07 AM

## 2017-04-17 NOTE — Care Management Note (Signed)
Case Management Note  Patient Details  Name: Emily Elliott MRN: [id number] Date of Birth: Sep 24, 1920  Subjective/Objective:  Referral for home hospice choice-TC son Emily Elliott he asked that I talk to him later-I provided him w/my tel # await call back.                  Action/Plan:d/c plan home w/hospice @ home.   Expected Discharge Date:                  Expected Discharge Plan:  Home w Hospice Care  In-House Referral:  Clinical Social Work  Discharge planning Services  CM Consult  Post Acute Care Choice:    Choice offered to:     DME Arranged:    DME Agency:     HH Arranged:    HH Agency:     Status of Service:  In process, will continue to follow  If discussed at Long Length of Stay Meetings, dates discussed:    Additional Comments:  Emily Elliott, Emily Cruickshank, RN 04/17/2017, 3:56 PM

## 2017-04-17 NOTE — Progress Notes (Signed)
Received call back from son-Emily Elliott-confirmed his agreement to discuss home hospice services per his conversation w/attending. He deferred me to talk to his sister Emily Elliott about home hospice providers, & dme-TC Emily Elliott c#[phone]-she had many questions, & concerns about palliative care services vs hospice services-then she wanted her other sister Emily Elliott to also listen in on conversation-they both are not in agreement to home w/hospice-they did agree to talking to 3 home hospice providers-[phone] informed to contact Emily Elliott,[facility] Emily Elliott c#[phone], & [facility]-referral line tel#[phone]-they all have reached out to Emily Elliott-they will f/u in am since sisters currently need more info about palliative vs hospice. Nurse informed of current status-she will contact attending to update.

## 2017-04-17 NOTE — Progress Notes (Signed)
    Progress Note   Subjective  Chief Complaint: Anemia  Patient found laying in bed this morning alone, she is unable to answer many of my questions but denies any complaints or abdominal pain.   Objective   Vital signs in last 24 hours: Temp:  [97.6 F (36.4 C)-98.4 F (36.9 C)] 98.4 F (36.9 C) (09/10 0427) Pulse Rate:  [97-101] 97 (09/10 0427) Resp:  [18] 18 (09/10 0427) BP: (143-146)/(82-93) 143/82 (09/10 0427) SpO2:  [91 %-100 %] 97 % (09/10 0427) Last BM Date: 04/16/17 General:    AA female in NAD Heart:  Regular rate and rhythm; no murmurs Lungs: Respirations even and unlabored, lungs CTA bilaterally Abdomen:  Soft, nontender and nondistended. Normal bowel sounds. Colostomy bag found unattached with solid brown stool inside Extremities:  Without edema. Neurologic:  Alert and oriented,  grossly normal neurologically. Psych:  Cooperative. Normal mood and affect.  Intake/Output from previous day: 09/09 0701 - 09/10 0700 In: -  Out: 450 [Urine:450]  Lab Results:  Recent Labs  04/15/17 0601 04/16/17 0522  WBC 7.8 9.8  HGB 9.6* 10.3*  HCT 32.9* 35.8*  PLT 218 225   BMET  Recent Labs  04/15/17 0601 04/16/17 0522  NA 146* 149*  K 3.9 4.2  CL 100* 100*  CO2 39* 40*  GLUCOSE 118* 136*  BUN 37* 41*  CREATININE 1.14* 1.25*  CALCIUM 8.4* 8.5*   Studies/Results: Dg Chest Port 1 View  Result Date: 04/16/2017 CLINICAL DATA:  Cough EXAM: PORTABLE CHEST 1 VIEW COMPARISON:  04/13/2017 FINDINGS: Moderate right pleural effusion and right lower lobe atelectasis unchanged. Palate moderate left pleural effusion and left lower lobe atelectasis unchanged Pulmonary edema unchanged from the prior study. IMPRESSION: Congestive heart failure with edema and effusions unchanged. Electronically Signed   By: Marlan Palauharles  Clark M.D.   On: 04/16/2017 13:18       Assessment / Plan:   Assessment: 1. Anemia: Hemoglobin stable at 10.3 after 1 unit of PRBCs, no further signs of GI  bleeding 2. History of colon cancer 3. A. fib 4. Stage III COPD  5. Diastolic CHF  Plan: 1. No new recommendations 2. We may sign off today, please await recommendations from Dr. Myrtie Neitheranis  Discharge Planning Diet:pureed regular Anticoagulation and antiplatelets:Hold Discharge Medications: No new Follow up: None needed  Procedure Procedures planned and timing of procedure: None Lab work ordered: None Hold the following anticoagulation and/or antiplatelets for procedure? N/A  Thank you for your kind consultation.   LOS: 3 days   Unk LightningJennifer Lynne Lemmon  04/17/2017, 9:53 AM  Pager # [phone]  I have discussed the case with the PA, and that is the plan I formulated. I personally interviewed and examined the patient.  I also feel that this patient's age and debility make the risks of endoscopic procedures outweigh the expected benefit. Dr. Jomarie LongsJoseph concurs and plans to speak to the patient's family about this more in depth.  We will sign off and see if called for change in clinical condition.    Charlie PitterHenry L Danis III Pager [phone]  Mon-Fri 8a-5p [phone] after 5p, weekends, holidays

## 2017-04-18 DIAGNOSIS — I10 Essential (primary) hypertension: Secondary | ICD-10-CM

## 2017-04-18 NOTE — Care Management Important Message (Signed)
Important Message  Patient Details  Name: Emily Elliott MRN: [id number] Date of Birth: August 10, 1920   Medicare Important Message Given:  Yes    [name], [place] Mielke 04/18/2017, 11:17 AMImportant Message  Patient Details  Name: Emily Elliott MRN: [id number] Date of Birth: August 10, 1920   Medicare Important Message Given:  Yes    [name], Jaylenn Baiza 04/18/2017, 11:17 AM

## 2017-04-18 NOTE — Progress Notes (Signed)
Still awaiting palliative care meeting outcome & recc to assist with d/c plans. Per attending request CM to wait until outcome to complete d/c plan process if further steps to be taken with medicare regulations-med advisor also updated & agreed. See prior note for current d/c plan if home w/hospice. Per dtr(Mayleen) if home patient address:[address] [place] [id number].

## 2017-04-18 NOTE — Progress Notes (Signed)
PROGRESS NOTE    Emily Elliott  ZOX:[id number] DOB: 17-Jun-1921 DOA: 04/13/2017 PCP: Jackie Plum, MD  Brief Narrative:Emily Elliott is a 10F with chronic diastolic heart failure, CKD3, colon cancer status post partial colectomy with colostomy, P A. fib (Coumadin stopped 2 weeks ago) who presented to the hospital from a nursing facility with a low hemoglobin. She was just discharged from [facility] 8/[facility] after hospitalization for CHF/Resp failure, was felt to have ongoing failure to thrive and during that admission, palliative care was consulted, she was made DNR and MOST Form suggested no further hospital admissions, now all this has been reversed and brought back to hospital for evaluation of anemia Coumadin was discontinued about 2 weeks ago. Gradual drop in Hb from 9.5/10 range last admit to 8 and 7.6 now, spoke to son who is an MD in [place] who insisted on GI workup upon admission   Assessment & Plan:   Ongoing failure to thrive  -With declining functional status, worsening CHF, hypernatremia, CKD3-4, severe dysphagia, advanced age, severe malnutrition and very poor Po intake -after SLP eval, dysphagia felt to be much worse and recommended NPO/comfort feeds -9/10 I called con Dr.Takemoto and spoke to daughter in room and recommended Comfort care, comfort feeds etc, we discussed going Home with Hospice services and focusing on her Comfort only and DNR -this morning daughter in [place] and [place] after talking to Hospice Liasons they seem unrealistic and seem to expect her to recover from everything and want Rehab /Palliative care -son Dr.Grygiel agreed to home with Hospice yesterday but daughters ae not in agreement, will ask Palliative to help with this challenging situation and help Emily Elliott from suffering  Severe Dysphagia -worsening now -could have had a new CVA after stopping coumadin few weeks ago -recommended comfort feeds and home with Hospice, did not pursue CVA/MRI workup now due to  futility -continue comfort feeds with D1 diet, severe aspiration risks explained to family yesterday  Anemia/Heme positive ostomy output -anemia panel suggests mild iron deficiency -no active bleeding, suspect some intermittent oozing around stoma site, no longer on coumadin -s/p 1 unit PRBC, Hb improved and stable -son in [place] who is an MD and reportedly one of the decision makers for pt who was adamant abt having GI workup initially, GI consulted based on family demand, agree with plan for conservative management, due to risk of complications -Hb climbing with diuresis which suggests that low Hb on admission could have been due in part to hemodilution  H/o Colon CA -s/p hemicolectomy in [place] 2-3years ago and colostomy  Chronic Afib -in NSR now -Coumadin stopped by [name] few weeks ago -continue diltiazem and metoprolol  Acute on Chronic diastolic CHF -CXR with Fluid overload, continue IV lasix today despite rising Na  Hypernatremia -chronic, due to poor free water intake -clinically vol overloaded and CXR with edema, hold lasix now, in light of climbing Na  CKD 3 -stable  Dysphagia  -on Dysphagia 1 diet with nectar thick liquids at SNF, resumed same -will ask SLP to re-assess  Debility, advanced age, ongoing failure to thrive, CHF, recurrent hospitalizations -declining functional and cognitive status -s/p DNR and MOST form filled last admit with plan for no Hospitalizations, all this was reversed and admitted, now see discussions above  DVT prophylaxis: SCDs Code Status: DNR Family Communication: daughter at bedside, called and d/w Dr.Carl Spinella Disposition Plan: SNF when stable  Consultants:  GI  Subjective: -didn't take any Po yesterday per RN, stable, slightly more alert today  Objective: Vitals:   04/17/17 0427 04/17/17 1338 04/17/17 2119 04/18/17 0504  BP: (!) 143/82 (!) 157/92 (!) 143/95 (!) 143/96  Pulse: 97 99 94 (!) 102  Resp: 18 18 20 20   Temp: 98.4 F  (36.9 C) 98.2 F (36.8 C) 99.7 F (37.6 C) 98.9 F (37.2 C)  TempSrc: Axillary Oral Oral Oral  SpO2: 97% 99% 100% 98%  Weight:      Height:        Intake/Output Summary (Last 24 hours) at 04/18/17 0941 Last data filed at 04/18/17 0600  Gross per 24 hour  Intake                0 ml  Output              900 ml  Net             -900 ml   Filed Weights   04/13/17 2259 04/14/17 0027  Weight: 65.3 kg (144 lb) 68.3 kg (150 lb 9.2 oz)    Examination:  Gen: frail elderly female, no distress, oriented to self and partly to place HEENT: PERRLA, Neck supple, no JVD Lungs: decreased BS at bases CVS: RRR,No Gallops,Rubs or new Murmurs Abd: soft, Non tender, non distended, BS present Extremities: No Cyanosis, Clubbing or edema Skin: no new rashes   Data Reviewed:   CBC:  Recent Labs Lab 04/13/17 1906 04/14/17 0046 04/14/17 0459 04/14/17 0938 04/15/17 0601 04/16/17 0522  WBC 10.0 9.5 8.2 7.6 7.8 9.8  NEUTROABS 8.2*  --   --   --   --   --   HGB 8.5* 8.2* 7.6* 7.9* 9.6* 10.3*  HCT 29.2* 28.2* 26.3* 27.4* 32.9* 35.8*  MCV 96.4 95.9 96.3 95.5 95.9 98.4  PLT 237 244 231 239 218 225   Basic Metabolic Panel:  Recent Labs Lab 04/13/17 1906 04/14/17 0459 04/15/17 0601 04/16/17 0522 04/17/17 0925  NA 143 148* 146* 149* 151*  K 3.8 3.7 3.9 4.2 4.3  CL 96* 100* 100* 100* 100*  CO2 37* 39* 39* 40* 41*  GLUCOSE 131* 108* 118* 136* 125*  BUN 39* 36* 37* 41* 50*  CREATININE 1.17* 1.07* 1.14* 1.25* 1.45*  CALCIUM 8.5* 8.4* 8.4* 8.5* 8.8*   GFR: Estimated Creatinine Clearance: 22.5 mL/min (A) (by C-G formula based on SCr of 1.45 mg/dL (H)). Liver Function Tests:  Recent Labs Lab 04/13/17 1906  AST 31  ALT 23  ALKPHOS 78  BILITOT 0.5  PROT 7.5  ALBUMIN 3.1*   No results for input(s): LIPASE, AMYLASE in the last 168 hours. No results for input(s): AMMONIA in the last 168 hours. Coagulation Profile:  Recent Labs Lab 04/13/17 1906  INR 1.06   Cardiac  Enzymes: No results for input(s): CKTOTAL, CKMB, CKMBINDEX, TROPONINI in the last 168 hours. BNP (last 3 results) No results for input(s): PROBNP in the last 8760 hours. HbA1C: No results for input(s): HGBA1C in the last 72 hours. CBG:  Recent Labs Lab 04/16/17 1405  GLUCAP 152*   Lipid Profile: No results for input(s): CHOL, HDL, LDLCALC, TRIG, CHOLHDL, LDLDIRECT in the last 72 hours. Thyroid Function Tests: No results for input(s): TSH, T4TOTAL, FREET4, T3FREE, THYROIDAB in the last 72 hours. Anemia Panel: No results for input(s): VITAMINB12, FOLATE, FERRITIN, TIBC, IRON, RETICCTPCT in the last 72 hours. Urine analysis:    Component Value Date/Time   COLORURINE YELLOW 03/26/2017 1027   APPEARANCEUR CLEAR 03/26/2017 1027   LABSPEC 1.009 03/26/2017 1027   PHURINE 5.0 03/26/2017  1027   GLUCOSEU NEGATIVE 03/26/2017 1027   HGBUR NEGATIVE 03/26/2017 1027   BILIRUBINUR NEGATIVE 03/26/2017 1027   KETONESUR NEGATIVE 03/26/2017 1027   PROTEINUR NEGATIVE 03/26/2017 1027   NITRITE NEGATIVE 03/26/2017 1027   LEUKOCYTESUR NEGATIVE 03/26/2017 1027   Sepsis Labs: (procalcitonin:4,lacticidven:4)  ) Recent Results (from the past 240 hour(s))  MRSA PCR Screening     Status: None   Collection Time: 04/14/17  1:30 AM  Result Value Ref Range Status   MRSA by PCR NEGATIVE NEGATIVE Final    Comment:        The GeneXpert MRSA Assay (FDA approved for NASAL specimens only), is one component of a comprehensive MRSA colonization surveillance program. It is not intended to diagnose MRSA infection nor to guide or monitor treatment for MRSA infections.          Radiology Studies: Dg Chest Port 1 View  Result Date: 04/16/2017 CLINICAL DATA:  Cough EXAM: PORTABLE CHEST 1 VIEW COMPARISON:  04/13/2017 FINDINGS: Moderate right pleural effusion and right lower lobe atelectasis unchanged. Palate moderate left pleural effusion and left lower lobe atelectasis unchanged Pulmonary  edema unchanged from the prior study. IMPRESSION: Congestive heart failure with edema and effusions unchanged. Electronically Signed   By: Marlan Palau M.D.   On: 04/16/2017 13:18        Scheduled Meds: . diltiazem  120 mg Oral Daily  . feeding supplement (ENSURE ENLIVE)  15 mL Oral Daily  . magnesium oxide  200 mg Oral Daily  . metoprolol succinate  50 mg Oral BID   Continuous Infusions:    LOS: 4 days    Time spent:    Zannie Cove, MD [facility] Pager [phone]  If 7PM-7AM, please contact night-coverage [email or web address] Password [facility] 04/18/2017, 9:41 AM

## 2017-04-18 NOTE — Consult Note (Signed)
[facility]  [facility] us to reach out to the daughter [name] to explain difference between our palliative in home program and hospice care in home. I spoke to the daughter on the number she provided for us and she was hoping to conference in her sister so that it could be a 3 way conversation. However her sister wasn't able to talk at this time so [name] is going to call back when both can be on line. We will be happy to help anyway that we can. Thank you for contacting us and allowing us to partner with you and our community to help serve this pt. Norm ParcelCheri Kennedy RN [phone]

## 2017-04-18 NOTE — Progress Notes (Signed)
Per Asst Director(Sally)-follow process in am-Noted palliative to meet on 04/19/16 @ 1p.

## 2017-04-18 NOTE — Progress Notes (Signed)
  Speech Language Pathology Treatment: Dysphagia  Patient Details Name: Emily Elliott MRN: [id number] DOB: [birth year] Today's Date: 04/18/2017 Time: [id number] SLP Time Calculation (min) (ACUTE ONLY): 15 min  Assessment / Plan / Recommendation Clinical Impression  Pt sleepy with open mouth posture and gross weakness.  SlP assisted pt to "brush" her tongue with a soft toothbrush to try to clear lingual layer.  Pt required total cues to seal lips on suction with oral care and po trial.  RN reports pt tolerated po medication today with applesauce - ? If pt fatigued this pm.  Pt did report sensation of secretions in pharynx with direct question cue - voice remained weak but clear- no congestion heard.    SlP provided pt with po trial of honey/nectar thickened water per her request.  Excessive oral holding - anterior- noted without transiting boluses despite max cues.   SLP used oral suction to clear mouth due to aspiration risk.    Oral care/moisture provided comfort to this patient - suspect intake will not be comforting and should not be provided unless pt desires due to discomfort risk with aspiration.   Posted signs in room to explain recommendationss and spoke to RN as no family has been present during prior session.  SLP to sign off.     HPI HPI: Ms Emily Elliott is a 46F with chronic diastolic heart failure, CKD3, colon cancer status post partial colectomy with colostomy, P A. fib (Coumadin stopped 2 weeks ago) who presented to the hospital from a nursing facility with a low hemoglobin.   MOST form was previously filled out indicating comfort measures however appears plans/care plan was changed.   During hospital course, pt had mental status change with concern for possible new neuro event.  Swallow evaluation was subsequently ordered.  Pt was seen by SLP during August admission and MBS completed 03/15/17 with no aspiration with puree/thin diet. Per notes, pt was on a puree/nectar prior to admission.  8/20  showed right pleural effusion.       SLP Plan  Discharge SLP treatment due to (comment) (pt continues with severe dysphagia, recommendations for maximal airway protection/pt comfort in place; no slp follow up needed)       Recommendations  Diet recommendations: Nectar-thick liquid;Other(comment) (recommend consider full liquid due to increased effort/pharyngeal contraction required to clear pudding) Liquids provided via: Teaspoon Medication Administration: Via alternative means Supervision: Full supervision/cueing for compensatory strategies Compensations: Small sips/bites (oral suction if pt does not swallow!) Postural Changes and/or Swallow Maneuvers: Upright 30-60 min after meal;Seated upright 90 degrees                Follow up Recommendations: None SLP Visit Diagnosis: Dysphagia, oropharyngeal phase (R13.12) Plan: Discharge SLP treatment due to (comment) (pt continues with severe dysphagia, recommendations for maximal airway protection/pt comfort in place; no slp follow up needed)       GO               Emily Burnetamara Gaspare Netzel, MS [facility] SLP [phone]  Emily Elliott, Emily Elliott 04/18/2017, 5:28 PM

## 2017-04-18 NOTE — Progress Notes (Signed)
Physical Therapy Discharge Patient Details Name: Emily NettleMavis Elliott MRN: [id number] DOB: 02-15-1921 Today's Date: 04/18/2017 Time:  -     Patient discharged from PT services secondary to medical decline - patient requires total care, Hospice is being considered by family.  Please see latest therapy progress note for current level of functioning and progress toward goals.         Sharen HeckHill, Emily Elliott Emily Elliott PT [phone]  04/18/2017, 8:03 AM

## 2017-04-19 DIAGNOSIS — N179 Acute kidney failure, unspecified: Secondary | ICD-10-CM

## 2017-04-19 DIAGNOSIS — N183 Chronic kidney disease, stage 3 (moderate): Secondary | ICD-10-CM

## 2017-04-19 DIAGNOSIS — R627 Adult failure to thrive: Secondary | ICD-10-CM

## 2017-04-19 DIAGNOSIS — Z515 Encounter for palliative care: Secondary | ICD-10-CM

## 2017-04-19 DIAGNOSIS — Z7189 Other specified counseling: Secondary | ICD-10-CM

## 2017-04-19 DIAGNOSIS — K922 Gastrointestinal hemorrhage, unspecified: Principal | ICD-10-CM

## 2017-04-19 LAB — BLOOD GAS, ARTERIAL
ACID-BASE EXCESS: 12.2 mmol/L — AB (ref 0.0–2.0)
Bicarbonate: 44.3 mmol/L — ABNORMAL HIGH (ref 20.0–28.0)
Drawn by: 103107
O2 CONTENT: 2 L/min
O2 SAT: 98.2 %
PATIENT TEMPERATURE: 98.6
PCO2 ART: 108 mmHg — AB (ref 32.0–48.0)
PO2 ART: 133 mmHg — AB (ref 83.0–108.0)
pH, Arterial: 7.237 — ABNORMAL LOW (ref 7.350–7.450)

## 2017-04-19 LAB — BASIC METABOLIC PANEL
Anion gap: 9 (ref 5–15)
BUN: 62 mg/dL — AB (ref 6–20)
CALCIUM: 8.8 mg/dL — AB (ref 8.9–10.3)
CO2: 44 mmol/L — AB (ref 22–32)
CREATININE: 1.6 mg/dL — AB (ref 0.44–1.00)
Chloride: 102 mmol/L (ref 101–111)
GFR calc non Af Amer: 27 mL/min — ABNORMAL LOW (ref >60)
GFR, EST AFRICAN AMERICAN: 31 mL/min — AB (ref >60)
GLUCOSE: 127 mg/dL — AB (ref 65–99)
Potassium: 4.3 mmol/L (ref 3.5–5.1)
Sodium: 155 mmol/L — ABNORMAL HIGH (ref 135–145)

## 2017-04-19 LAB — CBC
HEMATOCRIT: 37.7 % (ref 36.0–46.0)
Hemoglobin: 10.4 g/dL — ABNORMAL LOW (ref 12.0–15.0)
MCH: 28.2 pg (ref 26.0–34.0)
MCHC: 27.6 g/dL — AB (ref 30.0–36.0)
MCV: 102.2 fL — ABNORMAL HIGH (ref 78.0–100.0)
Platelets: 228 10*3/uL (ref 150–400)
RBC: 3.69 MIL/uL — ABNORMAL LOW (ref 3.87–5.11)
RDW: 16.2 % — AB (ref 11.5–15.5)
WBC: 7.9 10*3/uL (ref 4.0–10.5)

## 2017-04-19 LAB — GLUCOSE, CAPILLARY: Glucose-Capillary: 130 mg/dL — ABNORMAL HIGH (ref 65–99)

## 2017-04-19 MED ORDER — HALOPERIDOL LACTATE 5 MG/ML IJ SOLN: 1.0000 mg | Freq: Four times a day (QID) | INTRAMUSCULAR | Status: DC | PRN

## 2017-04-19 MED ORDER — PANTOPRAZOLE SODIUM 40 MG IV SOLR
40.0000 mg | Freq: Two times a day (BID) | INTRAVENOUS | Status: DC
  Administered 2017-04-19 – 2017-04-20 (×2): 40 mg via INTRAVENOUS
  Filled 2017-04-19 (×2): qty 40

## 2017-04-19 MED ORDER — DEXTROSE IN LACTATED RINGERS 5 % IV SOLN
INTRAVENOUS | Status: DC
  Administered 2017-04-20: 09:00:00 via INTRAVENOUS

## 2017-04-19 MED ORDER — DILTIAZEM HCL 100 MG IV SOLR
5.0000 mg/h | INTRAVENOUS | Status: DC
  Administered 2017-04-19: 5 mg/h via INTRAVENOUS
  Filled 2017-04-19: qty 100

## 2017-04-19 MED ORDER — ACETAMINOPHEN 10 MG/ML IV SOLN
1000.0000 mg | Freq: Four times a day (QID) | INTRAVENOUS | Status: AC | PRN
  Filled 2017-04-19: qty 100

## 2017-04-19 NOTE — Progress Notes (Addendum)
PROGRESS NOTE    Emily Elliott  YQM:[id number] DOB: 1921-01-20 DOA: 04/13/2017 PCP: [name], MD  Brief Narrative:Emily Elliott is a 41F with chronic diastolic heart failure, CKD3, colon cancer status post partial colectomy with colostomy, P A. fib (Coumadin stopped 2 weeks ago) who presented to the hospital from a nursing facility with a low hemoglobin. She was just discharged from [facility] 8/24 after hospitalization for CHF/Resp failure, was felt to have ongoing failure to thrive and during that admission, palliative care was consulted, she was made DNR and MOST Form suggested no further hospital admissions, now all this has been reversed and brought back to hospital for evaluation of anemia Coumadin was discontinued about 2 weeks ago. Gradual drop in Hb from 9.5/10 range last admit to 8 and 7.6 now, spoke to son who is an MD in [place] who insisted on GI workup upon admission   Assessment & Plan:   Ongoing failure to thrive  -With declining functional status, worsening CHF, hypernatremia, CKD3-4, severe dysphagia, advanced age, severe malnutrition and very poor Po intake -after SLP eval, dysphagia felt to be much worse and recommended NPO/comfort feeds -9/10 Dr Broadus John called  Dr.Briner and spoke to daughter in room and recommended Comfort care, comfort feeds etc, we discussed going Home with Hospice services and focusing on her Comfort only and DNR -this morning daughter in [place] and [place] after talking to [place] they seem unrealistic and seem to expect her to recover from everything and want Rehab /Palliative care -Family met with palliative and daughters change Code status to Full code. I had family meeting, one daughter at bedside, son and second daughter over phone. They have decide to proceed with partial Code status; no intubation, no CPR. Yes; to medications, defibrillation, Oxygen,..see how patient respond with IV fluids. They will consider palliative/hospice  at discharge.    I have change Code status to Partial.   Acute encephalopathy;  Multifactorial, related to hypernatremia, renal failure.  Discussed with son, will get ABG and use BIPAP as needed.  Patient is partial CODE. Son is ok with BIPAP. ABG was ordered, showed hypercapnia. BIPAP ordered. Transfer to step down unit.  Will need further goals of care.   Hypernatremia -chronic, due to poor free water intake -discussed with family, will start gentle IV fluids. Will monitor closely respiratory status.  Patient with no edema lower extremity, tongue is dry.  Severe Dysphagia -worsening now -could have had a new CVA after stopping coumadin few weeks ago -recommended comfort feeds and home with Hospice, did not pursue CVA/MRI workup now due to futility -continue comfort feeds with D1 diet, severe aspiration risks explained to family yesterday -discussed today with family, they understand risk for aspiration, They would like to continue with dysphagia diet if Patient is alert enough to eat. .   Anemia/Heme positive ostomy output -anemia panel suggests mild iron deficiency -no active bleeding, suspect some intermittent oozing around stoma site, no longer on coumadin -s/p 1 unit PRBC, Hb improved and stable -son in [place] who is an MD and reportedly one of the decision makers for pt who was adamant abt having GI workup initially, GI consulted based on family demand, agree with plan for conservative management, due to risk of complications -Hb climbing with diuresis which suggests that low Hb on admission could have been due in part to hemodilution  H/o Colon CA -s/p hemicolectomy in [place] 2-3years ago and colostomy  Chronic Afib -Coumadin stopped by [name] few weeks ago -continue diltiazem  and metoprolol -unable to take oral Cardizem today. Will start IV Cardizem.   Acute on Chronic diastolic CHF -CXR with Fluid overload, lasix on hold due to hypernatremia and renal failure.    Acute CKD 3 -worsening  renal function, related to dehydration. Poor oral intake.   Dysphagia  -on Dysphagia 1 diet with nectar thick liquids at SNF, resumed same -will ask SLP to re-assess  Debility, advanced age, ongoing failure to thrive, CHF, recurrent hospitalizations -declining functional and cognitive status -s/p DNR and MOST form filled last admit with plan for no Hospitalizations, all this was reversed and admitted, now see discussions above  Epigastric pain;  Start IV protonix in case of gastritis.  IV tylenol for pain.   DVT prophylaxis: SCDs Code Status: DNR Family Communication: daughter at bedside,  d/w Dr.Carl Bedore and second daughter over phone.  Disposition Plan: SNF when stable  Consultants:  GI  Subjective: -sleepy, lethargic, complaints of epigastric pain.   Objective: Vitals:   04/18/17 1044 04/18/17 1410 04/18/17 2153 04/19/17 0558  BP:  (!) 162/94 (!) 146/86 (!) 142/76  Pulse: (!) 120 (!) 114 98 (!) 106  Resp:  (!) 24 (!) 24 (!) 22  Temp:  98.7 F (37.1 C) 98.1 F (36.7 C) 98.1 F (36.7 C)  TempSrc:  Oral Oral Oral  SpO2:  94% 100% 100%  Weight:      Height:        Intake/Output Summary (Last 24 hours) at 04/19/17 1518 Last data filed at 04/19/17 1328  Gross per 24 hour  Intake                0 ml  Output              700 ml  Net             -700 ml   Filed Weights   04/13/17 2259 04/14/17 0027  Weight: 65.3 kg (144 lb) 68.3 kg (150 lb 9.2 oz)    Examination:  Gen: lethargic,  HEENT: PERRLA, Neck supple, no JVD, MMM dry.  Lungs: decreased BS at bases CVS: S 1, S 2 IRR, tachycardic Abd: Soft, mild tenderness, no rigidity  Extremities: no edema, no cyanosis.  Skin: no new rashes, dry skin.    Data Reviewed:   CBC:  Recent Labs Lab 04/13/17 1906  04/14/17 0459 04/14/17 7858 04/15/17 0601 04/16/17 0522 04/19/17 0614  WBC 10.0  < > 8.2 7.6 7.8 9.8 7.9  NEUTROABS 8.2*  --   --   --   --   --   --   HGB 8.5*  < > 7.6* 7.9* 9.6* 10.3* 10.4*    HCT 29.2*  < > 26.3* 27.4* 32.9* 35.8* 37.7  MCV 96.4  < > 96.3 95.5 95.9 98.4 102.2*  PLT 237  < > 231 239 218 225 228  < > = values in this interval not displayed. Basic Metabolic Panel:  Recent Labs Lab 04/14/17 0459 04/15/17 0601 04/16/17 0522 04/17/17 0925 04/19/17 0614  NA 148* 146* 149* 151* 155*  K 3.7 3.9 4.2 4.3 4.3  CL 100* 100* 100* 100* 102  CO2 39* 39* 40* 41* 44*  GLUCOSE 108* 118* 136* 125* 127*  BUN 36* 37* 41* 50* 62*  CREATININE 1.07* 1.14* 1.25* 1.45* 1.60*  CALCIUM 8.4* 8.4* 8.5* 8.8* 8.8*   GFR: Estimated Creatinine Clearance: 20.4 mL/min (A) (by C-G formula based on SCr of 1.6 mg/dL (H)). Liver Function Tests:  Recent Labs Lab  04/13/17 1906  AST 31  ALT 23  ALKPHOS 78  BILITOT 0.5  PROT 7.5  ALBUMIN 3.1*   No results for input(s): LIPASE, AMYLASE in the last 168 hours. No results for input(s): AMMONIA in the last 168 hours. Coagulation Profile:  Recent Labs Lab 04/13/17 1906  INR 1.06   Cardiac Enzymes: No results for input(s): CKTOTAL, CKMB, CKMBINDEX, TROPONINI in the last 168 hours. BNP (last 3 results) No results for input(s): PROBNP in the last 8760 hours. HbA1C: No results for input(s): HGBA1C in the last 72 hours. CBG:  Recent Labs Lab 04/16/17 1405 04/19/17 1050  GLUCAP 152* 130*   Lipid Profile: No results for input(s): CHOL, HDL, LDLCALC, TRIG, CHOLHDL, LDLDIRECT in the last 72 hours. Thyroid Function Tests: No results for input(s): TSH, T4TOTAL, FREET4, T3FREE, THYROIDAB in the last 72 hours. Anemia Panel: No results for input(s): VITAMINB12, FOLATE, FERRITIN, TIBC, IRON, RETICCTPCT in the last 72 hours. Urine analysis:    Component Value Date/Time   COLORURINE YELLOW 03/26/2017 1027   APPEARANCEUR CLEAR 03/26/2017 1027   LABSPEC 1.009 03/26/2017 1027   PHURINE 5.0 03/26/2017 1027   GLUCOSEU NEGATIVE 03/26/2017 1027   HGBUR NEGATIVE 03/26/2017 1027   BILIRUBINUR NEGATIVE 03/26/2017 1027   KETONESUR NEGATIVE  03/26/2017 1027   PROTEINUR NEGATIVE 03/26/2017 1027   NITRITE NEGATIVE 03/26/2017 1027   LEUKOCYTESUR NEGATIVE 03/26/2017 1027   Sepsis Labs: @LABRCNTIP (procalcitonin:4,lacticidven:4)  ) Recent Results (from the past 240 hour(s))  MRSA PCR Screening     Status: None   Collection Time: 04/14/17  1:30 AM  Result Value Ref Range Status   MRSA by PCR NEGATIVE NEGATIVE Final    Comment:        The GeneXpert MRSA Assay (FDA approved for NASAL specimens only), is one component of a comprehensive MRSA colonization surveillance program. It is not intended to diagnose MRSA infection nor to guide or monitor treatment for MRSA infections.          Radiology Studies: No results found.      Scheduled Meds: . feeding supplement (ENSURE ENLIVE)  15 mL Oral Daily  . magnesium oxide  200 mg Oral Daily  . metoprolol succinate  50 mg Oral BID  . pantoprazole (PROTONIX) IV  40 mg Intravenous Q12H   Continuous Infusions: . acetaminophen    . dextrose 5% lactated ringers    . diltiazem (CARDIZEM) infusion       LOS: 5 days    Time spent: 21mn   BNiel Hummer MD.  [facility] Pager [phone] If 7PM-7AM, please contact night-coverage [email or web address] Password [facility]9/07/2017, 3:18 PM

## 2017-04-19 NOTE — Care Management Note (Signed)
Case Management Note  Patient Details  Name: Emily Elliott MRN: [id number] Date of Birth: [birth year]  Subjective/Objective: Per attending-Partial code-continue to monitor progress, ivf. CM will monitor & follow for d/c plans as this may change.This is what CM has currently worked on with dtr-CM referral to offer HHC, & palliative care choice-[name]rep Bambi. [facility] following for dme.                    Action/Plan:d/c plan home.   Expected Discharge Date:                  Expected Discharge Plan:  Home w Home Health Services  In-House Referral:  Clinical Social Work  Discharge planning Services  CM Consult  Post Acute Care Choice:  Durable Medical Equipment (has hospital bed-unsure of where purchased) Choice offered to:  Adult Children  DME Arranged:    DME Agency:     HH Arranged:    HH Agency:     Status of Service:  In process, will continue to follow  If discussed at Long Length of Stay Meetings, dates discussed:    Additional Comments:  [name], Emily Lubbers, RN 04/19/2017, 3:10 PM

## 2017-04-19 NOTE — Progress Notes (Signed)
Patient's ABG results received.  Respiratory aware patient needs to be placed on BiPaP.  ICU aware of transfer.  Attempted report but ICU RN in process of report.  Night RN Leward QuanSamantha Seagraves is aware.

## 2017-04-19 NOTE — Progress Notes (Signed)
Palliative:  I attempted GOC conversation yesterday afternoon but family had left the hospital and we had a poor telephone connection and kept being disconnected. New plan is to meet with family 9/12 at 1300 for GOC discussion.   No charge   Yong ChannelAlicia Daaron Dimarco, NP Palliative Medicine Team Pager # [phone] (M-F 8a-5p) Team Phone # [phone] (Nights/Weekends)

## 2017-04-19 NOTE — Progress Notes (Signed)
   04/19/17 0900  Clinical Encounter Type  Visited With Patient and family together  Visit Type Follow-up  Referral From Chaplain  Consult/Referral To Chaplain  Spiritual Encounters  Spiritual Needs Prayer;Emotional  Stress Factors  Patient Stress Factors None identified   Meet the daughter who lives here.  [place] in [place] and 2 in [place].  Patient lost one child some time ago.  Daughter was attending to her needs and is caring for her.  Patient was not able to communicate much but knew I was there and nodded to a few questions.  Daughter indicated there is meeting at 1 to discuss next steps.  Provided emotional support for the family and prayed with the patient and daughter.  Will pass along for chaplain to follow up this afternoon the daughter indicated that would be helpful.  Chaplain Agustin CreeNewton Laurna Shetley

## 2017-04-19 NOTE — Consult Note (Signed)
Consultation Note Date: 04/19/2017   Patient Name: Emily Elliott  DOB: 08/20/1920  MRN: [id number]  Age / Sex: 83 y.o., female  PCP: [name], MD Referring Physician: Elmarie Shiley, MD  Reason for Consultation: Establishing goals of care  HPI/Patient Profile: 83 y.o. female  with past medical history of chronic atrial fibrillation, chronic diastolic heart failure (2 recent admission r/t CHF requiring thoracentesis and BiPAP), stage III chronic kidney disease and chronic venous stasis lower extremity edema, h/o colon cancer admitted on 04/13/2017 with bleeding at colostomy site and low hemoglobin. She received 1 unit PRBC and hemoglobin stable.   Clinical Assessment and Goals of Care: Ms. Winnett is well known to me from her last admission. She has had significant decline since last admission. She is at EOL but family struggling with acceptance. I met today with Ms. Roznowski daughters and we have long conversation regarding EOL and heart and renal failure causing EOL and this is why she is lethargic and not eating or drinking. Their main questions are regarding disposition with home health vs hospice vs outpatient palliative care. We discussed each option in depth to benefits and limitations and they have decided they would like to proceed with palliative care with home health. They are convinced that all she needs is some therapy and to be around family to improve. They do understand that the hospital has nothing more to offer to reverse Ms. Pepitone condition. I did not discuss but if we gave IVF she will likely need repeat thoracentesis with fluid overload s/t CHF and I am unsure that the benefits outweigh the risks.   I also spoke with the son, legal guardian (who is a physician), and he understands his mother is at EOL and he does believe that she should be DNR. I explained my conversation with his  sisters. He still will not go against his sisters for this decision but will call and speak with them about DNR as he does not feel his mother would desire FULL code. Will follow up with family. Difficult situation.   I also spoke with Dr. Tyrell Antonio.   Primary Decision Maker LEGAL GUARDIAN son Dr. Francene Castle    SUMMARY OF RECOMMENDATIONS   - Family to further discuss code status but maintain full code for now until they tell us otherwise  Code Status/Advance Care Planning:  Full code   Symptom Management:   Severe dysphagia: Not eating or drinking. Not a candidate for feeding tube d/t progressive disease towards EOL.   Palliative Prophylaxis:   Aspiration, bowel regimen, turn/postition, oral care  Additional Recommendations (Limitations, Scope, Preferences):  Full Scope Treatment  Psycho-social/Spiritual:   Desire for further Chaplaincy support:yes  Additional Recommendations: Caregiving  Support/Resources, Education on Hospice and Grief/Bereavement Support  Prognosis:   Likely days to weeks - continues to significantly decline daily.   Discharge Planning: To Be Determined. Family requesting home health with palliative services at this time.      Primary Diagnoses: Present on Admission: . Acute GI bleeding . Essential  hypertension . CKD (chronic kidney disease), stage III . Atrial fibrillation, chronic ([place]) . Acute blood loss anemia . Anemia   I have reviewed the medical record, interviewed the patient and family, and examined the patient. The following aspects are pertinent.  Past Medical History:  Diagnosis Date  . Acute on chronic diastolic (congestive) heart failure ([place])   . Anemia   . ARF (acute respiratory failure) ([place])   . Atrial fibrillation ([place])   . CKD (chronic kidney disease) stage 3, GFR 30-59 ml/min   . Cognitive communication deficit   . Deficiency of other specified B group vitamins   . Dysphagia   . Hyperlipemia   . Hypertension     . Muscle weakness (generalized)   . Other lack of coordination   . Respiratory failure with hypoxia and hypercapnia ([place])   . Unsteadiness on feet   . Vitamin D deficiency    Social History   Social History  . Marital status: Widowed    Spouse name: N/A  . Number of children: N/A  . Years of education: N/A   Social History Main Topics  . Smoking status: Never Smoker  . Smokeless tobacco: Never Used  . Alcohol use No  . Drug use: No  . Sexual activity: Not Asked   Other Topics Concern  . None   Social History Narrative  . None   Family History  Problem Relation Age of Onset  . Hypertension Other    Scheduled Meds: . diltiazem  120 mg Oral Daily  . feeding supplement (ENSURE ENLIVE)  15 mL Oral Daily  . magnesium oxide  200 mg Oral Daily  . metoprolol succinate  50 mg Oral BID   Continuous Infusions: PRN Meds:.acetaminophen **OR** acetaminophen, albuterol, ondansetron **OR** ondansetron (ZOFRAN) IV, RESOURCE THICKENUP CLEAR Allergies  Allergen Reactions  . Benadryl [Diphenhydramine] Rash   Review of Systems  Unable to perform ROS: Acuity of condition    Physical Exam  Constitutional: She appears lethargic. She appears cachectic. She has a sickly appearance.  Left facial droop  Cardiovascular: An irregularly irregular rhythm present. Tachycardia present.   Pulmonary/Chest: Effort normal. No accessory muscle usage. No tachypnea. No respiratory distress.  Abdominal: Normal appearance.  Neurological: She appears lethargic. She is disoriented.  Nursing note and vitals reviewed.   Vital Signs: BP (!) 142/76 (BP Location: Left Arm)   Pulse (!) 106   Temp 98.1 F (36.7 C) (Oral)   Resp (!) 22   Ht '5\' 3"'$  (1.6 m)   Wt 68.3 kg (150 lb 9.2 oz)   SpO2 100%   BMI 26.67 kg/m  Pain Assessment: No/denies pain   Pain Score: 0-No pain   SpO2: SpO2: 100 % O2 Device:SpO2: 100 % O2 Flow Rate: .O2 Flow Rate (L/min): 2 L/min  IO: Intake/output summary:   Intake/Output Summary (Last 24 hours) at 04/19/17 1359 Last data filed at 04/19/17 1328  Gross per 24 hour  Intake                0 ml  Output              700 ml  Net             -700 ml    LBM: Last BM Date: 04/17/17 Baseline Weight: Weight: 65.3 kg (144 lb) Most recent weight: Weight: 68.3 kg (150 lb 9.2 oz)     Palliative Assessment/Data: 20%    Time Total: 74mn  Greater than 50%  of this time  was spent counseling and coordinating care related to the above assessment and plan.  Signed by: Vinie Sill, NP Palliative Medicine Team Pager # [phone] (M-F 8a-5p) Team Phone # [phone] (Nights/Weekends)

## 2017-04-20 ENCOUNTER — Inpatient Hospital Stay (HOSPITAL_COMMUNITY): Payer: Medicare Other

## 2017-04-20 DIAGNOSIS — J90 Pleural effusion, not elsewhere classified: Secondary | ICD-10-CM

## 2017-04-20 DIAGNOSIS — N179 Acute kidney failure, unspecified: Secondary | ICD-10-CM

## 2017-04-20 DIAGNOSIS — J984 Other disorders of lung: Secondary | ICD-10-CM

## 2017-04-20 LAB — BASIC METABOLIC PANEL
Anion gap: 10 (ref 5–15)
BUN: 62 mg/dL — AB (ref 6–20)
CALCIUM: 8.8 mg/dL — AB (ref 8.9–10.3)
CHLORIDE: 107 mmol/L (ref 101–111)
CO2: 41 mmol/L — AB (ref 22–32)
CREATININE: 1.48 mg/dL — AB (ref 0.44–1.00)
GFR calc Af Amer: 34 mL/min — ABNORMAL LOW (ref >60)
GFR calc non Af Amer: 29 mL/min — ABNORMAL LOW (ref >60)
Glucose, Bld: 143 mg/dL — ABNORMAL HIGH (ref 65–99)
Potassium: 4.1 mmol/L (ref 3.5–5.1)
Sodium: 158 mmol/L — ABNORMAL HIGH (ref 135–145)

## 2017-04-20 LAB — BLOOD GAS, ARTERIAL
ACID-BASE EXCESS: 13 mmol/L — AB (ref 0.0–2.0)
BICARBONATE: 41.9 mmol/L — AB (ref 20.0–28.0)
DELIVERY SYSTEMS: POSITIVE
DRAWN BY: 11249
EXPIRATORY PAP: 5
FIO2: 40
INSPIRATORY PAP: 20
LHR: 14 {breaths}/min
Mode: POSITIVE
O2 Saturation: 97.3 %
Patient temperature: 97.5
pCO2 arterial: 82.6 mmHg (ref 32.0–48.0)
pH, Arterial: 7.323 — ABNORMAL LOW (ref 7.350–7.450)
pO2, Arterial: 97.8 mmHg (ref 83.0–108.0)

## 2017-04-20 LAB — CBC
HEMATOCRIT: 37.1 % (ref 36.0–46.0)
HEMOGLOBIN: 10.3 g/dL — AB (ref 12.0–15.0)
MCH: 27.7 pg (ref 26.0–34.0)
MCHC: 27.8 g/dL — AB (ref 30.0–36.0)
MCV: 99.7 fL (ref 78.0–100.0)
Platelets: 234 10*3/uL (ref 150–400)
RBC: 3.72 MIL/uL — ABNORMAL LOW (ref 3.87–5.11)
RDW: 15.9 % — AB (ref 11.5–15.5)
WBC: 10.7 10*3/uL — ABNORMAL HIGH (ref 4.0–10.5)

## 2017-04-20 LAB — MAGNESIUM: Magnesium: 2.5 mg/dL — ABNORMAL HIGH (ref 1.7–2.4)

## 2017-04-20 MED ORDER — NYSTATIN 100000 UNIT/ML MT SUSP
5.0000 mL | Freq: Four times a day (QID) | OROMUCOSAL | Status: DC
  Administered 2017-04-20 – 2017-04-21 (×6): 500000 [IU] via ORAL
  Filled 2017-04-20 (×5): qty 5

## 2017-04-20 MED ORDER — METOPROLOL TARTRATE 25 MG PO TABS
25.0000 mg | ORAL_TABLET | Freq: Two times a day (BID) | ORAL | Status: DC
  Administered 2017-04-20 – 2017-04-21 (×2): 25 mg via ORAL
  Filled 2017-04-20 (×2): qty 1

## 2017-04-20 MED ORDER — DILTIAZEM HCL 30 MG PO TABS
30.0000 mg | ORAL_TABLET | Freq: Three times a day (TID) | ORAL | Status: DC
  Administered 2017-04-20 – 2017-04-21 (×2): 30 mg via ORAL
  Filled 2017-04-20 (×2): qty 1

## 2017-04-20 MED ORDER — METOPROLOL TARTRATE 5 MG/5ML IV SOLN
5.0000 mg | Freq: Three times a day (TID) | INTRAVENOUS | Status: DC
  Administered 2017-04-20 (×2): 5 mg via INTRAVENOUS
  Filled 2017-04-20 (×2): qty 5

## 2017-04-20 MED ORDER — DILTIAZEM HCL-DEXTROSE 100-5 MG/100ML-% IV SOLN (PREMIX)
5.0000 mg/h | INTRAVENOUS | Status: DC
  Administered 2017-04-20 (×3): 12.5 mg/h via INTRAVENOUS
  Filled 2017-04-20 (×3): qty 100

## 2017-04-20 NOTE — Care Management Note (Signed)
Case Management Note  Patient Details  Name: Emily Elliott MRN: [id number] Date of Birth: [birth year]  Subjective/Objective:                  bipap  Action/Plan: Date:  April 20, 2017 Chart reviewed for concurrent status and case management needs. Will continue to follow patient progress. Discharge Planning: following for needs Expected discharge date: [id number] Emily Elliott, BSN, [place], [place]   [phone]  Expected Discharge Date:                  Expected Discharge Plan:  Home w Home Health Services  In-House Referral:  Clinical Social Work  Discharge planning Services  CM Consult  Post Acute Care Choice:  Durable Medical Equipment (has hospital bed-unsure of where purchased) Choice offered to:  Adult Children  DME Arranged:    DME Agency:     HH Arranged:    HH Agency:     Status of Service:  In process, will continue to follow  If discussed at Long Length of Stay Meetings, dates discussed:    Additional Comments:  Emily Elliott, Emily Lynn, RN 04/20/2017, 8:31 AM

## 2017-04-20 NOTE — Consult Note (Signed)
Name: Emily NettleMavis Warrick MRN: [id number] DOB: Aug 21, 1920    ADMISSION DATE:  04/13/2017 CONSULTATION DATE:  04/20/17  REFERRING MD :  Dr. Sunnie Nielsenegalado   CHIEF COMPLAINT:  Hypercarbic Respiratory Failure    HISTORY OF PRESENT ILLNESS:  83 y/o F, never smoker, retired Public house managerLPN / midwife, who presented 9/6 via EMS from SNF for evaluation of anemia.  She reportedly had lab work completed at the facility and her Hgb was 8.3.  Family requested evaluation of anemia.  PMH- colon cancer s/p resection with colostomy (2016), AF recently taken off coumadin (03/2017), CKD3, dysphagia (baseline on D1 diet with nectar thick liquids), debilitation / failure to thrive  The patient was recently admitted from 8/19-8/24 for complaints of SOB.  At that time she was found to have a UTI, decompensated CHF, suspected aspiration (known dysphagia) PNA and pleural effusions s/p thoracentesis (exudative by [facility] 8/7).  During that admission, she was seen by Palliative Care and made a DNR with comfort measures.  Her son [place] an [place] in [place] and after discharge he requested labs because the patient complained of increased fatigue.  Per report, her hemoglobin typically runs in the 12 range and was found to have a Hgb of 8.5 on presentation.  No reports of bleeding (previously on Coumadin for AF / taken off last admit per Dr. Jacinto HalimGanji).  FOBT from colostomy bag was positive.  She was admitted for further evaluation of anemia per [facility].  She was evaluated by GI and recommended no endoscopic interventions unless overt GI bleeding develops.  The patient was transfused PRBC.  Chart review indicates that the family has not been in agreement on plan of care (daughters want aggressive care and Son who is an MD agrees on hospice but does not want to go against his sisters). She was re-evaluated by SLP and dysphagia was felt to be worsening with increased aspiration risk with overall debilitation / decline.  Family requested to continue feedings despite risks.   She developed increased work of breathing 9/12-9/13 and was placed on BiPAP intermittently.    PCCM consulted for evaluation of hypercarbic respiratory failure, pleural effusions.   PAST MEDICAL HISTORY :   has a past medical history of Acute on chronic diastolic (congestive) heart failure (H[facility]); Anemia; ARF (acute respiratory failure) ([facility]); Atrial fibrillation ([facility]); CKD (chronic kidney disease) stage 3, GFR 30-59 ml/min; Cognitive communication deficit; Deficiency of other specified B group vitamins; Dysphagia; Hyperlipemia; Hypertension; Muscle weakness (generalized); Other lack of coordination; Respiratory failure with hypoxia and hypercapnia (H[facility]); Unsteadiness on feet; and Vitamin D deficiency.   has a past surgical history that includes Colostomy.  Prior to Admission medications   Medication Sig Start Date End Date Taking? Authorizing Provider  Ascorbic Acid (VITAMIN C) 1000 MG tablet Take 1,000 mg by mouth daily.   Yes [provider]  atorvastatin (LIPITOR) 10 MG tablet Take 10 mg by mouth at bedtime.    Yes [provider]  Cholecalciferol (VITAMIN D3) 5000 units CAPS Take 5,000 Units by mouth daily.   Yes [provider]  COD LIVER OIL/LOW VITAMIN A CAPS Take 1 capsule by mouth daily.   Yes [provider]  diltiazem (DILACOR XR) 120 MG 24 hr capsule Take 120 mg by mouth daily.   Yes [provider]  ferrous sulfate 324 (65 Fe) MG TBEC Take 1 tablet by mouth daily.   Yes [provider]  furosemide (LASIX) 40 MG tablet Take 1 tablet (40 mg total) by mouth daily. 03/31/17  Yes Leatha Gilding, MD  Magnesium 250 MG TABS Take 250 mg by mouth daily.   Yes [provider]  metoprolol succinate (TOPROL-XL) 50 MG 24 hr tablet Take 50 mg by mouth 2 (two) times daily. Take with or immediately following a meal.   Yes [provider]  Nutritional Supplements (NUTRITIONAL SUPPLEMENT PLUS) LIQD Take 15 mLs by mouth daily.    Yes [provider]    Allergies  Allergen Reactions  . Benadryl [Diphenhydramine] Rash    FAMILY HISTORY:  family history includes Hypertension in her other.  SOCIAL HISTORY:  reports that she has never smoked. She has never used smokeless tobacco. She reports that she does not drink alcohol or use drugs.  REVIEW OF SYSTEMS:  Unable to complete with patient.    SUBJECTIVE:   VITAL SIGNS: Temp:  [97.1 F (36.2 C)-98.6 F (37 C)] 97.5 F (36.4 C) (09/13 0800) Pulse Rate:  [83-132] 83 (09/13 1000) Resp:  [14-28] 23 (09/13 1000) BP: (131-178)/(71-97) 147/87 (09/13 1000) SpO2:  [93 %-100 %] 100 % (09/13 1000) Weight:  [143 lb 1.3 oz (64.9 kg)] 143 lb 1.3 oz (64.9 kg) (09/12 2010)  PHYSICAL EXAMINATION: General: frail elderly female in NAD, sleeping upon entering the room   HEENT: MM pink/moist, edentulous PSY: pleasant Neuro: awakens to voice, speech clear, MAE CV: s1s2 rrr, no m/r/g PULM: even/non-labored, lungs bilaterally diminished  UX:LKGM, non-tender, bsx4 active  Extremities: warm/dry, no edema  Skin: no rashes or lesions   Recent Labs Lab 04/17/17 0925 04/19/17 0614 04/20/17 0335  NA 151* 155* 158*  K 4.3 4.3 4.1  CL 100* 102 107  CO2 41* 44* 41*  BUN 50* 62* 62*  CREATININE 1.45* 1.60* 1.48*  GLUCOSE 125* 127* 143*     Recent Labs Lab 04/16/17 0522 04/19/17 0614 04/20/17 0335  HGB 10.3* 10.4* 10.3*  HCT 35.8* 37.7 37.1  WBC 9.8 7.9 10.7*  PLT 225 228 234    Dg Chest Port 1 View  Result Date: 04/20/2017 CLINICAL DATA:  Shortness of breath, known pleural effusions. Acute on chronic renal failure, acute on chronic CHF. EXAM: PORTABLE CHEST 1 VIEW COMPARISON:  Portable chest x-ray of April 16, 2017 FINDINGS: The lungs are mildly hypoinflated. Moderate-sized bilateral pleural effusions persist greatest on the right. The cardiac silhouette is enlarged. The pulmonary vascularity remains engorged but is more distinct today. There is  calcification in the wall of the aortic arch. There degenerative changes of the right shoulder. IMPRESSION: CHF with mild pulmonary interstitial edema and moderate bilateral pleural effusions. There has been interval improvement in the appearance of the pulmonary interstitium and in the volume of the pleural effusions since the April 16, 2017 study. Thoracic aortic atherosclerosis. Electronically Signed   By: David  [place] M.D.   On: 04/20/2017 09:39     SIGNIFICANT EVENTS  8/06-8/14  Admit for UTI  8/19-8/24  Admit with UTI, CHF, pleural effusions, aspiration PNA, hypercapnic respiratory failure 9/06  Admit with anemia   STUDIES:  ECHO 8/7  >> LVEF 65-70%, no WMA, RV mildly dilated, RA moderately dilated, TR mod regurgitation, pulmonic valve mild regurgitation, PA peak 61   ASSESSMENT / PLAN:  Discussion:  83 y/o F admitted for evaluation of anemia.  She was transfused 1 unit PRBC.  The patient has hx of CHF, bilateral pleural effusions, CKD, AF, chronic dysphagia with recent aspiration and hospitalization for UTI's.  She has had 3 admissions in the last two months and overall failure to  thrive.  She has chronic hypercarbia noted on ABG.  Suspect she acutely decompensated in the setting of anemia with GIB, volume overload with transfusion / effusions, restrictive disease with kyphosis & failure to thrive.   Acute on Chronic Hypercarbic Respiratory Failure - baseline pCO2 ~ 70-80 Bilateral Pleural Effusions - secondary to CHF / hypoventilation, anemia, PRBC Anemia in setting of GIB CHF  CKD III  Chronic Dysphagia  Recurrent Aspiration  Failure to Thrive   Plan: No role for repeat thoracentesis  Would recommend considering no further use of BiPAP if any evidence of discomfort for patient Aspiration precautions / SLP evaluation  Promote all therapies that add to comfort / relief of her suffering  No role for further ABG's  Would recommend no heroic measures in the event of her  decompensation > note limited code status.  Intermittent CXR Promote IS, good nutrition, PT efforts for overall wellness  Canary Brim, NP-C [facility] Pgr: [phone] or if no answer [phone] 04/20/2017, 11:21 AM

## 2017-04-20 NOTE — Consult Note (Signed)
[facility]  I have seen and examined the patient with nurse practitioner/resident and agree with the note above.  We formulated the plan together and I elicited the following history.    We were consulted today by the hospitalist service to evaluate Ms. Clermont for her chronic respiratory failure and bilateral pleural effusions.  She reports no dyspnea today.  She has dysphagia, heart failure and CKD.  She has received a thoracentesis in the past.  On exam Vitals:   04/20/17 0800 04/20/17 0802 04/20/17 1000 04/20/17 1200  BP: (!) 161/94 (!) 161/94 (!) 147/87 (!) 161/81  Pulse: 93 (!) 103 83 94  Resp: 20 18 (!) 23 (!) 22  Temp: (!) 97.5 F (36.4 C)   98 F (36.7 C)  TempSrc: Axillary   Oral  SpO2: 100% 100% 100% 100%  Weight:      Height:       Gen: frail female, resting comfortably in bed HENT: NCAT OP clear PULM diminished bases, some crackles bases CV: Irreg irreg, JVD to angle of jaw GI: belly soft, nontender, normal bowel sounds  CXR images reviewed: bilateral pleural effusion  Acute respiratory failure with hypoxemia Bilateral pleural effusions Acute pulmonary edema CHF CKD Chronic respiratory failure with hypercapnea Restrictive lung disease due to kyphosis, pleural effusions and edema Dysphagia  Discussion: She has chronic multi organ failure and has reached end stage; I explained to her daughter today that if you try to treat her kidney failure with fluids the fluids will just collect in the lungs and then you will end up doing another thoracentesis.  She voiced understanding.  I explained that there is nothing I can do to make her mother well again and I encouraged focusing on comfort.  I discussed this with her son Dr. Celso Sicklearl Ourada MD who agrees with limiting care but states that his sisters are hoping for something which is unrealistic.  PCCM will sign off  Heber CarolinaBrent Daniela Siebers, MD [place] [facility] Pager: [phone] Cell: [phone] After 3pm or if no response, call  [phone]

## 2017-04-20 NOTE — Consult Note (Addendum)
CARDIOLOGY CONSULT NOTE  Patient ID: Tonika Eden MRN: [id number] DOB/AGE: 81-Apr-1922 81 y.o.  Admit date: 04/13/2017 Referring Physician  Jorene Minors  Primary Physician:  Jackie Plum, MD Reason for Consultation  CHF  HPI: Isabel Ardila  is a 83 y.o. female  With chronic atrial fibrillation, chronic diastolic heart failure, stage III  chronic kidney disease and chronic venous stasis lower extremity edema.  Patient has had multiple hospital admissions in the recent past with a past 6 weeks with failure to thrive, recurrent congestive heart failure and shortness of breath and altered mental status. She is now again admitted with worsening mental status, acute on chronic diastolic heart failure, failure to thrive and worsening renal function and severe anemia. I was asked to see the patient for management of heart failure.  On the previous discharge on 03/31/2017, I had a long discussion with patient's daughters and had discussed comfort measures and she was made DO NOT RESUSCITATE. After this admission, one of the daughters, Okey Regal had issues with letting go and she was made full code and now she is partial code. I know the family well and have been taking care of the patient in the outpatient basis and has requested to evaluate the patient for medical management of heart failure and also end-of-life discussions.  Past Medical History:  Diagnosis Date  . Acute on chronic diastolic (congestive) heart failure (HCC)   . Anemia   . ARF (acute respiratory failure) (HCC)   . Atrial fibrillation (HCC)   . CKD (chronic kidney disease) stage 3, GFR 30-59 ml/min   . Cognitive communication deficit   . Deficiency of other specified B group vitamins   . Dysphagia   . Hyperlipemia   . Hypertension   . Muscle weakness (generalized)   . Other lack of coordination   . Respiratory failure with hypoxia and hypercapnia (HCC)   . Unsteadiness on feet   . Vitamin D deficiency      Past Surgical History:   Procedure Laterality Date  . COLOSTOMY       Family History  Problem Relation Age of Onset  . Hypertension Other      Social History: Social History   Social History  . Marital status: Widowed    Spouse name: N/A  . Number of children: N/A  . Years of education: N/A   Occupational History  . Not on file.   Social History Main Topics  . Smoking status: Never Smoker  . Smokeless tobacco: Never Used  . Alcohol use No  . Drug use: No  . Sexual activity: Not on file   Other Topics Concern  . Not on file   Social History Narrative  . No narrative on file     Prescriptions Prior to Admission  Medication Sig Dispense Refill Last Dose  . Ascorbic Acid (VITAMIN C) 1000 MG tablet Take 1,000 mg by mouth daily.   04/13/2017 at Unknown time  . atorvastatin (LIPITOR) 10 MG tablet Take 10 mg by mouth at bedtime.    04/12/2017 at Unknown time  . Cholecalciferol (VITAMIN D3) 5000 units CAPS Take 5,000 Units by mouth daily.   04/13/2017 at Unknown time  . COD LIVER OIL/LOW VITAMIN A CAPS Take 1 capsule by mouth daily.   04/13/2017 at Unknown time  . diltiazem (DILACOR XR) 120 MG 24 hr capsule Take 120 mg by mouth daily.   04/13/2017 at Unknown time  . ferrous sulfate 324 (65 Fe) MG TBEC Take 1 tablet by mouth daily.  04/13/2017 at Unknown time  . furosemide (LASIX) 40 MG tablet Take 1 tablet (40 mg total) by mouth daily. 30 tablet  04/13/2017 at Unknown time  . Magnesium 250 MG TABS Take 250 mg by mouth daily.   04/13/2017 at Unknown time  . metoprolol succinate (TOPROL-XL) 50 MG 24 hr tablet Take 50 mg by mouth 2 (two) times daily. Take with or immediately following a meal.   04/13/2017 at Unknown time  . Nutritional Supplements (NUTRITIONAL SUPPLEMENT PLUS) LIQD Take 15 mLs by mouth daily.   04/12/2017 at Unknown time     Review of Systems - patient is fatigued, complains of being tired, states that she is hungry, other systems negative. Unable to obtain complete history.    Physical Exam: Blood  pressure (!) 147/79, pulse 96, temperature 97.6 F (36.4 C), temperature source Oral, resp. rate (!) 25, height  (1.6 m), weight 64.9 kg (143 lb 1.3 oz), SpO2 100 %.   General appearance: alert, cooperative, appears stated age, fatigued and no distress Lungs: Bilateral basal coarse crackles present left worse on the right. Heart: S1 is variable, S2 is normal. 2/6 midsystolic murmur in the tricuspid area. JVD elevated to the angle of jaw, hepatojugular reflux present. Abdomen: soft, non-tender; bowel sounds normal; no masses,  no organomegaly and Obese Ileostomy bag left iliac fossa noted and appears healthy. Extremities: 2 plus edema +  Labs:  Lab Results  Component Value Date   WBC 10.7 (H) 04/20/2017   HGB 10.3 (L) 04/20/2017   HCT 37.1 04/20/2017   MCV 99.7 04/20/2017   PLT 234 04/20/2017    Recent Labs Lab 04/13/17 1906  04/20/17 0335  NA 143  < > 158*  K 3.8  < > 4.1  CL 96*  < > 107  CO2 37*  < > 41*  BUN 39*  < > 62*  CREATININE 1.17*  < > 1.48*  CALCIUM 8.5*  < > 8.8*  PROT 7.5  --   --   BILITOT 0.5  --   --   ALKPHOS 78  --   --   ALT 23  --   --   AST 31  --   --   GLUCOSE 131*  < > 143*  < > = values in this interval not displayed.  Recent Labs  03/13/17 1835 03/26/17 1100  BNP 761.6* 828.6*   Recent Labs  03/26/17 1100  TROPONINI 0.04*   Recent Labs  03/14/17 1010  TSH 2.077     Radiology: Dg Chest Port 1 View  Result Date: 04/20/2017 CLINICAL DATA:  Shortness of breath, known pleural effusions. Acute on chronic renal failure, acute on chronic CHF. EXAM: PORTABLE CHEST 1 VIEW COMPARISON:  Portable chest x-ray of April 16, 2017 FINDINGS: The lungs are mildly hypoinflated. Moderate-sized bilateral pleural effusions persist greatest on the right. The cardiac silhouette is enlarged. The pulmonary vascularity remains engorged but is more distinct today. There is calcification in the wall of the aortic arch. There degenerative changes of the  right shoulder. IMPRESSION: CHF with mild pulmonary interstitial edema and moderate bilateral pleural effusions. There has been interval improvement in the appearance of the pulmonary interstitium and in the volume of the pleural effusions since the April 16, 2017 study. Thoracic aortic atherosclerosis. Electronically Signed   By: David  [place] M.D.   On: 04/20/2017 09:39   Dg Swallowing Func-speech Pathology  Result Date: 04/20/2017 Objective Swallowing Evaluation: Type of Study: MBS-Modified Barium Swallow Study Patient Details  Name: Patricia NettleMavis Olarte MRN: [id number] Date of Birth: 1921/01/25 Today's Date: 04/20/2017 Time: SLP Start Time (ACUTE ONLY): 1233-SLP Stop Time (ACUTE ONLY): 1310 SLP Time Calculation (min) (ACUTE ONLY): 37 min Past Medical History: Past Medical History: Diagnosis Date . Acute on chronic diastolic (congestive) heart failure (HCC)  . Anemia  . ARF (acute respiratory failure) (HCC)  . Atrial fibrillation (HCC)  . CKD (chronic kidney disease) stage 3, GFR 30-59 ml/min  . Cognitive communication deficit  . Deficiency of other specified B group vitamins  . Dysphagia  . Hyperlipemia  . Hypertension  . Muscle weakness (generalized)  . Other lack of coordination  . Respiratory failure with hypoxia and hypercapnia (HCC)  . Unsteadiness on feet  . Vitamin D deficiency  Past Surgical History: Past Surgical History: Procedure Laterality Date . COLOSTOMY   HPI: [name] is a 35F with chronic diastolic heart failure, CKD3, colon cancer status post partial colectomy with colostomy, P A. fib (Coumadin stopped 2 weeks ago) who presented to the hospital from a nursing facility with a low hemoglobin.   MOST form was previously filled out indicating comfort measures however appears plans/care plan was changed.   During hospital course, pt had mental status change with concern for possible new neuro event.  Swallow evaluation was subsequently ordered.  Pt was seen by SLP during August admission and MBS  completed 03/15/17 with no aspiration with puree/thin diet. Per notes, pt was on a puree/nectar prior to admission.  8/20 showed right pleural effusion. Suspicion of neuro event noted and pt with increased dysphagia/dysarthria.  Pt was transferred to ICU due to respiratory deficits requiring Bipap and family determined plan for intervention.  Order for repeat swallow evaluation received and MBS indicated if family desires full measures.  CXR today showed improved pleural effusion.   Subjective: pt awake in chair Assessment / Plan / Recommendation CHL IP CLINICAL IMPRESSIONS 04/18/2017 Clinical Impression Oral suctioning/moisture provided prior to testing to provide best opportunity to success.  Pt presents with significant weakness resulting in decreased oral control, premature spillage into pharynx and oropharyngeal residuals.   Pt does intermittently sense pharyngeal residuals but conducting extra swallow is laborious and tiring for this patient.  Mild laryngeal penetration of liquid mixed with secretions observed with cues to cough/hock not fully effective.  Penetration occurred due to decreased timing of laryngeal closure.  SLP did NOT allow pt to aspirate due to her tenuous status - but risk is very high. SLP suctioned pt during testing to facilitate clearance of secretions/barium. As testing went on, pt herself appeared to be more tired and oral control was more impaired with oropharyngeal residuals worsening. Cues to dry swallow were followed x2/5 opportunities due to pt's gross weakness - and did decrease residuals but did not fully clear.  In addition, pt appeared with esophageal residuals -- full column-  ? Mixed with secretions - that she did not sense - increases aspiration risk.  Pt only observed with 7 boluses due to concern for fatigue.  As pt is grossly weak and can not fully clear residuals- her aspiration risk is high.  Her dysphagia prevents her from consuming adequate po with significant aspiration  risk of residuals. If pt is to consume po intake, it should be with known risks and for comfort.   Educated daughter, pt and RN to findings/concerns using video image to show daughter  SLP Visit Diagnosis Dysphagia, oropharyngeal phase (R13.12) Attention and concentration deficit following -- Frontal lobe and executive function deficit following --  Impact on safety and function Severe aspiration risk;Risk for inadequate nutrition/hydration   CHL IP TREATMENT RECOMMENDATION 04/17/2017 Treatment Recommendations Therapy as outlined in treatment plan below   Prognosis 04/20/2017 Prognosis for Safe Diet Advancement Guarded Barriers to Reach Goals Motivation;Severity of deficits Barriers/Prognosis Comment -- CHL IP DIET RECOMMENDATION 04/18/2017 SLP Diet Recommendations NPO- po for comfort if desired, ? Puree/nectar for comfort? Liquid Administration via -- Medication Administration -- Compensations -- Postural Changes --   CHL IP OTHER RECOMMENDATIONS 04/17/2017 Recommended Consults -- Oral Care Recommendations -- Other Recommendations Have oral suction available   CHL IP FOLLOW UP RECOMMENDATIONS 04/18/2017 Follow up Recommendations None   CHL IP FREQUENCY AND DURATION 04/17/2017 Speech Therapy Frequency (ACUTE ONLY) min 1 x/week Treatment Duration 1 week      CHL IP ORAL PHASE 04/20/2017 Oral Phase Impaired Oral - Pudding Teaspoon -- Oral - Pudding Cup -- Oral - Honey Teaspoon -- Oral - Honey Cup -- Oral - Nectar Teaspoon Reduced posterior propulsion;Weak lingual manipulation;Premature spillage;Delayed oral transit Oral - Nectar Cup Weak lingual manipulation;Reduced posterior propulsion;Lingual pumping;Premature spillage;Delayed oral transit Oral - Nectar Straw Weak lingual manipulation;Reduced posterior propulsion;Lingual pumping;Premature spillage;Delayed oral transit Oral - Thin Teaspoon Weak lingual manipulation;Lingual pumping;Reduced posterior propulsion;Premature spillage;Delayed oral transit Oral - Thin Cup Weak  lingual manipulation;Lingual pumping;Reduced posterior propulsion;Delayed oral transit;Premature spillage Oral - Thin Straw -- Oral - Puree Weak lingual manipulation;Reduced posterior propulsion;Lingual pumping;Delayed oral transit;Premature spillage Oral - Mech Soft -- Oral - Regular -- Oral - Multi-Consistency -- Oral - Pill -- Oral Phase - Comment --  CHL IP PHARYNGEAL PHASE 04/20/2017 Pharyngeal Phase Impaired Pharyngeal- Pudding Teaspoon -- Pharyngeal -- Pharyngeal- Pudding Cup -- Pharyngeal -- Pharyngeal- Honey Teaspoon -- Pharyngeal -- Pharyngeal- Honey Cup -- Pharyngeal -- Pharyngeal- Nectar Teaspoon Delayed swallow initiation-vallecula;Penetration/Aspiration before swallow;Pharyngeal residue - valleculae;Pharyngeal residue - pyriform Pharyngeal Material enters airway, remains ABOVE vocal cords and not ejected out Pharyngeal- Nectar Cup Delayed swallow initiation-vallecula;Penetration/Aspiration before swallow;Pharyngeal residue - valleculae;Pharyngeal residue - pyriform Pharyngeal Material enters airway, remains ABOVE vocal cords and not ejected out Pharyngeal- Nectar Straw Delayed swallow initiation-vallecula;Penetration/Aspiration before swallow;Pharyngeal residue - valleculae;Pharyngeal residue - pyriform Pharyngeal Material enters airway, remains ABOVE vocal cords and not ejected out Pharyngeal- Thin Teaspoon Delayed swallow initiation-vallecula;Penetration/Aspiration before swallow;Pharyngeal residue - valleculae;Pharyngeal residue - pyriform Pharyngeal Material enters airway, remains ABOVE vocal cords and not ejected out Pharyngeal- Thin Cup Delayed swallow initiation-pyriform sinuses;Penetration/Aspiration before swallow;Pharyngeal residue - pyriform;Pharyngeal residue - valleculae Pharyngeal Material enters airway, remains ABOVE vocal cords and not ejected out Pharyngeal- Thin Straw -- Pharyngeal -- Pharyngeal- Puree Delayed swallow initiation-vallecula;Reduced epiglottic inversion;Pharyngeal residue  - pyriform;Pharyngeal residue - valleculae Pharyngeal -- Pharyngeal- Mechanical Soft -- Pharyngeal -- Pharyngeal- Regular -- Pharyngeal -- Pharyngeal- Multi-consistency -- Pharyngeal -- Pharyngeal- Pill -- Pharyngeal -- Pharyngeal Comment --  CHL IP CERVICAL ESOPHAGEAL PHASE 04/20/2017 Cervical Esophageal Phase Impaired Pudding Teaspoon -- Pudding Cup -- Honey Teaspoon -- Honey Cup -- Nectar Teaspoon Prominent cricopharyngeal segment Nectar Cup Prominent cricopharyngeal segment Nectar Straw -- Thin Teaspoon Prominent cricopharyngeal segment Thin Cup Prominent cricopharyngeal segment Thin Straw Prominent cricopharyngeal segment Puree Prominent cricopharyngeal segment Mechanical Soft -- Regular -- Multi-consistency -- Pill -- Cervical Esophageal Comment upon esophageal sweep, pt appeared with retention of barium throughout esophagus without awareness No flowsheet data found. Donavan Burnet, MS [facility] SLP [phone]               Scheduled Meds: . feeding supplement (ENSURE ENLIVE)  15 mL Oral Daily  . magnesium oxide  200 mg Oral  Daily  . metoprolol tartrate  5 mg Intravenous Q8H  . nystatin  5 mL Oral QID  . pantoprazole (PROTONIX) IV  40 mg Intravenous Q12H   Continuous Infusions: . dextrose 5% lactated ringers 20 mL/hr at 04/20/17 1331  . diltiazem (CARDIZEM) infusion 12.5 mg/hr (04/20/17 1523)   PRN Meds:.acetaminophen **OR** acetaminophen, albuterol, haloperidol lactate, ondansetron **OR** ondansetron (ZOFRAN) IV, RESOURCE THICKENUP CLEAR  CARDIAC STUDIES:  EKG 04/20/2017: Atrial fibrillation with rapid ventricular response at the rate of 102 bpm, leftward axis, poor R progression, LVH with repolarization abnormality.   Echo: 03/14/2017: Hyperdynamic LV, EF 65-70%, unable to evaluate diastolic function, mildly dilated left atrium, right ventricle mildly dilated with low normal RV systolic function, moderate TR, moderate to severe pulmonary hypertension.  ASSESSMENT AND PLAN:  1. Failure to thrive  in an adult with multiple medical comorbidities who has reached end-of-life.  Recommendation: I had a long telephone conference call with Okey Regal and Dr. Serita Grit; Gottens &  [name] who is present at  Bedside. After long discussion, especially with Okey Regal who was in denial, she is in agreement that her mom is reaching end of life. They're all in agreement that she needs to be hospice with home hospice arranged and can be discharged. Please resume complete diet as requested by patient, discontinue all further physical therapy and swallow evaluation, discharge home on comfort medications with the help of palliative/comfort care. Discussed with primary team.  DNR orders placed. 30 minutes of family conference and additional 30 minutes in evaluation of medical records and face to face encounter.  Yates Decamp, MD 04/20/2017, 6:44 PM [facility] Pager: [phone] Office: [phone] If no answer Cell [phone]

## 2017-04-20 NOTE — Progress Notes (Addendum)
Objective Swallowing Evaluation: Type of Study: MBS-Modified Barium Swallow Study  Patient Details  Name: Emily Elliott MRN: [id number] Date of Birth: 1921-04-13  Today's Date: 04/20/2017 Time: SLP Start Time (ACUTE ONLY): 1233-SLP Stop Time (ACUTE ONLY): 1310 SLP Time Calculation (min) (ACUTE ONLY): 37 min  Past Medical History:  Past Medical History:  Diagnosis Date  . Acute on chronic diastolic (congestive) heart failure (HCC)   . Anemia   . ARF (acute respiratory failure) (HCC)   . Atrial fibrillation (HCC)   . CKD (chronic kidney disease) stage 3, GFR 30-59 ml/min   . Cognitive communication deficit   . Deficiency of other specified B group vitamins   . Dysphagia   . Hyperlipemia   . Hypertension   . Muscle weakness (generalized)   . Other lack of coordination   . Respiratory failure with hypoxia and hypercapnia (HCC)   . Unsteadiness on feet   . Vitamin D deficiency    Past Surgical History:  Past Surgical History:  Procedure Laterality Date  . COLOSTOMY     HPI: Ms Emily Elliott is a 40F with chronic diastolic heart failure, CKD3, colon cancer status post partial colectomy with colostomy, P A. fib (Coumadin stopped 2 weeks ago) who presented to the hospital from a nursing facility with a low hemoglobin.   MOST form was previously filled out indicating comfort measures however appears plans/care plan was changed.   During hospital course, pt had mental status change with concern for possible new neuro event.  Swallow evaluation was subsequently ordered.  Pt was seen by SLP during August admission and MBS completed 03/15/17 with no aspiration with puree/thin diet. Per notes, pt was on a puree/nectar prior to admission.  8/20 showed right pleural effusion. Suspicion of neuro event noted and pt with increased dysphagia/dysarthria.  Pt was transferred to ICU due to respiratory deficits requiring Bipap and family determined plan for intervention.  Order for repeat swallow evaluation received  and MBS indicated if family desires full measures.  CXR today showed improved pleural effusion.    Subjective: pt awake in chair   Assessment / Plan / Recommendation  CHL IP CLINICAL IMPRESSIONS 04/18/2017  Clinical Impression Oral suctioning/moisture provided prior to testing to provide best opportunity to success.  Pt presents with significant weakness resulting in decreased oral control, premature spillage into pharynx and oropharyngeal residuals.   Pt does intermittently sense pharyngeal residuals but conducting extra swallow is laborious and tiring for this patient.  Mild laryngeal penetration of liquids and liquids mixed with secretions observed with cues to cough/hock not fully effective.  SLP did NOT allow pt to aspirate due to her tenuous status - but risk is very high. SLP suctioned pt during testing to facilitate clearance of secretions/barium.    As testing went on, pt herself appeared to be more tired and oral control was more impaired with oropharyngeal residuals worsening.  Cues to dry swallow were followed x2/5 opportunities due to pt's gross weakness - and did decrease residuals but did not fully clear.    In addition, pt appeared with esophageal residuals - ? Mixed with secretions - that she did not sense.  This will increase her aspiration risk.   Pt only observed with 7 boluses due to concern for fatigue.  As pt is grossly weak and can not fully clear residuals- her aspiration risk is high.  Her dysphagia prevents her from consuming adequate po with significant aspiration risk of residuals. If pt is to consume po intake, it should  be with known risks and for comfort.   Educated daughter, pt and RN to findings/concerns using video image to show daughter.  SLP Visit Diagnosis Dysphagia, oropharyngeal phase (R13.12)  Attention and concentration deficit following --  Frontal lobe and executive function deficit following --  Impact on safety and function Severe aspiration risk;Risk  for inadequate nutrition/hydration      CHL IP TREATMENT RECOMMENDATION 04/17/2017  Treatment Recommendations Therapy as outlined in treatment plan below     Prognosis 04/20/2017  Prognosis for Safe Diet Advancement Guarded  Barriers to Reach Goals Motivation;Severity of deficits  Barriers/Prognosis Comment --    CHL IP DIET RECOMMENDATION 04/18/2017  SLP Diet Recommendations NPO vs puree/nectar for comfort/enjoyment with accepted risks - tsps water after oral care for comfort, oral hygeine  Liquid Administration via --  Medication Administration --  Compensations --  Postural Changes --      CHL IP OTHER RECOMMENDATIONS 04/17/2017  Recommended Consults --  Oral Care Recommendations --  Other Recommendations Have oral suction available      CHL IP FOLLOW UP RECOMMENDATIONS 04/18/2017  Follow up Recommendations None      CHL IP FREQUENCY AND DURATION 04/17/2017  Speech Therapy Frequency (ACUTE ONLY) min 1 x/week  Treatment Duration 1 week           CHL IP ORAL PHASE 04/20/2017  Oral Phase Impaired  Oral - Pudding Teaspoon --  Oral - Pudding Cup --  Oral - Honey Teaspoon --  Oral - Honey Cup --  Oral - Nectar Teaspoon Reduced posterior propulsion;Weak lingual manipulation;Premature spillage;Delayed oral transit  Oral - Nectar Cup Weak lingual manipulation;Reduced posterior propulsion;Lingual pumping;Premature spillage;Delayed oral transit  Oral - Nectar Straw Weak lingual manipulation;Reduced posterior propulsion;Lingual pumping;Premature spillage;Delayed oral transit  Oral - Thin Teaspoon Weak lingual manipulation;Lingual pumping;Reduced posterior propulsion;Premature spillage;Delayed oral transit  Oral - Thin Cup Weak lingual manipulation;Lingual pumping;Reduced posterior propulsion;Delayed oral transit;Premature spillage  Oral - Thin Straw --  Oral - Puree Weak lingual manipulation;Reduced posterior propulsion;Lingual pumping;Delayed oral transit;Premature spillage  Oral -  Mech Soft --  Oral - Regular --  Oral - Multi-Consistency --  Oral - Pill --  Oral Phase - Comment --    CHL IP PHARYNGEAL PHASE 04/20/2017  Pharyngeal Phase Impaired  Pharyngeal- Pudding Teaspoon --  Pharyngeal --  Pharyngeal- Pudding Cup --  Pharyngeal --  Pharyngeal- Honey Teaspoon --  Pharyngeal --  Pharyngeal- Honey Cup --  Pharyngeal --  Pharyngeal- Nectar Teaspoon Delayed swallow initiation-vallecula;Penetration/Aspiration before swallow;Pharyngeal residue - valleculae;Pharyngeal residue - pyriform  Pharyngeal Material enters airway, remains ABOVE vocal cords and not ejected out  Pharyngeal- Nectar Cup Delayed swallow initiation-vallecula;Penetration/Aspiration before swallow;Pharyngeal residue - valleculae;Pharyngeal residue - pyriform  Pharyngeal Material enters airway, remains ABOVE vocal cords and not ejected out  Pharyngeal- Nectar Straw Delayed swallow initiation-vallecula;Penetration/Aspiration before swallow;Pharyngeal residue - valleculae;Pharyngeal residue - pyriform  Pharyngeal Material enters airway, remains ABOVE vocal cords and not ejected out  Pharyngeal- Thin Teaspoon Delayed swallow initiation-vallecula;Penetration/Aspiration before swallow;Pharyngeal residue - valleculae;Pharyngeal residue - pyriform  Pharyngeal Material enters airway, remains ABOVE vocal cords and not ejected out  Pharyngeal- Thin Cup Delayed swallow initiation-pyriform sinuses;Penetration/Aspiration before swallow;Pharyngeal residue - pyriform;Pharyngeal residue - valleculae  Pharyngeal Material enters airway, remains ABOVE vocal cords and not ejected out  Pharyngeal- Thin Straw --  Pharyngeal --  Pharyngeal- Puree Delayed swallow initiation-vallecula;Reduced epiglottic inversion;Pharyngeal residue - pyriform;Pharyngeal residue - valleculae  Pharyngeal --  Pharyngeal- Mechanical Soft --  Pharyngeal --  Pharyngeal- Regular --  Pharyngeal --  Pharyngeal- Multi-consistency --  Pharyngeal --   Pharyngeal- Pill --  Pharyngeal --  Pharyngeal Comment --     CHL IP CERVICAL ESOPHAGEAL PHASE 04/20/2017  Cervical Esophageal Phase Impaired  Pudding Teaspoon --  Pudding Cup --  Honey Teaspoon --  Honey Cup --  Nectar Teaspoon Prominent cricopharyngeal segment  Nectar Cup Prominent cricopharyngeal segment  Nectar Straw --  Thin Teaspoon Prominent cricopharyngeal segment  Thin Cup Prominent cricopharyngeal segment  Thin Straw Prominent cricopharyngeal segment  Puree Prominent cricopharyngeal segment  Mechanical Soft --  Regular --  Multi-consistency --  Pill --  Cervical Esophageal Comment upon esophageal sweep, pt appeared with retention of barium throughout esophagus without awareness    No flowsheet data found.  Donavan Burnet, MS [facility] SLP [phone]

## 2017-04-20 NOTE — Progress Notes (Signed)
PROGRESS NOTE    Emily Elliott  JOI:[id number] DOB: 12-18-1920 DOA: 04/13/2017 PCP: [name], MD  Brief Narrative:Emily Elliott is a 78F with chronic diastolic heart failure, CKD3, colon cancer status post partial colectomy with colostomy, P A. fib (Coumadin stopped 2 weeks ago) who presented to the hospital from a nursing facility with a low hemoglobin. She was just discharged [name] after hospitalization for CHF/Resp failure, was felt to have ongoing failure to thrive and during that admission, palliative care was consulted, she was made DNR and MOST Form suggested no further hospital admissions, now all this has been reversed and brought back to hospital for evaluation of anemia Coumadin was discontinued about 2 weeks ago. Gradual drop in Hb from 9.5/10 range last admit to 8 and 7.6 now, spoke to son who is an MD in [place] who insisted on GI workup upon admission   Assessment & Plan:    Failure to thrive: -With declining functional status, worsening CHF, hypernatremia, CKD3-4, severe dysphagia, advanced age, severe malnutrition and very poor Po intake -after SLP eval, dysphagia felt to be much worse and recommended NPO/comfort feeds -Family met with palliative 9-12;  and daughters change Code status to Full code. I had family meeting, with childrens. They have decide to proceed with partial Code status; no intubation, no CPR. Yes; to medications, defibrillation, Oxygen,.see how patient respond with IV fluids.  -Continue with IV fluids, low rate.   Acute Encephalopathy;  Multifactorial, related to hypercapnia. hypernatremia, renal failure.  ABG showed hypercapnia respiratory Failure.  Patient was placed on BIPAP. Her mental status has improved on BIPAP. Will do BIPAP on and off today.  Suspect this will be recurrent event.   Hypernatremia -chronic, due to poor free water intake -discussed with family, will start gentle IV fluids. Continue to  monitor closely  respiratory status.  -Sodium level worse, continue with IV fluids.   Acute hypercapnic hypoxic Respiratory Failure;  Related to Heart failure, hypoventilation.  Improved with BIPAP.  BIPAP on and off today.  Check chest x ray to follow pleural effusion.    Severe Dysphagia -worsening now -could have had a new CVA after stopping coumadin few weeks ago -recommended comfort feeds and home with Hospice.  -will repeat swallow evaluation, family aware of risk for aspiration.   Anemia/Heme positive ostomy output -Anemia panel suggests mild iron deficiency -No active bleeding, suspect some intermittent oozing around stoma site, no longer on coumadin -s/p 1 unit PRBC, Hb improved and stable -son in [place] who is an MD and reportedly one of the decision makers for pt who was adamant abt having GI workup initially, GI consulted based on family demand, agree with plan for conservative management, due to risk of complications -Hb climbing with diuresis which suggests that low Hb on admission could have been due in part to hemodilution -hb stable.   H/o Colon CA -s/p hemicolectomy in [place] 2-3years ago and colostomy   Afib RVR; -Coumadin stopped by [name] few weeks ago -Change  diltiazem and metoprolol IV  -run of VT; check mg level, resume IV metoprolol.   Acute on Chronic diastolic CHF -CXR with Fluid overload, lasix on hold due to hypernatremia and renal failure.    Acute CKD 3 -worsening renal function, related to dehydration. Poor oral intake.  -cr decreased to 1.4.   Dysphagia  -on Dysphagia 1 diet with nectar thick liquids at SNF, resumed same -will ask SLP to re-assess  Debility, advanced age, ongoing failure to thrive, CHF,  recurrent hospitalizations -declining functional and cognitive status -s/p DNR and MOST form filled last admit with plan for no Hospitalizations, all this was reversed and admitted, now see discussions above  Epigastric pain;  Continue with  IV protonix in  case of gastritis.  IV tylenol for pain.   Oral candidiasis;  Nystatin oral.   DVT prophylaxis: SCDs Code Status: partial Code.  Family Communication: daughter at bedside, and  d/w Dr.Carl Crotwell. Explain that patient is high risk for readmission, recurrent hypercapnic respiratory failure, aspiration PNA due to aspiration risk. Will get Chest x ray to follow effusion, thoracentesis as needed. I have contacted [name], and he will see patient in consultation. [name] is patient 's primary cardiologist.   Disposition Plan: SNF when stable  Consultants:  GI  Subjective: She is more alert this morning. Does not wants to have   Objective: Vitals:   04/20/17 0317 04/20/17 0319 04/20/17 0400 04/20/17 0802  BP:   (!) 178/92 (!) 161/94  Pulse:  89 100 (!) 103  Resp:  _0 Temp: (!) 97.1 F (36.2 C)     TempSrc: Axillary     SpO2:  100% 100% 100%  Weight:      Height:        Intake/Output Summary (Last 24 hours) at 04/20/17 0846 Last data filed at 04/20/17 0500  Gross per 24 hour  Intake           140.66 ml  Output              300 ml  Net          -159.34 ml   Filed Weights   04/13/17 2259 04/14/17 0027 04/19/17 2010  Weight: 65.3 kg (144 lb) 68.3 kg (150 lb 9.2 oz) 64.9 kg (143 lb 1.3 oz)    Examination:  Gen: more alert.  HEENT: No JVD, MMM dry.   Lungs: decreases breath sounds, bilateral fine crackles.  CVS: S 1, S 2 IRR Abd: Soft, NT, ostomy with bag and stool in bag.  Extremities: no edema Skin: no new rashes, dry skin.    Data Reviewed:   CBC:  Recent Labs Lab 04/13/17 1906  04/14/17 5009 04/15/17 0601 04/16/17 0522 04/19/17 0614 04/20/17 0335  WBC 10.0  < > 7.6 7.8 9.8 7.9 10.7*  NEUTROABS 8.2*  --   --   --   --   --   --   HGB 8.5*  < > 7.9* 9.6* 10.3* 10.4* 10.3*  HCT 29.2*  < > 27.4* 32.9* 35.8* 37.7 37.1  MCV 96.4  < > 95.5 95.9 98.4 102.2* 99.7  PLT 237  < > 239 218 225 228 234  < > = values in this interval not displayed. Basic  Metabolic Panel:  Recent Labs Lab 04/15/17 0601 04/16/17 0522 04/17/17 0925 04/19/17 0614 04/20/17 0335  NA 146* 149* 151* 155* 158*  K 3.9 4.2 4.3 4.3 4.1  CL 100* 100* 100* 102 107  CO2 39* 40* 41* 44* 41*  GLUCOSE 118* 136* 125* 127* 143*  BUN 37* 41* 50* 62* 62*  CREATININE 1.14* 1.25* 1.45* 1.60* 1.48*  CALCIUM 8.4* 8.5* 8.8* 8.8* 8.8*   GFR: Estimated Creatinine Clearance: 21.5 mL/min (A) (by C-G formula based on SCr of 1.48 mg/dL (H)). Liver Function Tests:  Recent Labs Lab 04/13/17 1906  AST 31  ALT 23  ALKPHOS 78  BILITOT 0.5  PROT 7.5  ALBUMIN 3.1*   No results for input(s):  LIPASE, AMYLASE in the last 168 hours. No results for input(s): AMMONIA in the last 168 hours. Coagulation Profile:  Recent Labs Lab 04/13/17 1906  INR 1.06   Cardiac Enzymes: No results for input(s): CKTOTAL, CKMB, CKMBINDEX, TROPONINI in the last 168 hours. BNP (last 3 results) No results for input(s): PROBNP in the last 8760 hours. HbA1C: No results for input(s): HGBA1C in the last 72 hours. CBG:  Recent Labs Lab 04/16/17 1405 04/19/17 1050  GLUCAP 152* 130*   Lipid Profile: No results for input(s): CHOL, HDL, LDLCALC, TRIG, CHOLHDL, LDLDIRECT in the last 72 hours. Thyroid Function Tests: No results for input(s): TSH, T4TOTAL, FREET4, T3FREE, THYROIDAB in the last 72 hours. Anemia Panel: No results for input(s): VITAMINB12, FOLATE, FERRITIN, TIBC, IRON, RETICCTPCT in the last 72 hours. Urine analysis:    Component Value Date/Time   COLORURINE YELLOW 03/26/2017 1027   APPEARANCEUR CLEAR 03/26/2017 1027   LABSPEC 1.009 03/26/2017 1027   PHURINE 5.0 03/26/2017 1027   GLUCOSEU NEGATIVE 03/26/2017 1027   HGBUR NEGATIVE 03/26/2017 1027   BILIRUBINUR NEGATIVE 03/26/2017 1027   KETONESUR NEGATIVE 03/26/2017 1027   PROTEINUR NEGATIVE 03/26/2017 1027   NITRITE NEGATIVE 03/26/2017 1027   LEUKOCYTESUR NEGATIVE 03/26/2017 1027   Sepsis  Labs: _0 (procalcitonin:4,lacticidven:4)  ) Recent Results (from the past 240 hour(s))  MRSA PCR Screening     Status: None   Collection Time: 04/14/17  1:30 AM  Result Value Ref Range Status   MRSA by PCR NEGATIVE NEGATIVE Final    Comment:        The GeneXpert MRSA Assay (FDA approved for NASAL specimens only), is one component of a comprehensive MRSA colonization surveillance program. It is not intended to diagnose MRSA infection nor to guide or monitor treatment for MRSA infections.          Radiology Studies: No results found.      Scheduled Meds: . feeding supplement (ENSURE ENLIVE)  15 mL Oral Daily  . magnesium oxide  200 mg Oral Daily  . metoprolol succinate  50 mg Oral BID  . pantoprazole (PROTONIX) IV  40 mg Intravenous Q12H   Continuous Infusions: . acetaminophen    . dextrose 5% lactated ringers    . diltiazem (CARDIZEM) infusion 12.5 mg/hr (04/20/17 0732)     LOS: 6 days    Time spent: 78mn   BNiel Hummer MD.  [facility] Pager [phone] If 7PM-7AM, please contact night-coverage [email or web address] Password [email or web address]9/13/2018, 8:46 AM

## 2017-04-20 NOTE — Progress Notes (Signed)
Daily Progress Note   Patient Name: Emily Elliott       Date: 04/20/2017 DOB: [birth year]  Age: 83 y.o. MRN#: [id number] Attending Physician: Emily Cory, MD Primary Care Physician: Emily Plum, MD Admit Date: 04/13/2017  Reason for Consultation/Follow-up: Establishing goals of care  Subjective: Emily Elliott is on BiPAP when we visit and I removed so she could speak. She refused to have it back on and we respected her wishes. No family at bedside.   Length of Stay: 6  Current Medications: Scheduled Meds:  . feeding supplement (ENSURE ENLIVE)  15 mL Oral Daily  . magnesium oxide  200 mg Oral Daily  . metoprolol tartrate  5 mg Intravenous Q8H  . nystatin  5 mL Oral QID  . pantoprazole (PROTONIX) IV  40 mg Intravenous Q12H    Continuous Infusions: . dextrose 5% lactated ringers 20 mL/hr at 04/20/17 1331  . diltiazem (CARDIZEM) infusion 12.5 mg/hr (04/20/17 1523)    PRN Meds: acetaminophen **OR** acetaminophen, albuterol, haloperidol lactate, ondansetron **OR** ondansetron (ZOFRAN) IV, RESOURCE THICKENUP CLEAR  Physical Exam         Constitutional: She appears lethargic. She appears cachectic. She has a sickly appearance.  Left facial droop  Cardiovascular: An irregularly irregular rhythm present. Tachycardia present.   Pulmonary/Chest: Effort normal. No accessory muscle usage. No tachypnea. No respiratory distress.  Abdominal: Normal appearance.  Neurological: She appears lethargic. She is disoriented.  Nursing note and vitals reviewed.   Vital Signs: BP (!) 161/86   Pulse 94   Temp 98 F (36.7 C) (Oral)   Resp 19   Ht  (1.6 m)   Wt 64.9 kg (143 lb 1.3 oz)   SpO2 100%   BMI 25.35 kg/m  SpO2: SpO2: 100 % O2 Device: O2 Device: Bi-PAP O2 Flow Rate: O2  Flow Rate (L/min): 5 L/min  Intake/output summary:  Intake/Output Summary (Last 24 hours) at 04/20/17 1606 Last data filed at 04/20/17 1200  Gross per 24 hour  Intake           140.66 ml  Output              400 ml  Net          -259.34 ml   LBM: Last BM Date: 04/20/17 Baseline Weight: Weight: 65.3 kg (144 lb)  Most recent weight: Weight: 64.9 kg (143 lb 1.3 oz)       Palliative Assessment/Data: 20%     Patient Active Problem List   Diagnosis Date Noted  . Acute renal failure superimposed on stage 3 chronic kidney disease (HCC)   . Failure to thrive in adult   . Anemia 04/14/2017  . Acute GI bleeding 04/13/2017  . Acute blood loss anemia 04/13/2017  . Acute on chronic diastolic congestive heart failure (HCC)   . Goals of care, counseling/discussion   . Palliative care encounter   . Respiratory failure (HCC) 03/26/2017  . Acute CHF (congestive heart failure) (HCC) 03/14/2017  . Coagulopathy (HCC) 03/14/2017  . Atrial fibrillation, chronic (HCC) 03/13/2017  . Essential hypertension 03/13/2017  . Hypernatremia 03/13/2017  . AKI (acute kidney injury) (HCC) 03/13/2017  . Acute encephalopathy 03/13/2017  . UTI (urinary tract infection) 03/13/2017  . History of colon cancer 03/13/2017  . Pleural effusion, right 03/13/2017  . Dysphagia 03/13/2017  . CKD (chronic kidney disease), stage III 03/13/2017  . Acute respiratory failure with hypoxia and hypercapnia (HCC)   . Foot pain, bilateral 05/10/2016    Palliative Care Assessment & Plan   HPI: 83 y.o. female  with past medical history of chronic atrial fibrillation, chronic diastolic heart failure (2 recent admission r/t CHF requiring thoracentesis and BiPAP), stage III chronic kidney disease and chronic venous stasis lower extremity edema, h/o colon cancer admitted on 04/13/2017 with bleeding at colostomy site and low hemoglobin. She received 1 unit PRBC and hemoglobin stable. Declining during hospital stay. Started on IVF for  dehydration and renal failure as well as cardizem infusion. Unfortunately required transition to ICU and BiPAP.   Assessment: No family at bedside. Emily Elliott is confused and does not understand her situation. She requests that we call her son "he is a doctor and he'll know what to do." She tells us that she does NOT want BiPAP back even if she dies without it "if I die I die."  I spoke with both daughters. Emily Elliott and Emily Regalarol, separately. Neither had much to say and was very short. Would not engage in conversation regarding mother's status or discussion regarding if we should continue BiPAP. Emily Elliott says she will make that decision when if comes. Emily RegalCarol says they are just "waiting to see what happens." I was unable to reach son/HCPOA Emily Elliott.   Extremely difficult situation.   Recommendations/Plan:  Respiratory failure: Family to continue BiPAP for now  Severe dysphagia: Family wishes to continue to diet with hopes she will eat more and get stronger  Goals of Care and Additional Recommendations:  Limitations on Scope of Treatment: Full Scope Treatment  Code Status:  Limited code - did not discuss again   Prognosis:   Very poor. May not survive hospitalization.   Discharge Planning:  To Be Determined  Care plan was discussed with Dr. Sunnie Nielsenegalado.   Thank you for allowing the Palliative Medicine Team to assist in the care of this patient.   Total Time 25 min Prolonged Time Billed  no       Greater than 50%  of this time was spent counseling and coordinating care related to the above assessment and plan.  Yong ChannelAlicia Lake Cinquemani, NP Palliative Medicine Team Pager # [phone] (M-F 8a-5p) Team Phone # [phone] (Nights/Weekends)

## 2017-04-20 NOTE — Progress Notes (Signed)
Nutrition Brief Note  Patient identified with low Braden Score.   Per palliative care note, pt approaching EOL. SLP has been recommending comfort feeds/NPO. Pt not a candidate for nutrition support.  Pt has been ordered Ensure supplements, but patient has been lethargic and pt is at risk of aspiration. Not given. PO intakes: 0%.  SLP to perform MBS today.  Wt Readings from Last 15 Encounters:  04/19/17 143 lb 1.3 oz (64.9 kg)  03/29/17 144 lb 1.6 oz (65.4 kg)  03/21/17 156 lb 12 oz (71.1 kg)  05/10/16 149 lb (67.6 kg)  01/08/16 145 lb (65.8 kg)    If nutrition needs arise, please consult.  Emily FrancoLindsey Maylin [place], MS, RD, LDN Pager: [phone] After Hours Pager: [phone]

## 2017-04-20 NOTE — Progress Notes (Signed)
Attempted to place Bipap on patient and she at first agreed, then when I placed mask on face pt. Stated that was not going to wear that at all.  RN, notified.

## 2017-04-20 NOTE — Progress Notes (Addendum)
MBS completed with daughter present.  Oral suctioning/moisture provided prior to testing to provide best opportunity to success.  Pt presents with significant weakness resulting in decreased oral control, premature spillage into pharynx and oropharyngeal residuals.   Pt does intermittently sense pharyngeal residuals but conducting extra swallow is laborious and tiring for this patient.  Mild laryngeal penetration of liquids and liquids mixed with secretions observed with cues to cough/hock not fully effective.  SLP did NOT allow pt to aspirate due to her tenuous status - but risk is very high. SLP suctioned pt during testing to facilitate clearance of secretions/barium.    As testing went on, pt herself appeared to be more tired and oral control was more impaired with oropharyngeal residuals worsening.  Cues to dry swallow were followed x2/5 opportunities due to pt's gross weakness - and did decrease residuals but did not fully clear.    In addition, pt appeared with esophageal residuals - ? Mixed with secretions - that she did not sense.    Pt only observed with 7 boluses due to concern for fatigue.  As pt is grossly weak and can not fully clear residuals- her aspiration risk is high.  Her dysphagia prevents her from consuming adequate po with significant aspiration risk of residuals. If pt is to consume po intake, it should be with known risks and for comfort.   Educated daughter, pt and RN to findings/concerns using video image to show daughter.    Emily Burnetamara Gladstone Rosas, MS [facility] SLP [phone]   Recommend pt be allowed tsps of water after oral care for comfort, decrease of disuse muscle atrophy and to help with hygeine.

## 2017-04-20 NOTE — Progress Notes (Signed)
Order received for swallow evaluation, given h/o dysphagia and concerns for aspiration - recommend proceed with instrumental evaluation to help elucidate dysphagia.  Spoke to Md, Teacher, musicN and Xray = plan for approximately noon today.  Donavan Burnetamara Josiane Labine, MS [facility] SLP [phone]

## 2017-04-21 MED ORDER — PANTOPRAZOLE SODIUM 40 MG PO TBEC: 40.0000 mg | DELAYED_RELEASE_TABLET | Freq: Every day | ORAL | 1 refills | Status: AC

## 2017-04-21 MED ORDER — RESOURCE THICKENUP CLEAR PO POWD: 1.0000 g | ORAL | 0 refills | Status: AC | PRN

## 2017-04-21 MED ORDER — MORPHINE SULFATE 10 MG/5ML PO SOLN: 2.5000 mg | ORAL | 0 refills | Status: AC | PRN

## 2017-04-21 MED ORDER — MORPHINE SULFATE 10 MG/5ML PO SOLN
2.5000 mg | ORAL | Status: DC | PRN
Start: 2017-04-21 — End: 2017-04-21

## 2017-04-21 MED ORDER — NYSTATIN 100000 UNIT/ML MT SUSP: 5.0000 mL | Freq: Four times a day (QID) | OROMUCOSAL | 0 refills | Status: AC

## 2017-04-21 NOTE — Progress Notes (Signed)
CSW consulted due to transportation needs. RNCM is assisting with dc planning at this time.  CSW signing off.  Cori Razor LCSW [phone]
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
## 2017-04-21 NOTE — Progress Notes (Signed)
Pt. Remains on [place] throughout shift without distress.  Refused Bipap for this shift.

## 2017-04-21 NOTE — Consult Note (Signed)
TC from [place] making referral for hospice care in home. Met with daughter at bedside. Equipment has been ordered from [facility]:  Oxygen, suction machine, 5N1 zone mattress, BSC, shower bench, OBT. Pt owns wheelchair and hospital bed. Equipment to be delivered between 4-8pm today.   [name] to call when equipment is delivered and so that hospice can contact ambulance for pick up.  Pt has DNR and it is in packet with ambulance--  Pt daughter and pt in agreement to hospice care and going [facility] [place]. Spoke to Lino Lakes to update of events and plan.  Webb Silversmith [phone]

## 2017-04-21 NOTE — Consult Note (Signed)
WOC Nurse ostomy consult note Stoma type/location: LLQ Colostomy.  Daughter at bedside and concerned about new lesion to stoma.  Linear abrasion noted to stoma.  Stoma telescopes, extending 3 cm at this time.  Pouch is cut too large and peristomal skin is exposed.  Replaced pouching system with new one, cut to fit and added barrier ring to skin.  Linear abrasion noted to peristomal skin at 4 o'clock, consistent with medical adhesive related skin injury (MARSI).  Used piece of barrier strip to protect this area.  Stomal assessment/size:  1 3/8", daughter has been cutting to 2"  Demonstrated measuring and appropriate size to cut and rationale.   Peristomal assessment: Linear abrasion 4 o'clock.  Protected with barrier ring, stoma powder to abrasion on stoma, although intact and resolving Treatment options for stomal/peristomal skin: barrier ring, stoma powder.  Given both to take home.  Output None in pouch Ostomy pouching: 2pc. 2 3/4" pouch with barrier ring and stoma powder  Education provided: Measuring, stomal abrasion care Enrolled patient in [employer] DC program: No Will not follow at this time.  Please re-consult if needed.  Maple Hudson RN BSN CWON Pager [phone]

## 2017-04-21 NOTE — Progress Notes (Signed)
Pt transferred home via EMS with hospice following.
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
## 2017-04-21 NOTE — Progress Notes (Signed)
Dr Mercer Pod, Patients son, just called and said he is the one to make the decision for the Code Status for his Mom. He said to fax the Code paper and he would sign it for the ambulance and for home. This RN confirmed that he wanted a DNR for the ambulance and for home. Dr Sunnie Nielsen called and gold form filled out and placed on chart for D/C.

## 2017-04-21 NOTE — Progress Notes (Signed)
Note order to discontinue slp, pt full comfort now and to dc home with hospice.   Donavan Burnet, MS [facility] SLP [phone]

## 2017-04-21 NOTE — Discharge Summary (Addendum)
Physician Discharge Summary  Emily Elliott ZOX:[id number] DOB: 12-20-1920 DOA: 04/13/2017  PCP: Emily Plum, MD  Admit date: 04/13/2017 Discharge date: 04/21/2017  Admitted From: Home  Disposition:  Home with Hospice.   Recommendations for Outpatient Follow-up:  1. Patient will be discharge home with Hospice.    Home Health: Discharge Home with hospice.   Discharge Condition: Guarded.  CODE STATUS: DNR Diet recommendation:  Dysphagia   Brief/Interim Summary: Brief Narrative:Emily Elliott an 83F with chronic diastolic heart failure, CKD3, colon cancer status post partial colectomy with colostomy, P A. fib (Coumadin stopped 2 weeks ago) who presented to the hospital from a nursing facility with a low hemoglobin. She was just discharged from [facility] 8/24 after hospitalization for CHF/Resp failure, was felt to have ongoing failure to thrive and during that admission,palliative care was consulted, she was made DNR and MOST Form suggested no further hospital admissions, now all this has been reversed and brought back to hospital for evaluation of anemia Coumadin was discontinued about 2 weeks ago. Gradual drop in Hb from 9.5/10 range last admit to 8 and 7.6 now, family requested GI evaluation.   Assessment & Plan:    Failure to thrive: -With declining functional status, worsening CHF, hypernatremia, CKD3-4, severe dysphagia, 83 years of age, severe malnutrition and very poor Po intake -after SLP eval, dysphagia felt to be much worse and recommended NPO/comfort feeds -patient with poor oral intake and severe aspiration risk.  -Family discussed with Dr Emily Elliott, plan is for patient to go home with Hospice.   Acute Encephalopathy;  Multifactorial, related to hypercapnia. hypernatremia, renal failure.  ABG showed hypercapnia respiratory Failure.  Patient was placed on BIPAP. Her mental status improved some  on BIPAP. Suspect this will be recurrent event.   Ostomy, with small  laceration. No significant bleeding notice. Will have wound care ostomy evaluate ostomy.   Hypernatremia -chronic, due to poor free water intake -Discussed with family, Will try gentle IV fluids. Due to concern for  accumulation of pleural effusion and HF fluid was subsequently stopped.  -Sodium level continue to increases. Limitation in care due to Heart failure, unable to provide IV fluids.   Acute hypercapnic hypoxic Respiratory Failure;  Related to Heart failure, hypoventilation.  Improved with BIPAP.  Off BIPAP. Patient refuse BIPAP at night.  Persistent pleural effusion. Evaluated by CCM, no further intervention. . Patient on 2 L oxygen.   Severe Dysphagia -could have had a new CVA, after stopping coumadin few weeks ago -recommended comfort feeds and home with Hospice.  -patient swallow evaluation was repeated. She has severe dysphagia. She is at very high risk for aspiration, recurrent infections.   Anemia/Heme positive ostomy output -Anemia panel suggests mild iron deficiency -No active bleeding, suspect some intermittent oozing around stoma site, no longer on coumadin -s/p 1 unit PRBC, Hb improved and stable.  -conservative management. GI recommended to hold on doing endoscopy unless over GI bleed.  -Hb climbing with diuresis which suggests that low Hb on admission could have been due in part to hemodilution -hb stable.   H/o Colon CA -s/p hemicolectomy in [place] 2-3years ago and colostomy   Afib RVR; -Coumadin stopped by [name] few weeks ago -back on oral medications. She was on Cardizem Gtt and IV metoprolol.   Acute on Chronic diastolic CHF -CXR with Fluid overload, lasix on hold due to hypernatremia and renal failure.  -Dr Emily Elliott consulted. Limitation on care of patient due to hypernatremia.   Acute CKD 3 -worsening renal function, related to  dehydration. Poor oral intake.  -cr decreased to 1.4.    Debility, 83 years of age, ongoing failure to thrive, CHF,  recurrent hospitalizations -declining functional and cognitive status. Patient with chronic multiorgan failure and has reached end stage. She was evaluated by CCM who recommend comfort care. Patient was also evaluated by her long time cardiologist Dr Emily Elliott who was recommending comfort care. Dr Emily Elliott had family meeting, they all agree with hospice and comfort care. Patient will be discharge home with hospice.   Epigastric pain;  Protonix at discharge.   Oral candidiasis;  Nystatin oral.   Discussed with Son, he was ok with me signing the DNR form for transfer.  Discussed with Daughter who was at bedside this morning who  wanted to keep partial code in the hospital/   Discharge Diagnoses:    Failure to thrive in adult with multiples co-morbidities who has reached end of life.    Acute on chronic diastolic HF.   Acute on chromic hypercapnic , hypoxic Respiratory failure.    Hypernatremia.   Acute GI bleeding   Atrial fibrillation, chronic    Essential hypertension   Acute on CKD (chronic kidney disease), stage III   Acute blood loss anemia   Acute renal failure superimposed on stage 3 chronic kidney disease [facility])    Discharge Instructions  Discharge Instructions    Diet - low sodium heart healthy    Complete by:  As directed    Increase activity slowly    Complete by:  As directed      Allergies as of 04/21/2017      Reactions   Benadryl [diphenhydramine] Rash      Medication List    STOP taking these medications   atorvastatin 10 MG tablet Commonly known as:  LIPITOR   COD LIVER OIL/LOW VITAMIN A Caps   furosemide 40 MG tablet Commonly known as:  LASIX   vitamin C 1000 MG tablet   Vitamin D3 5000 units Caps     TAKE these medications   diltiazem 120 MG 24 hr capsule Commonly known as:  DILACOR XR Take 120 mg by mouth daily.   ferrous sulfate 324 (65 Fe) MG Tbec Take 1 tablet by mouth daily.   Magnesium 250 MG Tabs Take 250 mg by mouth daily.    metoprolol succinate 50 MG 24 hr tablet Commonly known as:  TOPROL-XL Take 50 mg by mouth 2 (two) times daily. Take with or immediately following a meal.   morphine 10 MG/5ML solution Take 1.3 mLs (2.6 mg total) by mouth every 3 (three) hours as needed for severe pain (or SOB).   NUTRITIONAL SUPPLEMENT PLUS Liqd Take 15 mLs by mouth daily.   nystatin 100000 UNIT/ML suspension Commonly known as:  MYCOSTATIN Take 5 mLs (500,000 Units total) by mouth 4 (four) times daily.   pantoprazole 40 MG tablet Commonly known as:  PROTONIX Take 1 tablet (40 mg total) by mouth daily.   RESOURCE THICKENUP CLEAR Powd Take 1 g by mouth as needed.            Discharge Care Instructions        Start     Ordered   04/21/17 0000  nystatin (MYCOSTATIN) 100000 UNIT/ML suspension  4 times daily     04/21/17 0835   04/21/17 0000  Maltodextrin-Xanthan Gum (RESOURCE THICKENUP CLEAR) POWD  As needed     04/21/17 0835   04/21/17 0000  Increase activity slowly     04/21/17 0835   04/21/17  0000  Diet - low sodium heart healthy     04/21/17 0835   04/21/17 0000  pantoprazole (PROTONIX) 40 MG tablet  Daily     04/21/17 0854   04/21/17 0000  morphine 10 MG/5ML solution  Every  3 hours PRN     04/21/17 0855      Allergies  Allergen Reactions  . Benadryl [Diphenhydramine] Rash    Consultations:  CCM  GI  Palliative  Cardiology   Procedures/Studies: Dg Chest Port 1 View  Result Date: 04/20/2017 CLINICAL DATA:  Shortness of breath, known pleural effusions. Acute on chronic renal failure, acute on chronic CHF. EXAM: PORTABLE CHEST 1 VIEW COMPARISON:  Portable chest x-ray of April 16, 2017 FINDINGS: The lungs are mildly hypoinflated. Moderate-sized bilateral pleural effusions persist greatest on the right. The cardiac silhouette is enlarged. The pulmonary vascularity remains engorged but is more distinct today. There is calcification in the wall of the aortic arch. There degenerative  changes of the right shoulder. IMPRESSION: CHF with mild pulmonary interstitial edema and moderate bilateral pleural effusions. There has been interval improvement in the appearance of the pulmonary interstitium and in the volume of the pleural effusions since the April 16, 2017 study. Thoracic aortic atherosclerosis. Electronically Signed   By: David  [place] M.D.   On: 04/20/2017 09:39   Dg Chest Port 1 View  Result Date: 04/16/2017 CLINICAL DATA:  Cough EXAM: PORTABLE CHEST 1 VIEW COMPARISON:  04/13/2017 FINDINGS: Moderate right pleural effusion and right lower lobe atelectasis unchanged. Palate moderate left pleural effusion and left lower lobe atelectasis unchanged Pulmonary edema unchanged from the prior study. IMPRESSION: Congestive heart failure with edema and effusions unchanged. Electronically Signed   By: Marlan Palau M.D.   On: 04/16/2017 13:18   Dg Chest Portable 1 View  Result Date: 04/13/2017 CLINICAL DATA:  Anemia.  Shortness of breath. EXAM: PORTABLE CHEST 1 VIEW COMPARISON:  03/27/2017 FINDINGS: Moderate to large right pleural effusion with passive atelectasis causing hazy density thickening of the minor fissure. Moderate left pleural effusion causing retrocardiac opacity. It is difficult to compare volumes to the prior exam because of differences in positioning, but the right pleural effusion could be larger than previous. Heart size within normal limits. IMPRESSION: 1. Large right and moderate left pleural effusions with associated passive atelectasis. The right pleural effusion may have enlarged in the interim, although this is not certain due to differences in the degree of reclining between images. Electronically Signed   By: Gaylyn Rong M.D.   On: 04/13/2017 19:10   Dg Chest Port 1 View  Result Date: 03/27/2017 CLINICAL DATA:  Chest pain after RIGHT thoracentesis EXAM: PORTABLE CHEST 1 VIEW COMPARISON:  Chest radiograph 03/26/2017 FINDINGS: Reduction in volume of the  RIGHT pleural effusion following thoracentesis. No clear pneumothorax identified in the RIGHT or LEFT hemithorax. Small bilateral pleural effusions remain. IMPRESSION: 1. No appreciable pneumothorax following thoracentesis 2. Reduction in volume of RIGHT pleural effusion. 3. Persistent bilateral small effusions Electronically Signed   By: Genevive Bi M.D.   On: 03/27/2017 17:21   Dg Chest Port 1 View  Result Date: 03/26/2017 CLINICAL DATA:  83 year old female with a history of trouble breathing EXAM: PORTABLE CHEST 1 VIEW COMPARISON:  03/17/2017, 03/14/2017 FINDINGS: Worsening right sided opacity obscuring the right heart border and the right hemidiaphragm. Thickening of the fissure with pleuroparenchymal thickening along the periphery of the right lung. New airspace opacity in the right suprahilar region. Retrocardiac opacity with obscuration of the left hemidiaphragm.  Interlobular septal thickening Similar appearance of cardiomegaly. IMPRESSION: Worsening asymmetric pleural effusions, with enlarging right-sided pleural effusion and associated atelectasis/consolidation. Likely persisting small left-sided pleural effusion/atelectasis. Evidence of interstitial edema. Persisting cardiomegaly. Electronically Signed   By: Gilmer Mor D.O.   On: 03/26/2017 11:47   Dg Swallowing Func-speech Pathology  Result Date: 04/20/2017 Objective Swallowing Evaluation: Type of Study: MBS-Modified Barium Swallow Study Patient Details Name: Channell Quattrone MRN: [id number] Date of Birth: Mar 24, 1921 Today's Date: 04/20/2017 Time: SLP Start Time (ACUTE ONLY): 1233-SLP Stop Time (ACUTE ONLY): 1310 SLP Time Calculation (min) (ACUTE ONLY): 37 min Past Medical History: Past Medical History: Diagnosis Date . Acute on chronic diastolic (congestive) heart failure (HCC)  . Anemia  . ARF (acute respiratory failure) (HCC)  . Atrial fibrillation (HCC)  . CKD (chronic kidney disease) stage 3, GFR 30-59 ml/min  . Cognitive communication  deficit  . Deficiency of other specified B group vitamins  . Dysphagia  . Hyperlipemia  . Hypertension  . Muscle weakness (generalized)  . Other lack of coordination  . Respiratory failure with hypoxia and hypercapnia (HCC)  . Unsteadiness on feet  . Vitamin D deficiency  Past Surgical History: Past Surgical History: Procedure Laterality Date . COLOSTOMY   HPI: Ms Veno is a 79F with chronic diastolic heart failure, CKD3, colon cancer status post partial colectomy with colostomy, P A. fib (Coumadin stopped 2 weeks ago) who presented to the hospital from a nursing facility with a low hemoglobin.   MOST form was previously filled out indicating comfort measures however appears plans/care plan was changed.   During hospital course, pt had mental status change with concern for possible new neuro event.  Swallow evaluation was subsequently ordered.  Pt was seen by SLP during August admission and MBS completed 03/15/17 with no aspiration with puree/thin diet. Per notes, pt was on a puree/nectar prior to admission.  8/20 showed right pleural effusion. Suspicion of neuro event noted and pt with increased dysphagia/dysarthria.  Pt was transferred to ICU due to respiratory deficits requiring Bipap and family determined plan for intervention.  Order for repeat swallow evaluation received and MBS indicated if family desires full measures.  CXR today showed improved pleural effusion.   Subjective: pt awake in chair Assessment / Plan / Recommendation CHL IP CLINICAL IMPRESSIONS 04/18/2017 Clinical Impression Oral suctioning/moisture provided prior to testing to provide best opportunity to success.  Pt presents with significant weakness resulting in decreased oral control, premature spillage into pharynx and oropharyngeal residuals.   Pt does intermittently sense pharyngeal residuals but conducting extra swallow is laborious and tiring for this patient.  Mild laryngeal penetration of liquid mixed with secretions observed with cues  to cough/hock not fully effective.  Penetration occurred due to decreased timing of laryngeal closure.  SLP did NOT allow pt to aspirate due to her tenuous status - but risk is very high. SLP suctioned pt during testing to facilitate clearance of secretions/barium. As testing went on, pt herself appeared to be more tired and oral control was more impaired with oropharyngeal residuals worsening. Cues to dry swallow were followed x2/5 opportunities due to pt's gross weakness - and did decrease residuals but did not fully clear.  In addition, pt appeared with esophageal residuals -- full column-  ? Mixed with secretions - that she did not sense - increases aspiration risk.  Pt only observed with 7 boluses due to concern for fatigue.  As pt is grossly weak and can not fully clear residuals- her aspiration risk is  high.  Her dysphagia prevents her from consuming adequate po with significant aspiration risk of residuals. If pt is to consume po intake, it should be with known risks and for comfort.   Educated daughter, pt and RN to findings/concerns using video image to show daughter  SLP Visit Diagnosis Dysphagia, oropharyngeal phase (R13.12) Attention and concentration deficit following -- Frontal lobe and executive function deficit following -- Impact on safety and function Severe aspiration risk;Risk for inadequate nutrition/hydration   CHL IP TREATMENT RECOMMENDATION 04/17/2017 Treatment Recommendations Therapy as outlined in treatment plan below   Prognosis 04/20/2017 Prognosis for Safe Diet Advancement Guarded Barriers to Reach Goals Motivation;Severity of deficits Barriers/Prognosis Comment -- CHL IP DIET RECOMMENDATION 04/18/2017 SLP Diet Recommendations NPO- po for comfort if desired, ? Puree/nectar for comfort? Liquid Administration via -- Medication Administration -- Compensations -- Postural Changes --   CHL IP OTHER RECOMMENDATIONS 04/17/2017 Recommended Consults -- Oral Care Recommendations -- Other  Recommendations Have oral suction available   CHL IP FOLLOW UP RECOMMENDATIONS 04/18/2017 Follow up Recommendations None   CHL IP FREQUENCY AND DURATION 04/17/2017 Speech Therapy Frequency (ACUTE ONLY) min 1 x/week Treatment Duration 1 week      CHL IP ORAL PHASE 04/20/2017 Oral Phase Impaired Oral - Pudding Teaspoon -- Oral - Pudding Cup -- Oral - Honey Teaspoon -- Oral - Honey Cup -- Oral - Nectar Teaspoon Reduced posterior propulsion;Weak lingual manipulation;Premature spillage;Delayed oral transit Oral - Nectar Cup Weak lingual manipulation;Reduced posterior propulsion;Lingual pumping;Premature spillage;Delayed oral transit Oral - Nectar Straw Weak lingual manipulation;Reduced posterior propulsion;Lingual pumping;Premature spillage;Delayed oral transit Oral - Thin Teaspoon Weak lingual manipulation;Lingual pumping;Reduced posterior propulsion;Premature spillage;Delayed oral transit Oral - Thin Cup Weak lingual manipulation;Lingual pumping;Reduced posterior propulsion;Delayed oral transit;Premature spillage Oral - Thin Straw -- Oral - Puree Weak lingual manipulation;Reduced posterior propulsion;Lingual pumping;Delayed oral transit;Premature spillage Oral - Mech Soft -- Oral - Regular -- Oral - Multi-Consistency -- Oral - Pill -- Oral Phase - Comment --  CHL IP PHARYNGEAL PHASE 04/20/2017 Pharyngeal Phase Impaired Pharyngeal- Pudding Teaspoon -- Pharyngeal -- Pharyngeal- Pudding Cup -- Pharyngeal -- Pharyngeal- Honey Teaspoon -- Pharyngeal -- Pharyngeal- Honey Cup -- Pharyngeal -- Pharyngeal- Nectar Teaspoon Delayed swallow initiation-vallecula;Penetration/Aspiration before swallow;Pharyngeal residue - valleculae;Pharyngeal residue - pyriform Pharyngeal Material enters airway, remains ABOVE vocal cords and not ejected out Pharyngeal- Nectar Cup Delayed swallow initiation-vallecula;Penetration/Aspiration before swallow;Pharyngeal residue - valleculae;Pharyngeal residue - pyriform Pharyngeal Material enters airway,  remains ABOVE vocal cords and not ejected out Pharyngeal- Nectar Straw Delayed swallow initiation-vallecula;Penetration/Aspiration before swallow;Pharyngeal residue - valleculae;Pharyngeal residue - pyriform Pharyngeal Material enters airway, remains ABOVE vocal cords and not ejected out Pharyngeal- Thin Teaspoon Delayed swallow initiation-vallecula;Penetration/Aspiration before swallow;Pharyngeal residue - valleculae;Pharyngeal residue - pyriform Pharyngeal Material enters airway, remains ABOVE vocal cords and not ejected out Pharyngeal- Thin Cup Delayed swallow initiation-pyriform sinuses;Penetration/Aspiration before swallow;Pharyngeal residue - pyriform;Pharyngeal residue - valleculae Pharyngeal Material enters airway, remains ABOVE vocal cords and not ejected out Pharyngeal- Thin Straw -- Pharyngeal -- Pharyngeal- Puree Delayed swallow initiation-vallecula;Reduced epiglottic inversion;Pharyngeal residue - pyriform;Pharyngeal residue - valleculae Pharyngeal -- Pharyngeal- Mechanical Soft -- Pharyngeal -- Pharyngeal- Regular -- Pharyngeal -- Pharyngeal- Multi-consistency -- Pharyngeal -- Pharyngeal- Pill -- Pharyngeal -- Pharyngeal Comment --  CHL IP CERVICAL ESOPHAGEAL PHASE 04/20/2017 Cervical Esophageal Phase Impaired Pudding Teaspoon -- Pudding Cup -- Honey Teaspoon -- Honey Cup -- Nectar Teaspoon Prominent cricopharyngeal segment Nectar Cup Prominent cricopharyngeal segment Nectar Straw -- Thin Teaspoon Prominent cricopharyngeal segment Thin Cup Prominent cricopharyngeal segment Thin Straw Prominent cricopharyngeal segment Puree Prominent cricopharyngeal segment  Mechanical Soft -- Regular -- Multi-consistency -- Pill -- Cervical Esophageal Comment upon esophageal sweep, pt appeared with retention of barium throughout esophagus without awareness No flowsheet data found. Donavan Burnet, MS [facility] SLP [phone]              US Thoracentesis Asp Pleural Space W/img Guide  Result Date: 03/27/2017 INDICATION:  Patient with right pleural effusion. Request is made for diagnostic and therapeutic thoracentesis. EXAM: ULTRASOUND GUIDED DIAGNOSTIC AND THERAPEUTIC RIGHT THORACENTESIS MEDICATIONS: 10 mL 1% lidocaine COMPLICATIONS: None immediate. PROCEDURE: An ultrasound guided thoracentesis was thoroughly discussed with the patient and questions answered. The benefits, risks, alternatives and complications were also discussed. The patient understands and wishes to proceed with the procedure. Written consent was obtained. Ultrasound was performed to localize and mark an adequate pocket of fluid in the right chest. The area was then prepped and draped in the normal sterile fashion. 1% Lidocaine was used for local anesthesia. Under ultrasound guidance a Safe-T-Centesis catheter was introduced. Thoracentesis was performed. The catheter was removed and a dressing applied. FINDINGS: A total of approximately 520 mL of blood-tinged fluid was removed. Samples were sent to the laboratory as requested by the clinical team. IMPRESSION: Successful ultrasound guided diagnostic and therapeutic thoracentesis yielding 520 mL of pleural fluid. Read by:  Loyce Dys PA-C Electronically Signed   By: Richarda Overlie M.D.   On: 03/27/2017 17:12      Subjective: Patient chronically  ill appearing. Very weak, she said I am ok.   Discharge Exam: Vitals:   04/21/17 0800 04/21/17 1200  BP: (!) 147/73   Pulse: 93   Resp: (!) 24   Temp: 98.3 F (36.8 C) 98.1 F (36.7 C)  SpO2: 100%    Vitals:   04/21/17 0006 04/21/17 0400 04/21/17 0800 04/21/17 1200  BP:  123/75 (!) 147/73   Pulse:  98 93   Resp:  (!) 22 (!) 24   Temp: 98.1 F (36.7 C) 97.9 F (36.6 C) 98.3 F (36.8 C) 98.1 F (36.7 C)  TempSrc: Oral Oral Oral Oral  SpO2:  100% 100%   Weight:  67.3 kg (148 lb 5.9 oz)    Height:        General: Chronically ill appearing Cardiovascular: S 1 S 2 RRR Respiratory: no increase work of breathing, decreased breath sounds.   Abdominal: soft, NT, colostomy telescope, small linear abrasion.  Extremities:  no cyanosis    The results of significant diagnostics from this hospitalization (including imaging, microbiology, ancillary and laboratory) are listed below for reference.     Microbiology: Recent Results (from the past 240 hour(s))  MRSA PCR Screening     Status: None   Collection Time: 04/14/17  1:30 AM  Result Value Ref Range Status   MRSA by PCR NEGATIVE NEGATIVE Final    Comment:        The GeneXpert MRSA Assay (FDA approved for NASAL specimens only), is one component of a comprehensive MRSA colonization surveillance program. It is not intended to diagnose MRSA infection nor to guide or monitor treatment for MRSA infections.      Labs: BNP (last 3 results)  Recent Labs  03/13/17 1835 03/26/17 1100  BNP 761.6* 828.6*   Basic Metabolic Panel:  Recent Labs Lab 04/15/17 0601 04/16/17 0522 04/17/17 0925 04/19/17 0614 04/20/17 0335  NA 146* 149* 151* 155* 158*  K 3.9 4.2 4.3 4.3 4.1  CL 100* 100* 100* 102 107  CO2 39* 40* 41* 44* 41*  GLUCOSE 118*  136* 125* 127* 143*  BUN 37* 41* 50* 62* 62*  CREATININE 1.14* 1.25* 1.45* 1.60* 1.48*  CALCIUM 8.4* 8.5* 8.8* 8.8* 8.8*  MG  --   --   --   --  2.5*   Liver Function Tests: No results for input(s): AST, ALT, ALKPHOS, BILITOT, PROT, ALBUMIN in the last 168 hours. No results for input(s): LIPASE, AMYLASE in the last 168 hours. No results for input(s): AMMONIA in the last 168 hours. CBC:  Recent Labs Lab 04/15/17 0601 04/16/17 0522 04/19/17 0614 04/20/17 0335  WBC 7.8 9.8 7.9 10.7*  HGB 9.6* 10.3* 10.4* 10.3*  HCT 32.9* 35.8* 37.7 37.1  MCV 95.9 98.4 102.2* 99.7  PLT 218 225 228 234   Cardiac Enzymes: No results for input(s): CKTOTAL, CKMB, CKMBINDEX, TROPONINI in the last 168 hours. BNP: Invalid input(s): POCBNP CBG:  Recent Labs Lab 04/16/17 1405 04/19/17 1050  GLUCAP 152* 130*   D-Dimer No results for  input(s): DDIMER in the last 72 hours. Hgb A1c No results for input(s): HGBA1C in the last 72 hours. Lipid Profile No results for input(s): CHOL, HDL, LDLCALC, TRIG, CHOLHDL, LDLDIRECT in the last 72 hours. Thyroid function studies No results for input(s): TSH, T4TOTAL, T3FREE, THYROIDAB in the last 72 hours.  Invalid input(s): FREET3 Anemia work up No results for input(s): VITAMINB12, FOLATE, FERRITIN, TIBC, IRON, RETICCTPCT in the last 72 hours. Urinalysis    Component Value Date/Time   COLORURINE YELLOW 03/26/2017 1027   APPEARANCEUR CLEAR 03/26/2017 1027   LABSPEC 1.009 03/26/2017 1027   PHURINE 5.0 03/26/2017 1027   GLUCOSEU NEGATIVE 03/26/2017 1027   HGBUR NEGATIVE 03/26/2017 1027   BILIRUBINUR NEGATIVE 03/26/2017 1027   KETONESUR NEGATIVE 03/26/2017 1027   PROTEINUR NEGATIVE 03/26/2017 1027   NITRITE NEGATIVE 03/26/2017 1027   LEUKOCYTESUR NEGATIVE 03/26/2017 1027   Sepsis Labs Invalid input(s): PROCALCITONIN,  WBC,  LACTICIDVEN Microbiology Recent Results (from the past 240 hour(s))  MRSA PCR Screening     Status: None   Collection Time: 04/14/17  1:30 AM  Result Value Ref Range Status   MRSA by PCR NEGATIVE NEGATIVE Final    Comment:        The GeneXpert MRSA Assay (FDA approved for NASAL specimens only), is one component of a comprehensive MRSA colonization surveillance program. It is not intended to diagnose MRSA infection nor to guide or monitor treatment for MRSA infections.      Time coordinating discharge: Over 30 minutes  SIGNED:   Alba Cory, MD  [facility] 04/21/2017, 5:18 PM Pager [phone]  If 7PM-7AM, please contact night-coverage [email or web address] Password [email or web address]

## 2017-05-08 DEATH — deceased

## 2018-02-16 IMAGING — RF DG SWALLOWING FUNCTION - NRPT [facility]
18 series · 24 of 24 positions shown · non-contrast
Comparison: none

[Series 1: cp_standard · 0.34mm/px · 1 of 61 frames shown (1 of 18)]
[frame 10/61]
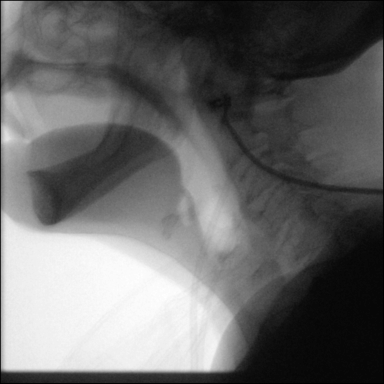

[Series 2: cp_standard · 0.34mm/px · 2 of 145 frames shown (2 of 18)]
[frame 22/145]
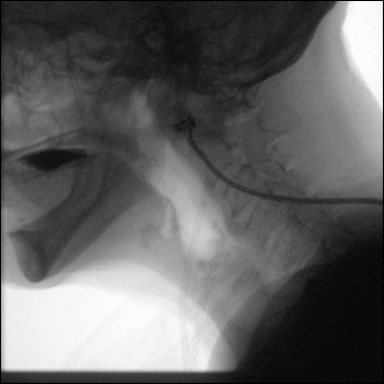
[frame 145/145]
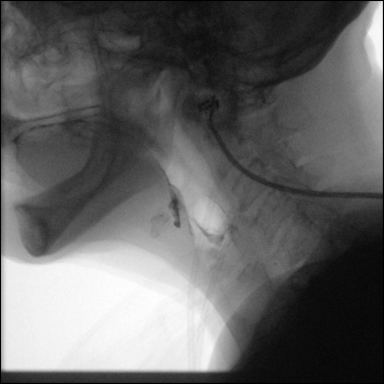

[Series 3: cp_standard · 0.34mm/px · 1 of 22 frames shown (3 of 18)]
[frame 14/22]
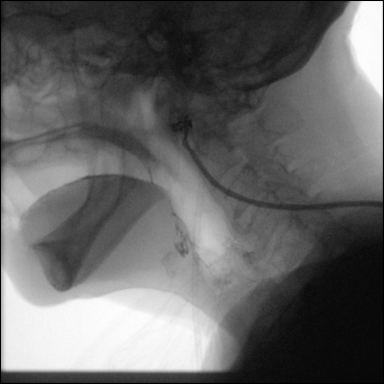

[Series 4: cp_standard · 0.34mm/px · 1 of 282 frames shown (4 of 18)]
[frame 138/282]
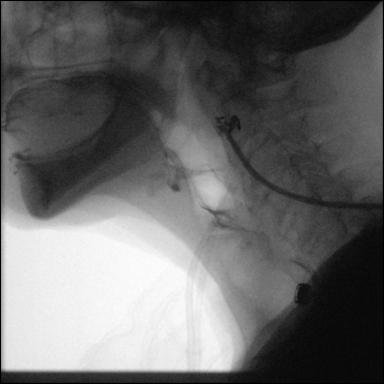

[Series 5: cp_standard · 0.34mm/px · 2 of 134 frames shown (5 of 18)]
[frame 21/134]
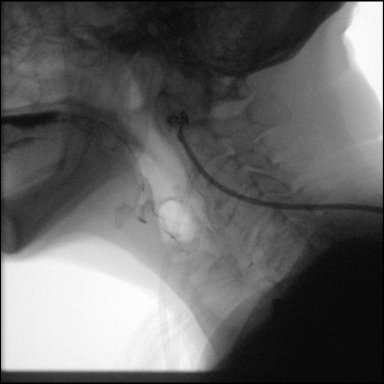
[frame 114/134]
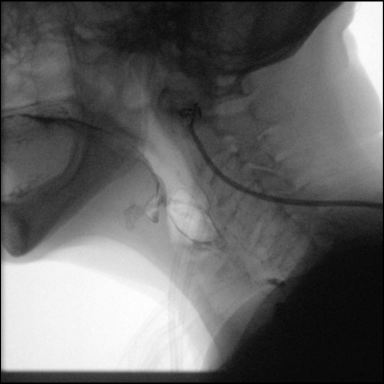

[Series 6: cp_standard · 0.34mm/px · 1 of 105 frames shown (6 of 18)]
[frame 53/105]
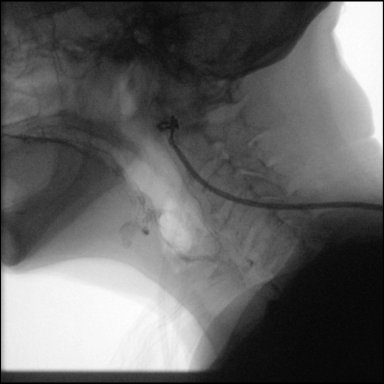

[Series 7: cp_standard · 0.34mm/px · 1 of 143 frames shown (7 of 18)]
[frame 22/143]
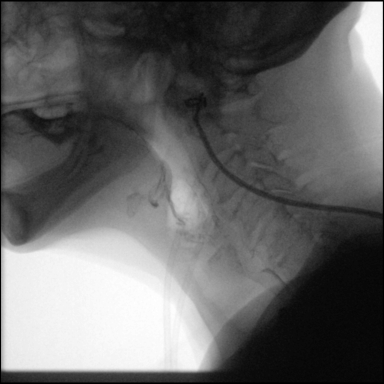

[Series 8: cp_standard · 0.34mm/px · 2 of 38 frames shown (8 of 18)]
[frame 1/38]
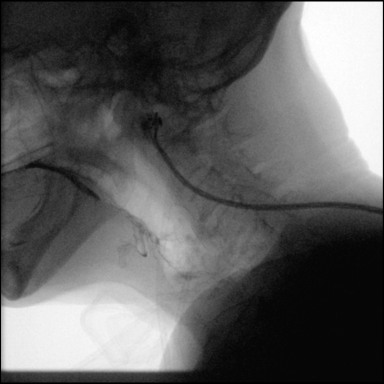
[frame 33/38]
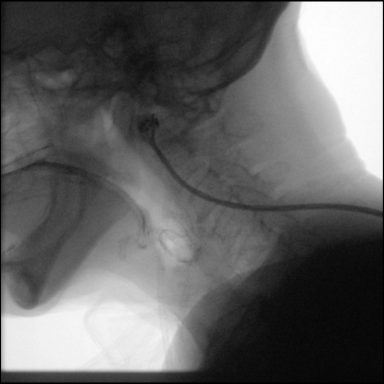

[Series 9: cp_standard · 0.34mm/px · 1 of 149 frames shown (9 of 18)]
[frame 127/149]
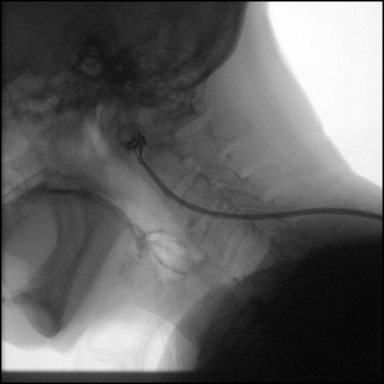

[Series 10: cp_standard · 0.34mm/px · 1 of 147 frames shown (10 of 18)]
[frame 74/147]
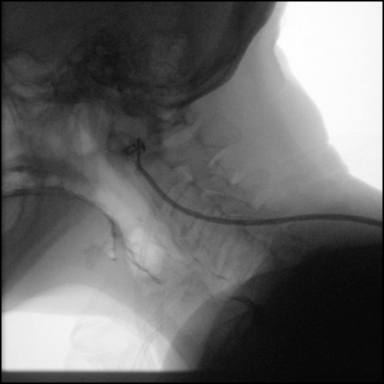

[Series 11: cp_standard · 0.34mm/px · 1 of 226 frames shown (11 of 18)]
[frame 114/226]
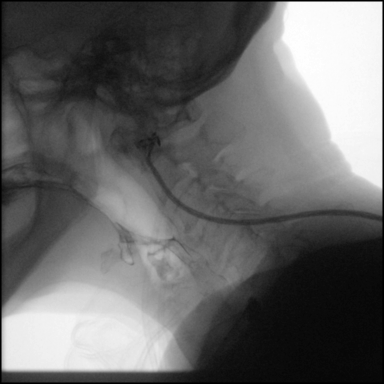

[Series 12: cp_standard · 0.34mm/px · 2 of 35 frames shown (12 of 18)]
[frame 6/35]
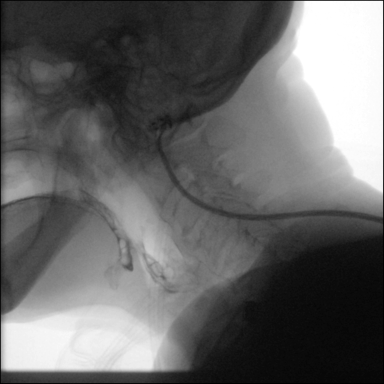
[frame 30/35]
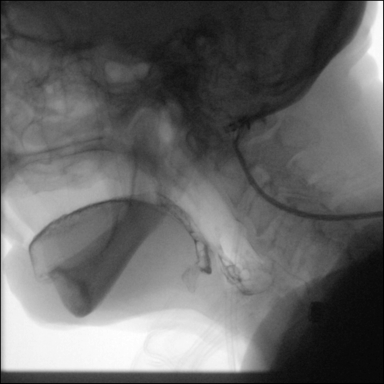

[Series 13: cp_standard · 0.34mm/px · 1 of 139 frames shown (13 of 18)]
[frame 102/139]
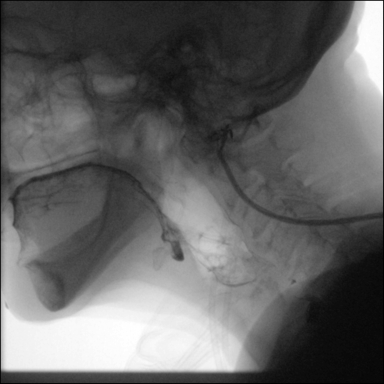

[Series 14: cp_standard · 0.34mm/px · 1 of 13 frames shown (14 of 18)]
[frame 7/13]
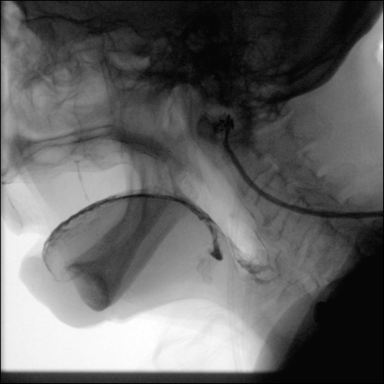

[Series 15: cp_standard · 0.34mm/px · 2 of 15 frames shown (15 of 18)]
[frame 1/15]
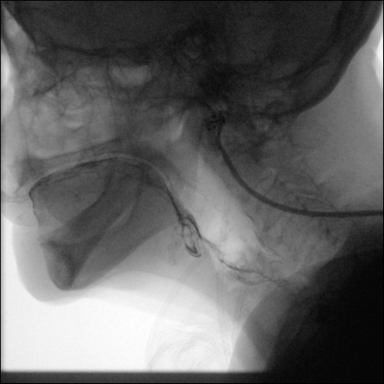
[frame 13/15]
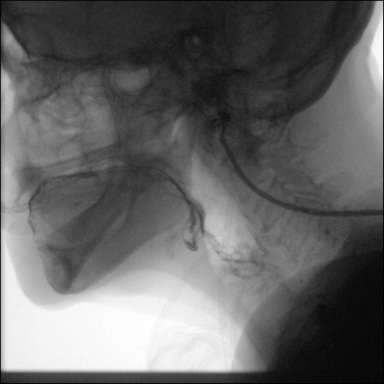

[Series 16: cp_standard · 0.34mm/px · 1 of 149 frames shown (16 of 18)]
[frame 75/149]
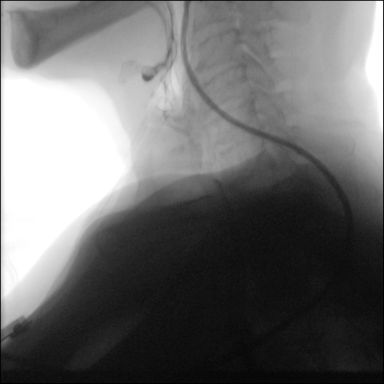

[Series 17: cp_standard · 0.34mm/px · 1 of 13 frames shown (17 of 18)]
[frame 7/13]
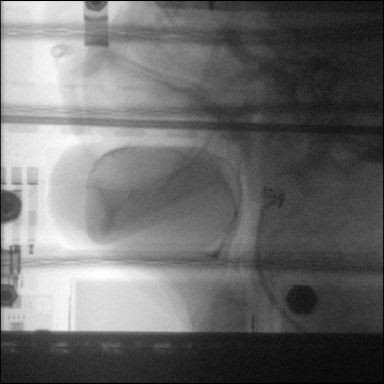

[Series 18: cp_standard · 0.34mm/px · 2 of 16 frames shown (18 of 18)]
[frame 3/16]
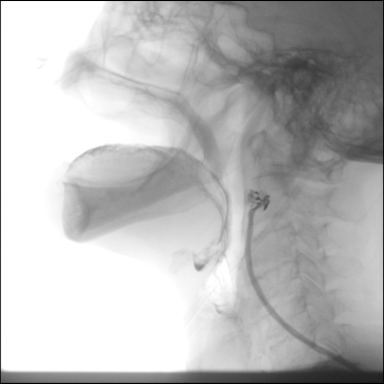
[frame 15/16]
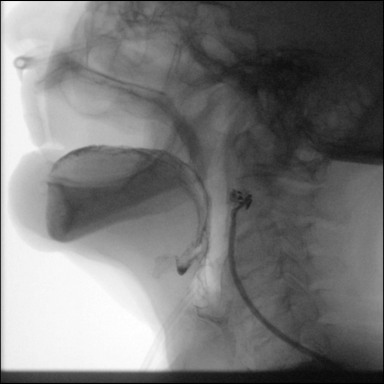

[24 of 24 positions shown; findings below may reference images not displayed]

FLUOROSCOPY FOR SWALLOWING FUNCTION STUDY:
Fluoroscopy was provided for swallowing function study, which was administered by a speech pathologist.  Final results and recommendations from this study are contained within the speech pathology report.
